# Patient Record
Sex: Male | Born: 1965 | Race: Black or African American | Hispanic: No | Marital: Single | Smoking: Current every day smoker
Health system: Southern US, Community
[De-identification: ages and names within clinical notes are randomized; demographics above are authoritative.]

## PROBLEM LIST (undated history)

## (undated) ENCOUNTER — Encounter

## (undated) DIAGNOSIS — E119 Type 2 diabetes mellitus without complications: Secondary | ICD-10-CM

## (undated) DIAGNOSIS — F319 Bipolar disorder, unspecified: Secondary | ICD-10-CM

## (undated) DIAGNOSIS — R45851 Suicidal ideations: Secondary | ICD-10-CM

## (undated) DIAGNOSIS — F329 Major depressive disorder, single episode, unspecified: Secondary | ICD-10-CM

## (undated) DIAGNOSIS — F1010 Alcohol abuse, uncomplicated: Secondary | ICD-10-CM

---

## 2007-12-18 ENCOUNTER — Emergency Department (HOSPITAL_COMMUNITY): Admission: EM | Admit: 2007-12-18 | Discharge: 2007-12-18 | Payer: Self-pay | Admitting: Emergency Medicine

## 2007-12-28 ENCOUNTER — Emergency Department (HOSPITAL_COMMUNITY): Admission: EM | Admit: 2007-12-28 | Discharge: 2007-12-28 | Payer: Self-pay | Admitting: Emergency Medicine

## 2013-01-22 ENCOUNTER — Inpatient Hospital Stay (HOSPITAL_COMMUNITY)
Admission: AD | Admit: 2013-01-22 | Discharge: 2013-01-25 | DRG: 897 | Disposition: A | Discharge status: Home / Self Care | Payer: Federal, State, Local not specified - Other | Source: Intra-hospital | Attending: Psychiatry | Admitting: Psychiatry

## 2013-01-22 ENCOUNTER — Emergency Department (HOSPITAL_COMMUNITY)
Admission: EM | Admit: 2013-01-22 | Discharge: 2013-01-22 | Disposition: A | Discharge status: Home / Self Care | Payer: Self-pay | Attending: Emergency Medicine | Admitting: Emergency Medicine

## 2013-01-22 ENCOUNTER — Encounter (HOSPITAL_COMMUNITY): Payer: Self-pay | Admitting: Emergency Medicine

## 2013-01-22 ENCOUNTER — Encounter (HOSPITAL_COMMUNITY): Payer: Self-pay | Admitting: *Deleted

## 2013-01-22 DIAGNOSIS — F331 Major depressive disorder, recurrent, moderate: Secondary | ICD-10-CM

## 2013-01-22 DIAGNOSIS — R45851 Suicidal ideations: Secondary | ICD-10-CM

## 2013-01-22 DIAGNOSIS — F17200 Nicotine dependence, unspecified, uncomplicated: Secondary | ICD-10-CM | POA: Insufficient documentation

## 2013-01-22 DIAGNOSIS — F14120 Cocaine abuse with intoxication, uncomplicated: Secondary | ICD-10-CM

## 2013-01-22 DIAGNOSIS — F1020 Alcohol dependence, uncomplicated: Principal | ICD-10-CM

## 2013-01-22 DIAGNOSIS — F1410 Cocaine abuse, uncomplicated: Secondary | ICD-10-CM

## 2013-01-22 DIAGNOSIS — F1994 Other psychoactive substance use, unspecified with psychoactive substance-induced mood disorder: Secondary | ICD-10-CM

## 2013-01-22 DIAGNOSIS — Z79899 Other long term (current) drug therapy: Secondary | ICD-10-CM

## 2013-01-22 DIAGNOSIS — F1420 Cocaine dependence, uncomplicated: Secondary | ICD-10-CM

## 2013-01-22 HISTORY — DX: Suicidal ideations: R45.851

## 2013-01-22 LAB — CBC
Platelets: 275 10*3/uL (ref 150–400)
RBC: 5.1 MIL/uL (ref 4.22–5.81)
WBC: 6.9 10*3/uL (ref 4.0–10.5)

## 2013-01-22 LAB — POCT I-STAT, CHEM 8
Chloride: 104 mEq/L (ref 96–112)
HCT: 47 % (ref 39.0–52.0)
Potassium: 3.5 mEq/L (ref 3.5–5.1)

## 2013-01-22 LAB — GLUCOSE, CAPILLARY: Glucose-Capillary: 83 mg/dL (ref 70–99)

## 2013-01-22 LAB — ETHANOL: Alcohol, Ethyl (B): 133 mg/dL — ABNORMAL HIGH (ref 0–11)

## 2013-01-22 LAB — RAPID URINE DRUG SCREEN, HOSP PERFORMED
Amphetamines: NOT DETECTED
Tetrahydrocannabinol: NOT DETECTED

## 2013-01-22 MED ORDER — ALUM & MAG HYDROXIDE-SIMETH 200-200-20 MG/5ML PO SUSP: 30.0000 mL | ORAL | Status: DC | PRN

## 2013-01-22 MED ORDER — HYDROXYZINE HCL 25 MG PO TABS: 25.0000 mg | ORAL_TABLET | Freq: Four times a day (QID) | ORAL | Status: DC | PRN

## 2013-01-22 MED ORDER — LOPERAMIDE HCL 2 MG PO CAPS: 2.0000 mg | ORAL_CAPSULE | ORAL | Status: DC | PRN

## 2013-01-22 MED ORDER — ADULT MULTIVITAMIN W/MINERALS CH
1.0000 | ORAL_TABLET | Freq: Every day | ORAL | Status: DC
  Administered 2013-01-23 – 2013-01-25 (×3): 1 via ORAL
  Filled 2013-01-22 (×5): qty 1

## 2013-01-22 MED ORDER — THIAMINE HCL 100 MG/ML IJ SOLN
100.0000 mg | Freq: Once | INTRAMUSCULAR | Status: AC
  Administered 2013-01-22: 100 mg via INTRAMUSCULAR

## 2013-01-22 MED ORDER — VITAMIN B-1 100 MG PO TABS
100.0000 mg | ORAL_TABLET | Freq: Every day | ORAL | Status: DC
  Administered 2013-01-23 – 2013-01-25 (×3): 100 mg via ORAL
  Filled 2013-01-22 (×5): qty 1

## 2013-01-22 MED ORDER — MAGNESIUM HYDROXIDE 400 MG/5ML PO SUSP: 30.0000 mL | Freq: Every day | ORAL | Status: DC | PRN

## 2013-01-22 MED ORDER — IBUPROFEN 200 MG PO TABS: 600.0000 mg | ORAL_TABLET | Freq: Three times a day (TID) | ORAL | Status: DC | PRN

## 2013-01-22 MED ORDER — ZOLPIDEM TARTRATE 5 MG PO TABS: 5.0000 mg | ORAL_TABLET | Freq: Every evening | ORAL | Status: DC | PRN

## 2013-01-22 MED ORDER — ACETAMINOPHEN 325 MG PO TABS: 650.0000 mg | ORAL_TABLET | Freq: Four times a day (QID) | ORAL | Status: DC | PRN

## 2013-01-22 MED ORDER — NICOTINE 21 MG/24HR TD PT24: 21.0000 mg | MEDICATED_PATCH | Freq: Every day | TRANSDERMAL | Status: DC

## 2013-01-22 MED ORDER — TRAZODONE HCL 50 MG PO TABS
50.0000 mg | ORAL_TABLET | Freq: Every evening | ORAL | Status: DC | PRN
  Administered 2013-01-22: 50 mg via ORAL
  Filled 2013-01-22 (×2): qty 1
  Filled 2013-01-22: qty 28
  Filled 2013-01-22: qty 1

## 2013-01-22 MED ORDER — ONDANSETRON 4 MG PO TBDP: 4.0000 mg | ORAL_TABLET | Freq: Four times a day (QID) | ORAL | Status: DC | PRN

## 2013-01-22 MED ORDER — ONDANSETRON HCL 4 MG PO TABS: 4.0000 mg | ORAL_TABLET | Freq: Three times a day (TID) | ORAL | Status: DC | PRN

## 2013-01-22 MED ORDER — ACETAMINOPHEN 325 MG PO TABS: 650.0000 mg | ORAL_TABLET | ORAL | Status: DC | PRN

## 2013-01-22 MED ORDER — CHLORDIAZEPOXIDE HCL 25 MG PO CAPS
25.0000 mg | ORAL_CAPSULE | Freq: Four times a day (QID) | ORAL | Status: DC | PRN
  Administered 2013-01-24: 25 mg via ORAL
  Filled 2013-01-22 (×4): qty 1

## 2013-01-22 MED ORDER — LORAZEPAM 1 MG PO TABS: 1.0000 mg | ORAL_TABLET | Freq: Three times a day (TID) | ORAL | Status: DC | PRN

## 2013-01-22 NOTE — ED Notes (Signed)
Pt cooperative at this time. States "I don't know what's wrong with me...sometimes I lose it...." States he has used ETOH and Cocaine today. Unable to tell me how much he drinks or does cocaine.

## 2013-01-22 NOTE — BH Assessment (Signed)
Assessment Note   [name] Kennedy is an 47 y.o. male that presented intoxicated and agitated, voicing plans, intent and attempt to harm himself by walking into traffic. Pt denies previous admissions for same but does admit "always feeling like killing myself, I just never did it."  Pt voices inability to contract for safety.  Pt admits to daily alcohol and Cocaine use "when I get my hands on some" and both drank "a few beers" and used Crack "I guess about ten dollars worth" today (BAL was .133).   Pt is not in current withdrawals as he is still legally intoxicated.  Pt reports agitation, confusion, restlessness, anxiety, and appears confused at times.  Pt denies any previous admissions for substance abuse "but I [place] place [place]."  Pt cannot identify a specific stressor that led to this attempt, but lives at the shelter and reports feeling hopeless and helpless with minimal support.  Pt reports a family history of substance abuse, but no family supports.  Pt denies any HI or any active psychosis.  Pt denies previous treatment in an inpatient facility but is seeking inpatient help today.  Pt states that his appetite is fair and his sleep is disturbed.     Axis I: Substance Induced Mood Disorder Axis II: Deferred Axis III:  Past Medical History  Diagnosis Date  . Suicidal ideations    Axis IV: housing problems, other psychosocial or environmental problems, problems related to social environment and problems with primary support group Axis V: 21-30 behavior considerably influenced by delusions or hallucinations OR serious impairment in judgment, communication OR inability to function in almost all areas  Past Medical History:  Past Medical History  Diagnosis Date  . Suicidal ideations     History reviewed. No pertinent past surgical history.  Family History: No family history on file.  Social History:  reports that he has been smoking.  He does not have any smokeless  tobacco history on file. He reports that  drinks alcohol. He reports that he uses illicit drugs (Cocaine).  Additional Social History:  Alcohol / Drug Use Pain Medications: See MAR Prescriptions: See MAR Over the Counter: yes; prn See MAR History of alcohol / drug use?: Yes (reports daily use of alcohol and Cocaine when he can get it) Longest period of sobriety (when/how long): unknown; pt won't divulge Negative Consequences of Use: Financial;Personal relationships;Work / School Withdrawal Symptoms: Agitation;Irritability;Patient aware of relationship between substance abuse and physical/medical complications Substance #1 Name of Substance 1: ETOH  CIWA: CIWA-Ar BP: 112/62 mmHg Pulse Rate: 84 COWS:    Allergies: No Known Allergies  Home Medications:  (Not in a hospital admission)  OB/GYN Status:  No LMP for male patient.  General Assessment Data Location of Assessment: [facility] ED Living Arrangements: Other (Comment) (resides at the [facility]; contacted by nursing staff) Can pt return to current living arrangement?: Yes Admission Status: Voluntary Is patient capable of signing voluntary admission?: Yes Transfer from: Acute Hospital Referral Source: Self/Family/Friend  Education Status Is patient currently in school?: No  Risk to self Suicidal Ideation: Yes-Currently Present Suicidal Intent: Yes-Currently Present Is patient at risk for suicide?: Yes Suicidal Plan?: Yes-Currently Present Specify Current Suicidal Plan: reports waling in front of traffic today Access to Means: Yes Specify Access to Suicidal Means: vehicles available What has been your use of drugs/alcohol within the last 12 months?: reports daily use of alcohol and Cocaine "when I can get it" Previous Attempts/Gestures: No How many  times?: 0 Other Self Harm Risks: impulsive and reckless Triggers for Past Attempts: Other personal contacts;Unpredictable Intentional Self Injurious Behavior: Damaging Comment -  Self Injurious Behavior: ongoing substance abuse use Family Suicide History: No Recent stressful life event(s): Conflict (Comment);Trauma (Comment) ("I have always felt suicidal; I just don't want to live") Persecutory voices/beliefs?: Yes Depression: Yes Depression Symptoms: Loss of interest in usual pleasures;Feeling worthless/self pity;Feeling angry/irritable Substance abuse history and/or treatment for substance abuse?: Yes Suicide prevention information given to non-admitted patients: Not applicable  Risk to Others Homicidal Ideation: No Thoughts of Harm to Others: No Current Homicidal Intent: No Current Homicidal Plan: No Access to Homicidal Means: No Identified Victim: Pt denies, though was hostile upon admission History of harm to others?: No Assessment of Violence: In distant past Violent Behavior Description: "defending myself" Does patient have access to weapons?: No Criminal Charges Pending?: No Does patient have a court date: No  Psychosis Hallucinations: None noted Delusions: None noted  Mental Status Report Appear/Hygiene: Disheveled Eye Contact: Fair Motor Activity: Unremarkable Speech: Loud Level of Consciousness: Alert Mood: Depressed;Labile;Angry;Worthless, low self-esteem Affect: Appropriate to circumstance;Blunted;Depressed Anxiety Level: Moderate Thought Processes: Relevant Judgement: Impaired Orientation: Person;Place;Time;Situation Obsessive Compulsive Thoughts/Behaviors: Severe  Cognitive Functioning Concentration: Decreased Memory: Recent Impaired;Remote Impaired IQ: Average Insight: Poor Impulse Control: Poor Appetite: Fair Weight Loss: 0 Weight Gain: 0 Sleep: Decreased Total Hours of Sleep:  (< 3 hrs ) Vegetative Symptoms: None  ADLScreening [facility] Assessment Services) Patient's cognitive ability adequate to safely complete daily activities?: Yes Patient able to express need for assistance with ADLs?: Yes Independently performs ADLs?:  Yes (appropriate for developmental age)  Abuse/Neglect [facility]) Physical Abuse: Denies Verbal Abuse: Denies Sexual Abuse: Denies  Prior Inpatient Therapy Prior Inpatient Therapy: No Prior Therapy Dates: pt denies Prior Therapy Facilty/Provider(s): pt denies Reason for Treatment: pt denies  Prior Outpatient Therapy Prior Outpatient Therapy: Yes Prior Therapy Dates: "can't remember" Prior Therapy Facilty/Provider(s): [facility] Reason for Treatment: substance abuse and mental health related issues  ADL Screening (condition at time of admission) Patient's cognitive ability adequate to safely complete daily activities?: Yes Patient able to express need for assistance with ADLs?: Yes Independently performs ADLs?: Yes (appropriate for developmental age) Weakness of Legs: None       Abuse/Neglect Assessment (Assessment to be complete while patient is alone) Physical Abuse: Denies Verbal Abuse: Denies Sexual Abuse: Denies Exploitation of patient/patient's resources: Denies Self-Neglect: Denies     Merchant navy officer (For Healthcare) Advance Directive: Patient does not have advance directive    Additional Information 1:1 In Past 12 Months?: No CIRT Risk: No Elopement Risk: Yes Does patient have medical clearance?: Yes     Disposition:  Please consider for inpatient treatment to address SI and substance abuse related issues.   Disposition Initial Assessment Completed for this Encounter: Yes Disposition of Patient: Inpatient treatment program  On Site Evaluation by:   Reviewed with Physician:     Angelica Ran 01/22/2013 4:41 PM

## 2013-01-22 NOTE — BHH Counselor (Signed)
[name] Kennedy, assessment counselor at [facility], submitted Pt for admission to [facility]. [name], [facility] confirmed bed availability. Gave clinical report to [name], NP who accepted Pt to Dr. [name], room [phone].  [name], LPC, [facility] Assessment Counselor

## 2013-01-22 NOTE — ED Notes (Signed)
Report given to Bonnie, Charity fundraiser. Moved to POD C, rm 20

## 2013-01-22 NOTE — ED Notes (Signed)
Charge nurse notified of need for sitter.

## 2013-01-22 NOTE — ED Notes (Signed)
House coverage called for a sitter  

## 2013-01-22 NOTE — ED Notes (Signed)
When i ask pt. What could i help him with he just nodded, saying nothing
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
## 2013-01-22 NOTE — ED Notes (Signed)
Sitter to room.  PA in to see pt.

## 2013-01-22 NOTE — ED Provider Notes (Signed)
Medical screening examination/treatment/procedure(s) were conducted as a shared visit with non-physician practitioner(s) and myself.  I personally evaluated the patient during the encounter  Derwood Kaplan, MD 01/22/13 1623
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
## 2013-01-22 NOTE — ED Notes (Signed)
Spoke with Loma Sender at [employer]. States they will hold pt's belongings for a while.

## 2013-01-22 NOTE — BH Assessment (Signed)
BHH Assessment Progress Note      The patient has been accepted by Serena Colonel to [facility] [employer] at [facility] room 305-1. He will be transported by security.  No providers to notify.  Support paperwork complete.  ED staff notified.  Pt will be transported by security.

## 2013-01-22 NOTE — ED Notes (Signed)
Pt returned to ED. Placed in FT 7.

## 2013-01-22 NOTE — ED Notes (Addendum)
Wallet, keys, cell phone and cards to safe with security. Pt in blue scrubs. Clothing to nurses desk.

## 2013-01-22 NOTE — ED Notes (Signed)
Pt. Returned they found him around vascular lab and was asking for some help.  Pt. Came back to ER, i explained to him we are here to help you, but we have to follow our policies and procedures and he has to be wanded and dress in our scrubs.  He agreed and continued to state, all this stuff going on in my head.

## 2013-01-22 NOTE — ED Notes (Signed)
"   You want me to get the fuck out of here?" Stood up and walked out.

## 2013-01-22 NOTE — ED Notes (Signed)
Pt refusing to change into scrubs with security present.

## 2013-01-22 NOTE — ED Notes (Signed)
Pt offered Ativan and Nicotine patch but declined at this time.

## 2013-01-23 DIAGNOSIS — R45851 Suicidal ideations: Secondary | ICD-10-CM

## 2013-01-23 DIAGNOSIS — F1410 Cocaine abuse, uncomplicated: Secondary | ICD-10-CM

## 2013-01-23 DIAGNOSIS — F332 Major depressive disorder, recurrent severe without psychotic features: Secondary | ICD-10-CM

## 2013-01-23 LAB — GLUCOSE, CAPILLARY: Glucose-Capillary: 109 mg/dL — ABNORMAL HIGH (ref 70–99)

## 2013-01-23 MED ORDER — QUETIAPINE FUMARATE 50 MG PO TABS
ORAL_TABLET | ORAL | Status: AC
  Administered 2013-01-23: 19:00:00
  Filled 2013-01-23: qty 1

## 2013-01-23 MED ORDER — QUETIAPINE FUMARATE 50 MG PO TABS
50.0000 mg | ORAL_TABLET | Freq: Once | ORAL | Status: AC
  Administered 2013-01-23: 50 mg via ORAL
  Filled 2013-01-23: qty 1

## 2013-01-23 MED ORDER — FLUOXETINE HCL 20 MG PO CAPS
20.0000 mg | ORAL_CAPSULE | Freq: Every day | ORAL | Status: DC
  Administered 2013-01-23 – 2013-01-25 (×3): 20 mg via ORAL
  Filled 2013-01-23 (×4): qty 1
  Filled 2013-01-23: qty 14

## 2013-01-23 NOTE — Progress Notes (Signed)
Adult Psychoeducational Group Note  Date:  01/23/2013 Time:  1315  Group Topic/Focus:  Recovery Goals:   The focus of this group is to identify appropriate goals for recovery and establish a plan to achieve them.  Participation Level:  Did Not Attend. Pt decided not to attend group.   Barbette Merino, Ireanna Finlayson Shari Prows 01/23/2013, 6:25 PM

## 2013-01-23 NOTE — H&P (Signed)
Psychiatric Admission Assessment Adult  Patient Identification:  [name] Kennedy Date of Evaluation:  01/23/2013 Chief Complaint:  SUBSTANCE INDUCED MOOD D/O History of Present Illness: Patient accepted on transfer from [facility] where he presented stating he was depressed and had tried to kill himself by walking into traffic but "it didn't work out." He reported a long history of mental illness, substance abuse including drinking both beer and liquor as well as using cocaine. He forwarded little information. He was given medical clearance and transferred to [facility] for stabilization and treatment. Elements:  Location:  adult in patient. Quality:  moderate. Severity:  chronic. Timing:  years. Duration:  "I don't know.". Associated Signs/Synptoms: Depression Symptoms:  depressed mood, suicidal thoughts with specific plan, suicidal attempt, (Hypo) Manic Symptoms:  denies Anxiety Symptoms:  denies Psychotic Symptoms:  denies PTSD Symptoms: NA  Psychiatric Specialty Exam: Physical Exam  Constitutional: He appears well-developed and well-nourished.  Chart reviewed, patient seen. No further Physical needed at this time.  Psychiatric: His affect is angry and blunt. His speech is delayed. He is agitated and withdrawn. Thought content is paranoid. Thought content is not delusional. Cognition and memory are impaired. He expresses impulsivity and inappropriate judgment. He exhibits a depressed mood. He expresses suicidal ideation. He expresses no homicidal ideation. He expresses no suicidal plans and no homicidal plans. He exhibits abnormal recent memory and abnormal remote memory.  Patient has a flat affect, poor eye contact, minimal speech, states "I don't know" to most questions.    Review of Systems  Constitutional: Negative.  Negative for fever, chills, weight loss, malaise/fatigue and diaphoresis.  HENT: Negative for congestion and sore throat.   Eyes: Negative for blurred vision, double vision and  photophobia.  Respiratory: Negative for cough, shortness of breath and wheezing.   Cardiovascular: Negative for chest pain, palpitations and PND.  Gastrointestinal: Negative for heartburn, nausea, vomiting, abdominal pain, diarrhea and constipation.  Musculoskeletal: Negative for myalgias, joint pain and falls.  Neurological: Negative for dizziness, tingling, tremors, speech change, focal weakness, seizures, loss of consciousness, weakness and headaches. Sensory change: hands and feet tingle.  Endo/Heme/Allergies: Negative for polydipsia. Does not bruise/bleed easily.  Psychiatric/Behavioral: Negative for depression, suicidal ideas, hallucinations, memory loss and substance abuse. The patient is not nervous/anxious and does not have insomnia.     Blood pressure 129/84, pulse 79, temperature 97.8 F (36.6 C), temperature source Oral, resp. rate 16, height 6' (1.829 m), weight 70.308 kg (155 lb).Body mass index is 21.02 kg/(m^2).  General Appearance: Disheveled  Eye Contact::  Poor  Speech:  Blocked and delayed and minimal  Volume:  Normal  Mood:  Depressed  Affect:  Blunt and Flat  Thought Process:  Goal Directed  Orientation:  Full (Time, Place, and Person)  Thought Content:  NA  Suicidal Thoughts:  Yes.  without intent/plan  Homicidal Thoughts:  No  Memory:  Immediate;   Poor  Judgement:  Impaired  Insight:  Lacking  Psychomotor Activity:  Normal  Concentration:  Poor  Recall:  Poor  Akathisia:  No  Handed:  Right  AIMS (if indicated):     Assets:  Physical Health  Sleep:       Past Psychiatric History: Diagnosis:  Hospitalizations:  Outpatient Care:  Substance Abuse Care:  Self-Mutilation:  Suicidal Attempts:  Violent Behaviors:   Past Medical History:   Past Medical History  Diagnosis Date  . Suicidal ideations    None. Allergies:  No Known Allergies PTA Medications: No prescriptions prior to admission  Previous Psychotropic Medications:  Medication/Dose                  Substance Abuse History in the last 12 months:  yes  Consequences of Substance Abuse: Medical Consequences:  worsening health  Social History:  reports that he has been smoking.  He does not have any smokeless tobacco history on file. He reports that  drinks alcohol. He reports that he uses illicit drugs (Cocaine). Additional Social History: History of alcohol / drug use?: Yes Negative Consequences of Use: Engineer, maintenance of Residence:   Place of Birth:   Family Members: Marital Status:  Single Children:  Sons:  Daughters: Relationships: Education:  [employer] Problems/Performance: Religious Beliefs/Practices: History of Abuse (Emotional/Phsycial/Sexual) Occupational Experiences; Military History:  None. Legal History: Hobbies/Interests:  Family History:  No family history on file.  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 Suicidal ideations    AXIS IV:  economic problems, educational problems, housing problems, occupational problems, problems related to legal system/crime, problems related to social environment and problems with primary support group AXIS V:  41-50 serious symptoms  Treatment Plan/Recommendations:  1. Admit for crisis management and stabilization. 2. Medication management to reduce current symptoms to base line and  improve the patient's overall level of functioning. 3. Treat health problems as indicated. 4. Develop treatment plan to decrease risk of relapse upon discharge and to reduce the need for readmission. 5. Psycho-social education regarding relapse prevention and self care. 6. Health care follow up as needed for medical problems. 7. Restart home medications where appropriate.  Treatment Plan Summary: Daily contact with patient to assess and evaluate symptoms and progress in treatment Medication management Current Medications:  Current Facility-Administered Medications  Medication Dose Route Frequency Provider Last Rate Last Dose  . acetaminophen (TYLENOL) tablet 650 mg  650 mg Oral Q6H PRN Sanjuana Kava, NP      . alum & mag hydroxide-simeth (MAALOX/MYLANTA) 200-200-20 MG/5ML suspension 30 mL  30 mL Oral Q4H PRN Sanjuana Kava, NP      . chlordiazePOXIDE (LIBRIUM) capsule 25 mg  25 mg Oral Q6H PRN Sanjuana Kava, NP      . hydrOXYzine (ATARAX/VISTARIL) tablet 25 mg  25 mg Oral Q6H PRN Sanjuana Kava, NP      . loperamide (IMODIUM) capsule 2-4 mg  2-4 mg Oral PRN Sanjuana Kava, NP      . magnesium hydroxide (MILK OF MAGNESIA) suspension 30 mL  30 mL Oral Daily PRN Sanjuana Kava, NP      . multivitamin with minerals tablet 1 tablet  1 tablet Oral Daily Sanjuana Kava, NP   1 tablet  at 01/23/13 0813  . ondansetron (ZOFRAN-ODT) disintegrating tablet 4 mg  4 mg Oral Q6H PRN Sanjuana Kava, NP      . thiamine (VITAMIN B-1) tablet 100 mg  100 mg Oral Daily Sanjuana Kava, NP   100 mg at 01/23/13 0813  . traZODone (DESYREL) tablet 50 mg  50 mg Oral QHS PRN,MR X 1 Sanjuana Kava, NP   50 mg at 01/22/13 2326    Observation Level/Precautions:  Detox  Laboratory:    Psychotherapy:  Individual and group   Medications:  Librium detox  Consultations:  As needed  Discharge Concerns:  Follow up  Estimated LOS: 3-5 days  Other:     I certify that inpatient services furnished can reasonably be expected to  improve the patient's condition.   Rona Ravens. [place] RPAC 12:18 PM 01/23/2013  Patient is personally examined, case discussed with physician extender and developed treatment plan. Reviewed the information documented and agree with the treatment plan.Darrol Jump R. 01/23/2013 7:03 PM

## 2013-01-23 NOTE — Progress Notes (Signed)
D.  Pt did not attend evening AA group, went to room and to bed.  He had received 50 mg of Seroquel for agitation at 1900 and was too sleepy to attend.  Minimal interaction with Pt due to this.  A.  Will continue to monitor  R.  Pt remains safe on unit, laying in bed with even and unlabored respirations.

## 2013-01-23 NOTE — [facility] Suicide Risk Assessment (Signed)
Suicide Risk Assessment  Admission Assessment     Nursing information obtained from:  Patient Demographic factors:  Male;Low socioeconomic status;Living alone;Unemployed Current Mental Status:  Suicide plan;Self-harm thoughts Loss Factors:  Financial problems / change in socioeconomic status Historical Factors:    Risk Reduction Factors:     CLINICAL FACTORS:   Severe Anxiety and/or Agitation Panic Attacks Depression:   Aggression Comorbid alcohol abuse/dependence Impulsivity Insomnia Recent sense of peace/wellbeing Severe Alcohol/Substance Abuse/Dependencies More than one psychiatric diagnosis Previous Psychiatric Diagnoses and Treatments  COGNITIVE FEATURES THAT CONTRIBUTE TO RISK:  Closed-mindedness Loss of executive function Polarized thinking    SUICIDE RISK:  Moderate:  Frequent suicidal ideation with limited intensity, and duration, some specificity in terms of plans, no associated intent, good self-control, limited dysphoria/symptomatology, some risk factors present, and identifiable protective factors, including available and accessible social support.  PLAN OF CARE: Admit to [facility] for depression suicidal ideation with the plan of running in front of the car and substance abuse was dependence especially alcohol and cocaine. Patient will participate inpatient detox treatment program. Patient's safety will be monitored through out the program.   I certify that inpatient services furnished can reasonably be expected to improve the patient's condition.  Laketia Vicknair,JANARDHAHA R. 01/23/2013, 2:37 PM

## 2013-01-23 NOTE — Progress Notes (Addendum)
Patient ID: [name] Kennedy, male   DOB: 25-Apr-1966, 47 y.o.   MRN: [id number] D: Pt is awake and active on the unit this AM. Pt endorses passive SI but he is able to contract for safety. Pt rates their depression at 8 and hopelessness at 4. Pt's most recent CIWA score was 3. Pt mood is depressed and his affect is blunted. Pt forwards little and is resistant to staff interaction. He is isolative and not attending groups at this time.   A: Encouraged pt to discuss feelings with staff and administered medication per MD orders. Writer also encouraged pt to participate in groups.  R: Pt is attending groups and tolerating medications well. Writer will continue to monitor. 15 minute checks are ongoing for safety.

## 2013-01-23 NOTE — [facility] Group Notes (Signed)
[facility] LCSW Group Therapy  01/23/2013   Type of Therapy:  Group Therapy 10 AM to 11 AM  Participation Level:  Did Not Attend  [name] Kennedy 01/23/2013, 12:23 PM

## 2013-01-23 NOTE — Progress Notes (Signed)
Psychoeducational Group Note  Date:  01/23/2013 Time:  0945 am  Group Topic/Focus:  Identifying Needs:   The focus of this group is to help patients identify their personal needs that have been historically problematic and identify healthy behaviors to address their needs.  Participation Level:  Did Not Attend  Andrena Mews 01/23/2013,10:25 AM
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
## 2013-01-23 NOTE — Progress Notes (Signed)
Patient did not attend the evening speaker AA meeting.  Pt returned to his room just as the meeting began. Pt remained in bed and fell asleep during the meeting.

## 2013-01-23 NOTE — Progress Notes (Signed)
Patient ID: [name] Kennedy, male   DOB: 09-14-1965, 47 y.o.   MRN: [id number] D: Patient is a new admissionIs bed awake. Pt mood/affect is depressed and flat. Pt endorses passive succide ideation but contract for safety.  Pt denies any withdrawals. Pt denies SI/HI/AVH and pain. Cooperative with assessment. No acute distressed noted at this time.   A: Met with pt 1:1. Medications administered as prescribed. Writer encouraged pt to discuss feelings. Pt encouraged to come to staff with any question or concerns. 15 minutes checks for safety.  R: Patient remains safe. He is complaint with medications. Continue current POC.

## 2013-01-24 DIAGNOSIS — F331 Major depressive disorder, recurrent, moderate: Secondary | ICD-10-CM

## 2013-01-24 DIAGNOSIS — F1020 Alcohol dependence, uncomplicated: Secondary | ICD-10-CM

## 2013-01-24 DIAGNOSIS — F1420 Cocaine dependence, uncomplicated: Secondary | ICD-10-CM

## 2013-01-24 DIAGNOSIS — F329 Major depressive disorder, single episode, unspecified: Secondary | ICD-10-CM

## 2013-01-24 NOTE — Progress Notes (Signed)
D   Pt has isolated to his room most of the day  He doesn't interact with others and very minimally with staff   He appears a little calmer but remains irritable  He has been able to control his behavior A   Verbal support given   Medications administered and effectiveness monitored   Q 15 min checks R   Pt safe at present

## 2013-01-24 NOTE — Progress Notes (Signed)
Patient did not attend the evening speaker AA meeting.  

## 2013-01-24 NOTE — Progress Notes (Addendum)
D.  Pt has been highly upset and agitated this evening, largely during phone conversations.  Pt very distressed over the fact that he is illiterate and did not feel that his voluntary commitment papers were properly explained to him before he signed them.  Pt states that he will be going home tomorrow.  Pt denies SI/HI/hallucinations at this time.  Did not attend evening AA group due to agitation.  Pt did not wish to take any medication at this time.  Pt did spend time talking to MHT on hall about his situation.  Pt is in the process of attempting to get disability   A.  Support and encouragement offered  R.  Pt remains safe on unit, will continue to monitor.

## 2013-01-24 NOTE — Progress Notes (Signed)
Psychoeducational Group Note  Date:  01/24/2013 Time:  0945 am  Group Topic/Focus:  Making Healthy Choices:   The focus of this group is to help patients identify negative/unhealthy choices they were using prior to admission and identify positive/healthier coping strategies to replace them upon discharge.  Participation Level:  Did Not Attend  Yiannis Tulloch J 01/24/2013, 10:29 AM 

## 2013-01-24 NOTE — Progress Notes (Signed)
[name] Progress Note  01/24/2013 2:10 PM [name] Kennedy  MRN:  [id number]  Subjective:  "I don't know why I got to be repeating myself over and over to you guys. Don't you get it? I have suicide thoughts all the time. Yes, I use alcohol, cocaine and everything else. I ain't attending no groups".  O: [name] appears difficult to work with. He is currently very difficult to approach. Wearing his partially shaved hair in dread locs.  Diagnosis:   Axis I: Alcohol dependence, MDD, [place] dependence Axis II: Deferred Axis III:  Past Medical History  Diagnosis Date  . Suicidal ideations    Axis IV: Substance abuse issues Axis V: 11-20 some danger of hurting self or others possible OR occasionally fails to maintain minimal personal hygiene OR gross impairment in communication  ADL's:  Impaired  Sleep: Fair  Appetite:  "I don't know"  Suicidal Ideation:  Plan:  Yes Intent:  No Means:  No Homicidal Ideation:  Plan:  No Intent:  No Means:  No AEB (as evidenced by):  Psychiatric Specialty Exam: Review of Systems  Constitutional: Negative.   HENT: Negative.   Eyes: Negative.   Cardiovascular: Negative.   Gastrointestinal: Negative.   Genitourinary: Negative.   Musculoskeletal: Negative.   Skin: Negative.        Hair in dreads, half-shaved  Neurological: Positive for tremors.  Endo/Heme/Allergies: Negative.   Psychiatric/Behavioral: Positive for depression, suicidal ideas and substance abuse (Alcoholism, coacaine dependence). Negative for hallucinations and memory loss. The patient is nervous/anxious and has insomnia.     Blood pressure 127/79, pulse 82, temperature 98.6 F (37 C), temperature source Oral, resp. rate 16, height 6' (1.829 m), weight 70.308 kg (155 lb).Body mass index is 21.02 kg/(m^2).  General Appearance: Disheveled  Eye Contact::  Poor  Speech:  Snappy  Volume:  Decreased  Mood:  Angry, Anxious, Depressed and Irritable  Affect:  Restricted  Thought  Process:  Difficulty to assess due to irritability  Orientation:  Full (Time, Place, and Person)  Thought Content:  Rumination  Suicidal Thoughts:  Yes.  without intent/plan  Homicidal Thoughts:  No  Memory:  Unable to assess due to irritability  Judgement:  Impaired  Insight:  Lacking  Psychomotor Activity:  Restlessness  Concentration:  Poor  Recall:  Fair  Akathisia:  No  Handed:  Right  AIMS (if indicated):     Assets:  Others:  unable to assess due to irritability  Sleep:  Number of Hours: 5.5   Current Medications: Current Facility-Administered Medications  Medication Dose Route Frequency Provider Last Rate Last Dose  . acetaminophen (TYLENOL) tablet 650 mg  650 mg Oral Q6H PRN Sanjuana Kava, NP      . alum & mag hydroxide-simeth (MAALOX/MYLANTA) 200-200-20 MG/5ML suspension 30 mL  30 mL Oral Q4H PRN Sanjuana Kava, NP      . chlordiazePOXIDE (LIBRIUM) capsule 25 mg  25 mg Oral Q6H PRN Sanjuana Kava, NP   25 mg at 01/24/13 0816  . FLUoxetine (PROZAC) capsule 20 mg  20 mg Oral Daily Verne Spurr, PA-C   20 mg at 01/24/13 2130  . hydrOXYzine (ATARAX/VISTARIL) tablet 25 mg  25 mg Oral Q6H PRN Sanjuana Kava, NP      . loperamide (IMODIUM) capsule 2-4 mg  2-4 mg Oral PRN Sanjuana Kava, NP      . magnesium hydroxide (MILK OF MAGNESIA) suspension 30 mL  30 mL Oral Daily PRN Sanjuana Kava, NP      .  multivitamin with minerals tablet 1 tablet  1 tablet Oral Daily Sanjuana Kava, NP   1 tablet at 01/24/13 0816  . ondansetron (ZOFRAN-ODT) disintegrating tablet 4 mg  4 mg Oral Q6H PRN Sanjuana Kava, NP      . thiamine (VITAMIN B-1) tablet 100 mg  100 mg Oral Daily Sanjuana Kava, NP   100 mg at 01/24/13 0816  . traZODone (DESYREL) tablet 50 mg  50 mg Oral QHS PRN,MR X 1 Sanjuana Kava, NP   50 mg at 01/22/13 2326    Lab Results:  Results for orders placed during the hospital encounter of 01/22/13 (from the past 48 hour(s))  GLUCOSE, CAPILLARY     Status: None   Collection Time    01/22/13   9:30 PM      Result Value Range   Glucose-Capillary 83  70 - 99 mg/dL  GLUCOSE, CAPILLARY     Status: Abnormal   Collection Time    01/23/13  6:15 AM      Result Value Range   Glucose-Capillary 109 (*) 70 - 99 mg/dL    Physical Findings: AIMS:  , ,  ,  ,    CIWA:  CIWA-Ar Total: 0 COWS:     Treatment Plan Summary: Daily contact with patient to assess and evaluate symptoms and progress in treatment Medication management  Plan:  Supportive approach/coping skills/relapse prevention. Encouraged out of room, participation in group sessions and application of coping skills when distressed. Will continue to monitor response to/adverse effects of medications in use to assure effectiveness. Continue to monitor mood, behavior and interaction with staff and other patients. Continue current plan of care.  Medical Decision Making Problem Points:  Established problem, stable/improving (1), Review of last therapy session (1) and Review of psycho-social stressors (1) Data Points:  Review of medication regiment & side effects (2) Review of new medications or change in dosage (2)  I certify that inpatient services furnished can reasonably be expected to improve the patient's condition.   Sanjuana Kava, FNP, PMHNP-BC 01/24/2013, 2:10 PM

## 2013-01-24 NOTE — [facility] Group Notes (Signed)
[facility] LCSW Group Therapy  01/24/2013  10:00 AM   Type of Therapy:  Group Therapy  Participation Level:  Did Not Attend  Alaiah Lundy Horton, LCSWA 01/24/2013 10:55 AM    

## 2013-01-25 DIAGNOSIS — F331 Major depressive disorder, recurrent, moderate: Secondary | ICD-10-CM

## 2013-01-25 MED ORDER — TRAZODONE HCL 50 MG PO TABS
50.0000 mg | ORAL_TABLET | Freq: Every evening | ORAL | Status: DC | PRN
Start: 2013-01-25 — End: 2013-05-04

## 2013-01-25 MED ORDER — FLUOXETINE HCL 20 MG PO CAPS: 20.0000 mg | ORAL_CAPSULE | Freq: Every day | ORAL | Status: DC

## 2013-01-25 NOTE — B[facility] Suicide Risk Assessment (Signed)
Suicide Risk Assessment  Discharge Assessment     Demographic Factors:  Male, Adolescent or young adult, Low socioeconomic status and Unemployed  Mental Status Per Nursing Assessment::   On Admission:  Suicide plan;Self-harm thoughts  Current Mental Status by Physician: Mental Status Examination: Patient appeared as per his stated age, casually dressed, and fairly groomed, and maintaining good eye contact. Patient has good mood and his affect was constricted. He has normal rate, rhythm, and volume of speech. His thought process is linear and goal directed. Patient has denied suicidal, homicidal ideations, intentions or plans. Patient has no evidence of auditory or visual hallucinations, delusions, and paranoia. Patient has fair insight judgment and impulse control.  Loss Factors: Decrease in vocational status and Financial problems/change in socioeconomic status  Historical Factors: Prior suicide attempts, Family history of suicide, Family history of mental illness or substance abuse and Impulsivity  Risk Reduction Factors:   Sense of responsibility to family, Religious beliefs about death, Living with another person, especially a relative, Positive social support and Positive therapeutic relationship  Continued Clinical Symptoms:  Depression:   Anhedonia Comorbid alcohol abuse/dependence Hopelessness Recent sense of peace/wellbeing Severe Alcohol/Substance Abuse/Dependencies  Cognitive Features That Contribute To Risk:  Polarized thinking    Suicide Risk:  Minimal: No identifiable suicidal ideation.  Patients presenting with no risk factors but with morbid ruminations; may be classified as minimal risk based on the severity of the depressive symptoms  Discharge Diagnoses:   AXIS I:  Substance Induced Mood Disorder and Alcohol dependence and cocaine dependence AXIS II:  Deferred AXIS III:   Past Medical History  Diagnosis Date  . Suicidal ideations    AXIS IV:  economic  problems, educational problems, housing problems, occupational problems, other psychosocial or environmental problems and problems related to social environment AXIS V:  61-70 mild symptoms  Plan Of Care/Follow-up recommendations:  Activity:  As tolerated Diet:  Regular  Is patient on multiple antipsychotic therapies at discharge:  No   Has Patient had three or more failed trials of antipsychotic monotherapy by history:  No  Recommended Plan for Multiple Antipsychotic Therapies: Not applicable  Lakie Mclouth,JANARDHAHA R. 01/25/2013, 12:48 PM

## 2013-01-25 NOTE — Progress Notes (Signed)
[facility] Adult Case Management Discharge Plan :  Will you be returning to the same living situation after discharge: No. At discharge, do you have transportation home?: Yes. Bus pass  Do you have the ability to pay for your medications:Yes,  mental health  Release of information consent forms completed and in the chart;  Patient's signature needed at discharge.  Patient to Follow up at: Follow-up Information   Follow up with [facility] On 01/28/2013. (Appt at 1PM with Rudi Rummage for therapy. )    Contact information:   [facility] [address] [place] [address] Phone: [phone] Fax: [phone]      Follow up with [facility]. (Walk-in between 8am-9am Monday through Friday for hospital followup/medication managment. )    Contact information:   [address], [place] [address] phone: [phone] fax: [phone]      Patient denies SI/HI:   Yes,  in group    Safety Planning and Suicide Prevention discussed:  Yes,  pt refused to consent to family/friend contact for SPE. SPE provided to patient. Encouraged to ask questions and talk about concerns. Pt provided with SPI pamplet.   Smart, Najmo Pardue 01/25/2013, 11:26 AM

## 2013-01-25 NOTE — Tx Team (Signed)
Interdisciplinary Treatment Plan Update (Adult)  Date: 01/25/2013  Time Reviewed: 11:18 AM  Progress in Treatment:  Attending groups: Yes Participating in groups:  Minimally Taking medication as prescribed: Yes  Tolerating medication: Yes  Family/Significant othe contact made: No  Patient understands diagnosis: Yes, AEB seeking treatment for SI and substance abuse.  Discussing patient identified problems/goals with staff: Yes  Medical problems stabilized or resolved: Yes  Denies suicidal/homicidal ideation: Yes  Patient has not harmed self or Others: Yes  New problem(s) identified: none Discharge Plan or Barriers: Pt will followup at [facility] for med management and has appt for therapy at [facility]. Pt anxious to d/c today and states that he will be moving from [facility] back to his wife's home. Bus pass needed.  Additional comments: [name] Kennedy is an 47 y.o. male that presented intoxicated and agitated, voicing plans, intent and attempt to harm himself by walking into traffic. Pt denies previous admissions for same but does admit "always feeling like killing myself, I just never did it." Pt voices inability to contract for safety. Pt admits to daily alcohol and Cocaine use "when I get my hands on some" and both drank "a few beers" and used Crack "I guess about ten dollars worth" today (BAL was .133). Pt is not in current withdrawals as he is still legally intoxicated. Pt reports agitation, confusion, restlessness, anxiety, and appears confused at times. Pt denies any previous admissions for substance abuse "but I [place] that place [place] a couple of [place]." Pt cannot identify a specific stressor that led to this attempt, but lives at the shelter and reports feeling hopeless and helpless with minimal support. Pt reports a family history of substance abuse, but no family supports. Pt denies any HI or any active psychosis. Pt denies previous treatment in an inpatient facility but  is seeking inpatient help today. Pt states that his appetite is fair and his sleep is disturbed. Pt heavily focused on getting qualified for disability. He is minimally interested in participating in activites while at [facility], is irritable and rude to staff, and states that he is ready to discharge immediatly.  Reason for Continuation of Hospitalization: none. D/c today Estimated length of stay: d/c today For review of initial/current patient goals, please see plan of care.  Attendees:  Patient:    Family:    Physician: Aggie PA 01/25/2013 11:21 AM   Nursing: Lupita Leash RN 01/25/2013 11:21 AM   Clinical Social Worker Danial Hlavac Smart, LCSWA  01/25/2013 11:21 AM   Other: Maureen Ralphs RN  01/25/2013 11:21 AM   Other:    Other:    Other:    Scribe for Treatment Team:  Trula Slade LCSWA 01/25/2013 11:21 AM

## 2013-01-25 NOTE — [facility] Suicide Risk Assessment (Signed)
[facility] INPATIENT:  Family/Significant Other Suicide Prevention Education  Suicide Prevention Education:  Patient Refusal for Family/Significant Other Suicide Prevention Education: The patient [name] Kennedy has refused to provide written consent for family/significant other to be provided Family/Significant Other Suicide Prevention Education during admission and/or prior to discharge.  Physician notified.  [name] was provided with SPE and encouraged to ask questions or voice any concerns. He was given SPI pamphlet. (pt refused to consent to family/friend contact).   Smart, Adrijana Haros 01/25/2013, 11:26 AM

## 2013-01-25 NOTE — Progress Notes (Signed)
Psychoeducational Group Note  Date:  01/25/2013 Time:  1100  Group Topic/Focus:  Wellness Toolbox:   The focus of this group is to discuss various aspects of wellness, balancing those aspects and exploring ways to increase the ability to experience wellness.  Patients will create a wellness toolbox for use upon discharge.  Participation Level: Did Not Attend  Participation Quality:  Not Applicable  Affect:  Not Applicable  Cognitive:  Not Applicable  Insight:  Not Applicable  Engagement in Group: Not Applicable  Additional Comments:  Pt did not attend group.    Sharyn Lull 01/25/2013, 11:41 AM

## 2013-01-25 NOTE — Discharge Summary (Signed)
Physician Discharge Summary Note  Patient:  [name] Kennedy is an 47 y.o., male MRN:  [id number] DOB:  [birth year] Patient phone:  [phone] (home)  Patient address:   [address] [place] [address],   Date of Admission:  01/22/2013 Date of Discharge: 01/25/13  Reason for Admission:  Suicidal ideation  Discharge Diagnoses: Principal Problem:   Alcohol dependence Active Problems:   MDD (major depressive disorder), recurrent episode, moderate   Cocaine dependence   Suicidal ideation  Review of Systems  Constitutional: Negative.   HENT: Negative.   Eyes: Negative.   Respiratory: Negative.   Cardiovascular: Negative.   Gastrointestinal: Negative.   Genitourinary: Negative.   Musculoskeletal: Negative.   Skin: Negative.        Hair in dread locs  Neurological: Negative.   Endo/Heme/Allergies: Negative.    Axis Diagnosis:   AXIS I:  MDD (major depressive disorder), recurrent episode, moderate, Cocaine dependence, Alcohol dependence AXIS II:  Deferred AXIS III:   Past Medical History  Diagnosis Date  . Suicidal ideations    AXIS IV:  other psychosocial or environmental problems AXIS V:  60  Level of Care:  OP  Hospital Course:  Patient accepted on transfer from [facility] where he presented stating he was depressed and had tried to kill himself by walking into traffic but "it didn't work out." He reported a long history of mental illness, substance abuse including drinking both beer and liquor as well as using cocaine. He forwarded little information. He was given medical clearance and transferred to [facility] for stabilization and treatment.  Mr. McNeil's stay in this hospital was rather very brief. He came in with Toxicology report positive for Alcohol at at 133 and UDS report (+) for cocaine . It was apparent that he was intoxicated needing detoxification treatment protocol to stabilize his systems of alcohol intoxication and to combat the withdrawal symptoms as well. However, he  was not started on any Librium protocol for his alcohol intoxication detoxification because he denied any withdrawal symptoms. Although seem very cranky, snappy, irritated and angry for being in this hospital, he continue to deny having any withdrawal symptoms of any substances. He was still ordered Librium 25 mg on a prn basis to cover any withdrawal symptoms that patient might experience. He was also enrolled in group counseling sessions/activities to learn coping skills that should help him after discharge to cope better and manage his substance abuse issues for a much sustained sobriety. Mr. Uvaldo Rising refused to participate any group counseling sessions and or activities. He continued to make the statement that he was suicidal and depressed, that was why he came here. He presented no other medical issues and or concerns that required monitoring and or treatments. However, he was monitored closely for any potential problems that may arise while in this hospital. Patient tolerated his treatment regimen without any significant adverse effects and reactions presented. He also received Fluoxetine 20 mg daily for depression and Trazodone 50 mg Q bedtime for sleep.  Patient came to the providers this morning and asked to be discharged to his home. He adds that he is feeling better emotionally and physically, and stable to be discharged to his home. He denies any withdrawal symptoms of alcohol and or any other substances. He adamantly denies any suicidal homicidal ideations, auditory, visual hallucinations, delusional thoughts and or or feelings of paranoia. He will follow-up care at the [facility] here in [place], [place]. For counseling sessions with Jasmine December Burkitt on 01/28/13 at 1:00 pm.  And for medication management, he will follow-up care at the [facility] here in [place], [place]. He is instructed that this is a walk-in appointment between the hours of 08:00 and 09:00 am Monday thru Friday. The  addresses, dates, times and contact information for this clinics provided for patient in writing. He received 2 weeks worth supply samples of his discharge medication.He left [facility] with all personal belongings via city bus transport in no apparent distress. Bus pass provided by [facility].  Consults:  psychiatry  Significant Diagnostic Studies:  labs: CBC with diff, CMP, UDS, Toxicology tests, U/A  Discharge Vitals:   Blood pressure 124/84, pulse 80, temperature 98.5 F (36.9 C), temperature source Oral, resp. rate 18, height 6' (1.829 m), weight 70.308 kg (155 lb). Body mass index is 21.02 kg/(m^2). Lab Results:   Results for orders placed during the hospital encounter of 01/22/13 (from the past 72 hour(s))  GLUCOSE, CAPILLARY     Status: None   Collection Time    01/22/13  9:30 PM      Result Value Range   Glucose-Capillary 83  70 - 99 mg/dL  GLUCOSE, CAPILLARY     Status: Abnormal   Collection Time    01/23/13  6:15 AM      Result Value Range   Glucose-Capillary 109 (*) 70 - 99 mg/dL    Physical Findings: AIMS:  , ,  ,  ,    CIWA:  CIWA-Ar Total: 3 COWS:     Psychiatric Specialty Exam: See Psychiatric Specialty Exam and Suicide Risk Assessment completed by Attending Physician prior to discharge.  Discharge destination:  Home  Is patient on multiple antipsychotic therapies at discharge:  No   Has Patient had three or more failed trials of antipsychotic monotherapy by history:  No  Recommended Plan for Multiple Antipsychotic Therapies: NA     Medication List    TAKE these medications     Indication   FLUoxetine 20 MG capsule  Commonly known as:  PROZAC  Take 1 capsule (20 mg total) by mouth daily. For depression   Indication:  Depression     traZODone 50 MG tablet  Commonly known as:  DESYREL  Take 1 tablet (50 mg total) by mouth at bedtime as needed and may repeat dose one time if needed for sleep.   Indication:  Trouble Sleeping       Follow-up Information    Follow up with [facility] On 01/28/2013. (Appt at 1PM with Rudi Rummage for therapy. )    Contact information:   [facility] [address], [place] [address] Phone: [phone] Fax: [phone]      Follow up with [facility]. (Walk-in between 8am-9am Monday through Friday for hospital followup/medication managment. )    Contact information:   [address] [place], [place] [address] phone: [phone] fax: [phone]     Follow-up recommendations:  Activity:  As tolerated Diet: As recommended by your primary care doctor. Keep all scheduled follow-up appointments as recommended.  Comments:  Take all your medications as prescribed by your mental healthcare provider. Report any adverse effects and or reactions from your medicines to your outpatient provider promptly. Patient is instructed and cautioned to not engage in alcohol and or illegal drug use while on prescription medicines. In the event of worsening symptoms, patient is instructed to call the crisis hotline, 911 and or go to the nearest ED for appropriate evaluation and treatment of symptoms. Follow-up with your primary care provider  for your other medical issues, concerns and or health care needs.   Total Discharge Time:  Greater than 30 minutes.  Signed: Sanjuana Kava, PMHNP-BC 01/25/2013, 11:24 AM  Patient was personally seen, case discussed in detail with physician extender. Reviewed the information documented and agree with the discharge treatment plan.  Peyten Weare,JANARDHAHA R. 01/25/2013 7:11 PM

## 2013-01-25 NOTE — Progress Notes (Signed)
Patient ID: [name] Kennedy, male   DOB: September 26, 1965, 47 y.o.   MRN: [id number] He has been discharge home. He voiced understanding of teaching of discharge instructions, fall and prevention and home safety. He denies thought of wanting to harm self or others. All belongings were taken home with him. He was pleasant  calm and cooperative. He was provided two bus passes.

## 2013-01-25 NOTE — [facility] Group Notes (Signed)
[facility] LCSW Aftercare Discharge Planning Group Note   01/25/2013 9:27 AM  Participation Quality:  Resistant   Mood/Affect:  Irritable  Depression Rating:  4  Anxiety Rating:  4  Thoughts of Suicide:  No Will you contract for safety?   NA  Current AVH:  No  Plan for Discharge/Comments:  Pt interested in therapy for MH issues. Not identifying any s/a issues when asked. Pt has no insurance--[facility]? Also [employer] for med management-lives in [place]. Plans to go home to his wife. Pt refused to speak more about living situation (address in system is [employer] Pt highly focused on d/c today. Told that it is not up to CSW but will present this to PA/psych during tx team and get back to him.   Transportation Means: wife or bus pass  Supports: wife  Counselling psychologist, [place]

## 2013-01-28 NOTE — Progress Notes (Signed)
Patient Discharge Instructions:  After Visit Summary (AVS):   Faxed to:  01/28/13 Discharge Summary Note:   Faxed to:  01/28/13 Psychiatric Admission Assessment Note:   Faxed to:  01/28/13 Suicide Risk Assessment - Discharge Assessment:   Faxed to:  01/28/13 Faxed/Sent to the Next Level Care provider:  01/28/13 Faxed to [facility] @ [phone] Faxed to [facility] @ [phone]  Jerelene Redden, 01/28/2013, 2:19 PM

## 2013-05-03 ENCOUNTER — Encounter (HOSPITAL_COMMUNITY): Payer: Self-pay | Admitting: *Deleted

## 2013-05-03 ENCOUNTER — Emergency Department (HOSPITAL_COMMUNITY)
Admission: EM | Admit: 2013-05-03 | Discharge: 2013-05-04 | Disposition: A | Discharge status: Home / Self Care | Payer: Self-pay | Attending: Emergency Medicine | Admitting: Emergency Medicine

## 2013-05-03 DIAGNOSIS — R1030 Lower abdominal pain, unspecified: Secondary | ICD-10-CM

## 2013-05-03 DIAGNOSIS — Z79899 Other long term (current) drug therapy: Secondary | ICD-10-CM | POA: Insufficient documentation

## 2013-05-03 DIAGNOSIS — R319 Hematuria, unspecified: Secondary | ICD-10-CM | POA: Insufficient documentation

## 2013-05-03 DIAGNOSIS — F17200 Nicotine dependence, unspecified, uncomplicated: Secondary | ICD-10-CM | POA: Insufficient documentation

## 2013-05-03 DIAGNOSIS — R109 Unspecified abdominal pain: Secondary | ICD-10-CM | POA: Insufficient documentation

## 2013-05-03 LAB — CBC WITH DIFFERENTIAL/PLATELET
Eosinophils Absolute: 0.1 10*3/uL (ref 0.0–0.7)
Eosinophils Relative: 2 % (ref 0–5)
HCT: 44.9 % (ref 39.0–52.0)
Hemoglobin: 15 g/dL (ref 13.0–17.0)
Lymphs Abs: 1.7 10*3/uL (ref 0.7–4.0)
MCH: 28.4 pg (ref 26.0–34.0)
MCV: 84.9 fL (ref 78.0–100.0)
Monocytes Absolute: 0.6 10*3/uL (ref 0.1–1.0)
Monocytes Relative: 8 % (ref 3–12)
RBC: 5.29 MIL/uL (ref 4.22–5.81)

## 2013-05-03 LAB — URINALYSIS, ROUTINE W REFLEX MICROSCOPIC
Ketones, ur: NEGATIVE mg/dL
Leukocytes, UA: NEGATIVE
Nitrite: NEGATIVE
Specific Gravity, Urine: 1.02 (ref 1.005–1.030)
Urobilinogen, UA: 0.2 mg/dL (ref 0.0–1.0)
pH: 6.5 (ref 5.0–8.0)

## 2013-05-03 LAB — COMPREHENSIVE METABOLIC PANEL
BUN: 10 mg/dL (ref 6–23)
Calcium: 9.5 mg/dL (ref 8.4–10.5)
GFR calc Af Amer: >90 mL/min (ref >90)
GFR calc non Af Amer: >90 mL/min (ref >90)
Glucose, Bld: 134 mg/dL — ABNORMAL HIGH (ref 70–99)
Total Protein: 7.2 g/dL (ref 6.0–8.3)

## 2013-05-03 LAB — URINE MICROSCOPIC-ADD ON

## 2013-05-03 MED ORDER — MORPHINE SULFATE 4 MG/ML IJ SOLN
4.0000 mg | Freq: Once | INTRAMUSCULAR | Status: AC
  Administered 2013-05-04: 4 mg via INTRAVENOUS
  Filled 2013-05-03: qty 1

## 2013-05-03 NOTE — ED Notes (Signed)
Pt made aware of need for urine specimen states he can not void at this time 

## 2013-05-03 NOTE — ED Provider Notes (Signed)
CSN: [id number]     Arrival date & time 05/03/13  1928 History   First MD Initiated Contact with Patient 05/03/13 2331     Chief Complaint  Patient presents with  . Abdominal Pain   (Consider location/radiation/quality/duration/timing/severity/associated sxs/prior Treatment) HPI Comments: Patient presents to the ED with a chief complaint of pain in the right groin area.  He reports that the pain starts in the right flank area and radiates down to the right groin.  He began having the pain this morning.  He feels that the pain is moving.  He reports that at times the pain eases up and at times the pain becomes more intense.  Nothing in particular makes the pain better or worse.  He has not taken anything for pain prior to arrival.  He denies prior history of kidney stones.  Denies urinary symptoms.  Denies hematuria.  Denies nausea, vomiting or diarrhea.    The history is provided by the patient.    Past Medical History  Diagnosis Date  . Suicidal ideations    History reviewed. No pertinent past surgical history. No family history on file. History  Substance Use Topics  . Smoking status: Current Every Day Smoker -- 1.00 packs/day    Types: Cigarettes  . Smokeless tobacco: Not on file  . Alcohol Use: Yes    Review of Systems  Genitourinary: Positive for flank pain. Negative for scrotal swelling.  All other systems reviewed and are negative.    Allergies  Review of patient's allergies indicates no known allergies.  Home Medications   Current Outpatient Rx  Name  Route  Sig  Dispense  Refill  . FLUoxetine (PROZAC) 20 MG capsule   Oral   Take 1 capsule (20 mg total) by mouth daily. For depression   30 capsule   0   . traZODone (DESYREL) 50 MG tablet   Oral   Take 1 tablet (50 mg total) by mouth at bedtime as needed and may repeat dose one time if needed for sleep.   60 tablet   0    BP 160/53  Pulse 135  Temp(Src) 98.7 F (37.1 C) (Oral)  Resp 20  SpO2  95% Physical Exam  Nursing note and vitals reviewed. Constitutional: He appears well-developed and well-nourished.  HENT:  Head: Normocephalic and atraumatic.  Mouth/Throat: Oropharynx is clear and moist.  Neck: Normal range of motion. Neck supple.  Cardiovascular: Normal rate, regular rhythm and normal heart sounds.   Pulmonary/Chest: Effort normal and breath sounds normal.  Abdominal: Soft. Bowel sounds are normal. There is CVA tenderness. Hernia confirmed negative in the right inguinal area and confirmed negative in the left inguinal area.  Right CVA tenderness Mild tenderness to palpation of the right groin area  Genitourinary: Testes normal and penis normal. Right testis shows no mass, no swelling and no tenderness. Right testis is descended. Left testis shows no mass, no swelling and no tenderness. Left testis is descended.  Patient refused rectal exam to assess prostate  Neurological: He is alert.  Skin: Skin is warm and dry.  Psychiatric: He has a normal mood and affect.    ED Course  Procedures (including critical care time) Labs Review Labs Reviewed  COMPREHENSIVE METABOLIC PANEL - Abnormal; Notable for the following:    Glucose, Bld 134 (*)    All other components within normal limits  CBC WITH DIFFERENTIAL  URINALYSIS, ROUTINE W REFLEX MICROSCOPIC   Imaging Review Ct Abdomen Pelvis Wo Contrast  05/04/2013  CLINICAL DATA:  Right flank and groin pain.  EXAM: CT ABDOMEN AND PELVIS WITHOUT CONTRAST  TECHNIQUE: Multidetector CT imaging of the abdomen and pelvis was performed following the standard protocol without intravenous contrast.  COMPARISON:  None.  FINDINGS: The lung bases are clear. Minimal dependent atelectasis.  The unenhanced appearance of the liver and spleen are unremarkable. The gallbladder is normal. No common bowel duct dilatation. The pancreas is normal. The adrenal glands and kidneys are normal. No renal or obstructing ureteral calculi. No bladder calculi.   The stomach, duodenum, small bowel and colon are grossly normal without oral contrast. The appendix is normal. No mesenteric or retroperitoneal mass or adenopathy. The aorta is normal in caliber. Scattered distal aortic and iliac artery calcifications.  The bladder, prostate gland and seminal vesicles are unremarkable. No pelvic mass, adenopathy or free pelvic fluid collections. No inguinal mass or adenopathy.  The bony structures are intact.  IMPRESSION: Unremarkable noncontrast CT examination of the abdomen/ pelvis. No renal or obstructing ureteral calculi   Electronically Signed   By: Loralie Champagne M.D.   On: 05/04/2013 00:25    MDM  No diagnosis found. Patient presenting with intermittent pain of his right flank radiating into his right groin area. Moderate hemoglobin in the urine.  CT ab/pelvis unremarkable and negative for renal or ureteral calculi.  UA negative for infection.  Labs unremarkable.  GU exam unremarkable.  Pain improved during ED course.  Therefore, feel that the patient is stable for discharge.     Pascal Lux [place], PA-C 05/05/13 1115

## 2013-05-03 NOTE — ED Notes (Signed)
Pt c/o lower right groin pain since this morning; denies n/v; denies urinary symptoms; states feels swollen

## 2013-05-04 ENCOUNTER — Emergency Department (HOSPITAL_COMMUNITY): Payer: Self-pay

## 2013-05-04 MED ORDER — OXYCODONE-ACETAMINOPHEN 5-325 MG PO TABS: 1.0000 | ORAL_TABLET | Freq: Four times a day (QID) | ORAL | Status: DC | PRN

## 2013-05-07 NOTE — ED Provider Notes (Signed)
Medical screening examination/treatment/procedure(s) were performed by non-physician practitioner and as supervising physician I was immediately available for consultation/collaboration.  Caeson Filippi L Nahom Carfagno, MD 05/07/13 1930 

## 2014-05-02 ENCOUNTER — Emergency Department (HOSPITAL_COMMUNITY)
Admission: EM | Admit: 2014-05-02 | Discharge: 2014-05-02 | Disposition: A | Discharge status: Home / Self Care | Payer: Self-pay | Attending: Emergency Medicine | Admitting: Emergency Medicine

## 2014-05-02 ENCOUNTER — Encounter (HOSPITAL_COMMUNITY): Payer: Self-pay | Admitting: Emergency Medicine

## 2014-05-02 DIAGNOSIS — F17200 Nicotine dependence, unspecified, uncomplicated: Secondary | ICD-10-CM | POA: Insufficient documentation

## 2014-05-02 DIAGNOSIS — E109 Type 1 diabetes mellitus without complications: Secondary | ICD-10-CM | POA: Insufficient documentation

## 2014-05-02 DIAGNOSIS — Z760 Encounter for issue of repeat prescription: Secondary | ICD-10-CM | POA: Insufficient documentation

## 2014-05-02 LAB — CBG MONITORING, ED: GLUCOSE-CAPILLARY: 125 mg/dL — AB (ref 70–99)

## 2014-05-02 MED ORDER — INSULIN PEN NEEDLE 29G X 8MM MISC: Status: DC

## 2014-05-02 MED ORDER — INSULIN GLARGINE 100 UNIT/ML ~~LOC~~ SOLN: SUBCUTANEOUS | Status: DC

## 2014-05-02 MED ORDER — CONTINUOUS GLUCOSE MONITOR DEVI: Status: DC

## 2014-05-02 MED ORDER — INSULIN REGULAR HUMAN 100 UNIT/ML IJ SOLN: 5.0000 [IU] | Freq: Three times a day (TID) | INTRAMUSCULAR | Status: DC

## 2014-05-02 NOTE — ED Notes (Signed)
Pt here for Rx for Insulin. States he just got out of jail.

## 2014-05-02 NOTE — ED Notes (Signed)
Care Management in to see pt. 

## 2014-05-02 NOTE — ED Provider Notes (Signed)
CSN: [id number]     Arrival date & time 05/02/14  1157 History  This chart was scribed for Langston Masker, PA-C, working with No att. providers found by Jolene Provost, ED Scribe. This patient was seen in room TR04C/TR04C and the patient's care was started at 2:12 PM.    Chief Complaint  Patient presents with  . Medication Refill   The history is provided by the patient. No language interpreter was used.   HPI Comments: [name] Kennedy is a 48 y.o. male with a history of DM who presents to the Emergency Department for a medication refill. Pt states that he was recently released from jail and has not taken his DM medication in two days. He also needs a Child psychotherapist to help him pay for his medication.   Past Medical History  Diagnosis Date  . Suicidal ideations    History reviewed. No pertinent past surgical history. History reviewed. No pertinent family history. History  Substance Use Topics  . Smoking status: Current Every Day Smoker -- 1.00 packs/day    Types: Cigarettes  . Smokeless tobacco: Not on file  . Alcohol Use: Yes    Review of Systems  All other systems reviewed and are negative.     Allergies  Review of patient's allergies indicates no known allergies.  Home Medications   Prior to Admission medications   Medication Sig Start Date End Date Taking? Authorizing Provider  oxyCODONE-acetaminophen (PERCOCET/ROXICET) 5-325 MG per tablet Take 1-2 tablets by mouth every 6 (six) hours as needed for pain. 05/04/13   Heather Laisure, PA-C   BP 120/71  Pulse 82  Temp(Src) 98.3 F (36.8 C)  Resp 18  SpO2 98% Physical Exam  Nursing note and vitals reviewed. Constitutional: He is oriented to person, place, and time. He appears well-developed and well-nourished.  HENT:  Head: Normocephalic and atraumatic.  Eyes: Conjunctivae and EOM are normal. Pupils are equal, round, and reactive to light.  Neck: Normal range of motion. Neck supple.  Cardiovascular: Normal rate, regular  rhythm and normal heart sounds.   Pulmonary/Chest: Effort normal and breath sounds normal.  Abdominal: Soft. Bowel sounds are normal.  Musculoskeletal: Normal range of motion.  Neurological: He is alert and oriented to person, place, and time.  Skin: Skin is warm and dry.  Psychiatric: He has a normal mood and affect. His behavior is normal.    ED Course  Procedures (including critical care time)  DIAGNOSTIC STUDIES: Oxygen Saturation is 98% on room air, normal by my interpretation.    COORDINATION OF CARE: 2:14 PM Social worker was called to speak with pt. Will discharge pt with DM medication and supplies. Pt agreed to treatment plan.   Labs Review Labs Reviewed  CBG MONITORING, ED - Abnormal; Notable for the following:    Glucose-Capillary 125 (*)    All other components within normal limits    Imaging Review No results found.   EKG Interpretation None      MDM   Final diagnoses:  Type 1 diabetes mellitus without complication    Pt given insulin rx. Social work will provide  Insulin [facility] to give supplies Pt advised to follow up at Wellness center   Elson Areas, PA-C 05/02/14 1504  Lonia Skinner [place], [place] 05/02/14 1505

## 2014-05-02 NOTE — ED Notes (Signed)
Pt sts recently got out of jail and needs to get his meds. sts he was taking insulin in jail. CBG 125 at triage. Pt has no complaints. Needs to speak with case manager.

## 2014-05-02 NOTE — Progress Notes (Signed)
  CARE MANAGEMENT ED NOTE 05/02/2014  Patient:  [name] Kennedy, [name] Kennedy   Account Number:  [id number]  Date Initiated:  05/02/2014  Documentation initiated by:  Ferdinand Cava  Subjective/Objective Assessment:   48 yo male presenting to the ED requesting diabetic medication refills     Subjective/Objective Assessment Detail:     Action/Plan:   Patient will get prescriptions filled at the [facility] and also schedule an appointment with PCP for continued care. If patient unable to afford he will follow at [facility], information provided   Action/Plan Detail:   Anticipated DC Date:       Status Recommendation to Physician:   Result of Recommendation:  Agreed    DC Planning Services  CM consult  MATCH Program  Medication Assistance  Other  PCP issues    Choice offered to / List presented to:  C-1 Patient          Status of service:  Completed, signed off  ED Comments:   ED Comments Detail:  This CM consulted to assist with medication refill resources. This CM spoke with the patient and his friend Erskine Squibb in the room. The patient gave permission to discuss his care with his friend present. The patient stated that he served 5 months in jail and during that time he was diagnosed with diabetes and started on insulin. He stated that he was discharged 2 days ago and has been without medications since. He also stated that he has no supplies and the jail told him to come to the ED for assistance with medication and supplies. The patient is aware of IRC services and this CM discussed the necessary follow up for continued care, management and medications to control the diabetes. Explained that the NP at the [facility] and treat for free and medications prescriptions are filled for free. This Cm then contacted the [facility] pharmacist to connect patient with resources today as the patient stated that he has no money and no supplies. This CM explained the [facility] program to the patient and his fried stated that she can lend him the  $6 for the prescriptions. The [facility] pharmacist stated that t=for one time they can connect the patient with a glucometer, syringes, lancets, and test strips. This CM explained to the patient the location of the [facility] and the [facility] letter and a pamphlet was provided, the importance of follow up was also discussed with the patient. The patient verbalized understanding and had no other questions or concerns. The ED PA was made aware of resources provided to the patient.

## 2014-05-02 NOTE — ED Notes (Signed)
PT walked out after Care Manager provided all info on free coverage of insulin and meter . Unable to obtain last set of vital signs.

## 2014-05-02 NOTE — ED Provider Notes (Signed)
Medical screening examination/treatment/procedure(s) were performed by non-physician practitioner and as supervising physician I was immediately available for consultation/collaboration.   EKG Interpretation None        Elwin Mocha, MD 05/02/14 1523

## 2014-05-02 NOTE — Discharge Instructions (Signed)
Diabetes and Exercise Exercising regularly is important. It is not just about losing weight. It has many health benefits, such as:  Improving your overall fitness, flexibility, and endurance.  Increasing your bone density.  Helping with weight control.  Decreasing your body fat.  Increasing your muscle strength.  Reducing stress and tension.  Improving your overall health. People with diabetes who exercise gain additional benefits because exercise:  Reduces appetite.  Improves the body's use of blood sugar (glucose).  Helps lower or control blood glucose.  Decreases blood pressure.  Helps control blood lipids (such as cholesterol and triglycerides).  Improves the body's use of the hormone insulin by:  Increasing the body's insulin sensitivity.  Reducing the body's insulin needs.  Decreases the risk for heart disease because exercising:  Lowers cholesterol and triglycerides levels.  Increases the levels of good cholesterol (such as high-density lipoproteins [HDL]) in the body.  Lowers blood glucose levels. YOUR ACTIVITY PLAN  Choose an activity that you enjoy and set realistic goals. Your health care provider or diabetes educator can help you make an activity plan that works for you. Exercise regularly as directed by your health care provider. This includes:  Performing resistance training twice a week such as push-ups, sit-ups, lifting weights, or using resistance bands.  Performing 150 minutes of cardio exercises each week such as walking, running, or playing sports.  Staying active and spending no more than 90 minutes at one time being inactive. Even short bursts of exercise are good for you. Three 10-minute sessions spread throughout the day are just as beneficial as a single 30-minute session. Some exercise ideas include:  Taking the dog for a walk.  Taking the stairs instead of the elevator.  Dancing to your favorite song.  Doing an exercise  video.  Doing your favorite exercise with a friend. RECOMMENDATIONS FOR EXERCISING WITH TYPE 1 OR TYPE 2 DIABETES   Check your blood glucose before exercising. If blood glucose levels are greater than 240 mg/dL, check for urine ketones. Do not exercise if ketones are present.  Avoid injecting insulin into areas of the body that are going to be exercised. For example, avoid injecting insulin into:  The arms when playing tennis.  The legs when jogging.  Keep a record of:  Food intake before and after you exercise.  Expected peak times of insulin action.  Blood glucose levels before and after you exercise.  The type and amount of exercise you have done.  Review your records with your health care provider. Your health care provider will help you to develop guidelines for adjusting food intake and insulin amounts before and after exercising.  If you take insulin or oral hypoglycemic agents, watch for signs and symptoms of hypoglycemia. They include:  Dizziness.  Shaking.  Sweating.  Chills.  Confusion.  Drink plenty of water while you exercise to prevent dehydration or heat stroke. Body water is lost during exercise and must be replaced.  Talk to your health care provider before starting an exercise program to make sure it is safe for you. Remember, almost any type of activity is better than none. Document Released: 10/12/2003 Document Revised: 12/06/2013 Document Reviewed: 12/29/2012 [facility] Patient Information 2015 [place], [place]. This information is not intended to replace advice given to you by your health care provider. Make sure you discuss any questions you have with your health care provider.  Correction Insulin Your health care provider has decided you need to take insulin regularly. You have been given a correction  scale (also called a sliding scale) in case you need extra insulin when your blood sugar is too high (hyperglycemia). The following instructions will  assist you in how to use that correction scale.  WHAT IS A CORRECTION SCALE?  When you check your blood sugar, sometimes it will be higher than your health care provider has told you it should be. You may need an extra dose of insulin to bring your blood sugar to the recommended level (also known as your goal, target, or normal level). The correction scale is prescribed by your health care provider based on your specific needs.  Your correction scale has two parts:   The first shows you a blood sugar range.   The second part tells you how much extra insulin to give yourself if your blood sugar falls within this range. You will not need an extra dose of insulin if your blood glucose is in the desired range. You should simply give yourself the normal amount of insulin that your health care provider has ordered for you.  WHY IS IT IMPORTANT TO KEEP YOUR BLOOD SUGAR LEVELS AT YOUR DESIRED LEVEL?  Keeping your blood sugar at the desired level helps to prevent long-term complications of diabetes, such as eye disease, kidney failure, nerve damage, and other serious complications. WHAT TYPE OF INSULIN WILL YOU USE?  To help bring down blood sugar levels that are too high, your health care provider will prescribe a short-acting or a rapid-acting insulin. An example of a short-acting insulin would be regular insulin. Remember, you may also have a longer-acting insulin prescribed for you.  WHAT DO YOU NEED TO DO?   Check your blood sugar with your home blood glucose meter as recommended by your health care provider.   Using your correction scale, find the range that your blood sugar lies in.   Look for the units of insulin that match that blood sugar range. Give yourself the dose of correction insulin your health care provider has prescribed. Always make sure you are using the right type of insulin.   Prior to the injection, make sure you have food available that you can eat in the next 15-30  minutes.   If your correction insulin is rapid acting, start eating your meal within 15 minutes after you have given yourself the insulin injection. If you wait longer than 15 minutes to eat, your blood sugar might get too low.   If your correction insulin is short acting(regular), start eating your meal within 30 minutes after you have given yourself the insulin injection. If you wait longer than 30 minutes to eat, your blood sugar might get too low. Symptoms of low blood sugar (hypoglycemia) may include feeling shaky or weak, sweating, feeling confused, difficulty seeing, agitation, crankiness, or numbness of the lips or tongue. Check your blood sugar immediately and treat your results as directed by your health care provider.   Keep a log of your blood sugar results with the time you took the test and the amount of insulin that you injected. This information will help your health care provider manage your medicines.   Note on your log anything that may affect your blood sugar level, such as:   Changes in normal exercise or activity.   Changes in your normal schedule, such as staying up late, going on vacation, changing your diet, or holidays.   New medicines. This includes prescription and over-the-counter medicines. Some medicines may cause high blood sugar.   Sickness, stress, or  anxiety.   Changes in the time you took your medicine.   Changes in your meals, such as skipping a meal, having a late meal, or dining out.   Eating things that may affect blood glucose, such as snacks, meal portions that are larger than normal, drinks with sugar, or eating less than usual.   Ask your health care provider any questions you have.  Be aware of "stacking" your insulin doses. This happens when you correct a high blood sugar level by giving yourself extra insulin too soon after a previous correction dose or mealtime dose. You may then have too much insulin still active in your body and  may be at risk for hypoglycemia. WHY DO YOU NEED A CORRECTION SCALE IF YOU HAVE NEVER BEEN DIAGNOSED WITH DIABETES?   Keeping your blood glucose in the target range is important for your overall health.   You may have been prescribed medicines that cause your blood glucose to be higher than normal. WHEN SHOULD YOU SEEK MEDICAL CARE? Contact your health care provider if:   You have experienced hypoglycemia that you are unable to treat with your usual routine.   You have a high blood sugar level that is not coming down with the correction dose.  Your blood sugar is often too low or does not come up even if you eat a fast-acting carbohydrate. Someone who lives with you should seek immediate medical care if you become unresponsive. Document Released: 12/13/2010 Document Revised: 03/24/2013 Document Reviewed: 01/01/2013 [facility] Patient Information 2015 [place], [place]. This information is not intended to replace advice given to you by your health care provider. Make sure you discuss any questions you have with your health care provider.  Blood Glucose Monitoring Monitoring your blood glucose (also know as blood sugar) helps you to manage your diabetes. It also helps you and your health care provider monitor your diabetes and determine how well your treatment plan is working. WHY SHOULD YOU MONITOR YOUR BLOOD GLUCOSE?  It can help you understand how food, exercise, and medicine affect your blood glucose.  It allows you to know what your blood glucose is at any given moment. You can quickly tell if you are having low blood glucose (hypoglycemia) or high blood glucose (hyperglycemia).  It can help you and your health care provider know how to adjust your medicines.  It can help you understand how to manage an illness or adjust medicine for exercise. WHEN SHOULD YOU TEST? Your health care provider will help you decide how often you should check your blood glucose. This may depend on the  type of diabetes you have, your diabetes control, or the types of medicines you are taking. Be sure to write down all of your blood glucose readings so that this information can be reviewed with your health care provider. See below for examples of testing times that your health care provider may suggest. Type 1 Diabetes  Test 4 times a day if you are in good control, using an insulin pump, or perform multiple daily injections.  If your diabetes is not well controlled or if you are sick, you may need to monitor more often.  It is a good idea to also monitor:  Before and after exercise.  Between meals and 2 hours after a meal.  Occasionally between 2:00 a.m. and 3:00 a.m. Type 2 Diabetes  It can vary with each person, but generally, if you are on insulin, test 4 times a day.  If you take medicines  by mouth (orally), test 2 times a day.  If you are on a controlled diet, test once a day.  If your diabetes is not well controlled or if you are sick, you may need to monitor more often. HOW TO MONITOR YOUR BLOOD GLUCOSE Supplies Needed  Blood glucose meter.  Test strips for your meter. Each meter has its own strips. You must use the strips that go with your own meter.  A pricking needle (lancet).  A device that holds the lancet (lancing device).  A journal or log book to write down your results. Procedure  Wash your hands with soap and water. Alcohol is not preferred.  Prick the side of your finger (not the tip) with the lancet.  Gently milk the finger until a small drop of blood appears.  Follow the instructions that come with your meter for inserting the test strip, applying blood to the strip, and using your blood glucose meter. Other Areas to Get Blood for Testing Some meters allow you to use other areas of your body (other than your finger) to test your blood. These areas are called alternative sites. The most common alternative sites are:  The forearm.  The  thigh.  The back area of the lower leg.  The palm of the hand. The blood flow in these areas is slower. Therefore, the blood glucose values you get may be delayed, and the numbers are different from what you would get from your fingers. Do not use alternative sites if you think you are having hypoglycemia. Your reading will not be accurate. Always use a finger if you are having hypoglycemia. Also, if you cannot feel your lows (hypoglycemia unawareness), always use your fingers for your blood glucose checks. ADDITIONAL TIPS FOR GLUCOSE MONITORING  Do not reuse lancets.  Always carry your supplies with you.  All blood glucose meters have a 24-hour "hotline" number to call if you have questions or need help.  Adjust (calibrate) your blood glucose meter with a control solution after finishing a few boxes of strips. BLOOD GLUCOSE RECORD KEEPING It is a good idea to keep a daily record or log of your blood glucose readings. Most glucose meters, if not all, keep your glucose records stored in the meter. Some meters come with the ability to download your records to your home computer. Keeping a record of your blood glucose readings is especially helpful if you are wanting to look for patterns. Make notes to go along with the blood glucose readings because you might forget what happened at that exact time. Keeping good records helps you and your health care provider to work together to achieve good diabetes management.  Document Released: 07/25/2003 Document Revised: 12/06/2013 Document Reviewed: 12/14/2012 [facility] Patient Information 2015 [place], [place]. This information is not intended to replace advice given to you by your health care provider. Make sure you discuss any questions you have with your health care provider.

## 2014-05-02 NOTE — ED Notes (Signed)
Case management called

## 2014-09-23 IMAGING — CT CT ABD-PELV W/O CM
1 series · 13 of 15 positions shown, 19 images · non-contrast
Comparison: None.

CLINICAL DATA: Right flank and groin pain.

EXAM:
CT ABDOMEN AND PELVIS WITHOUT CONTRAST
TECHNIQUE: Multidetector CT imaging of the abdomen and pelvis was performed
following the standard protocol without intravenous contrast.

[Series 3: lung · axial · 0.59mm/px · z∈[+1598,+1658]mm · 13 of 15 slices shown, 19 images]
[im 2/15  soft-tissue]
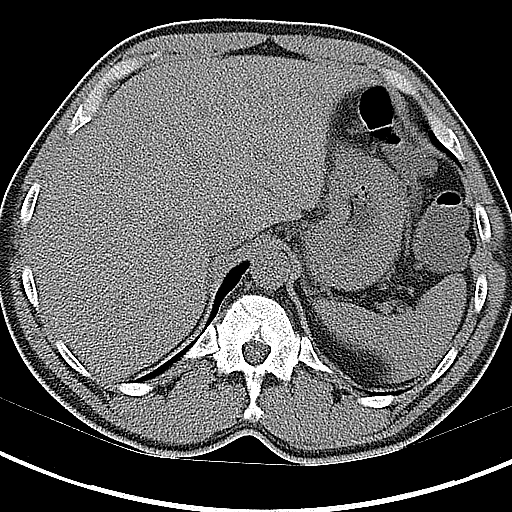
[im 2/15  bone]
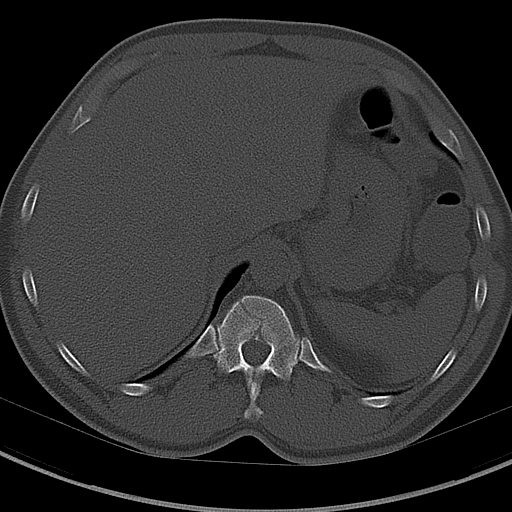
[im 3/15  soft-tissue]
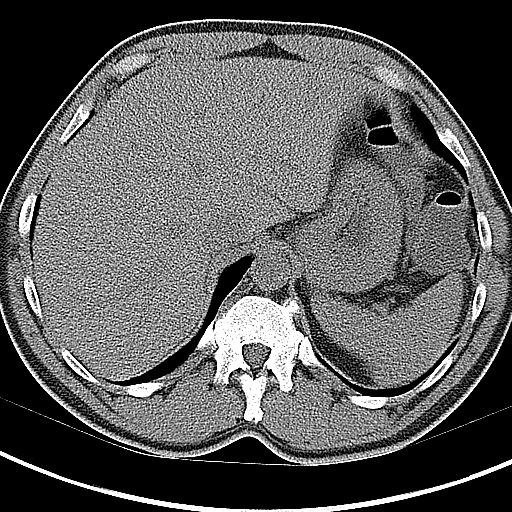
[im 4/15  soft-tissue]
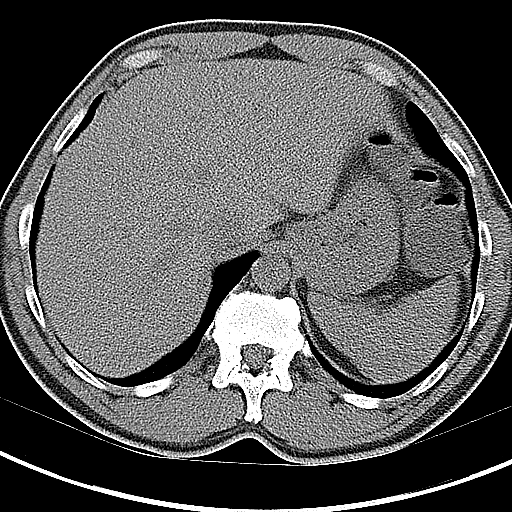
[im 5/15  soft-tissue]
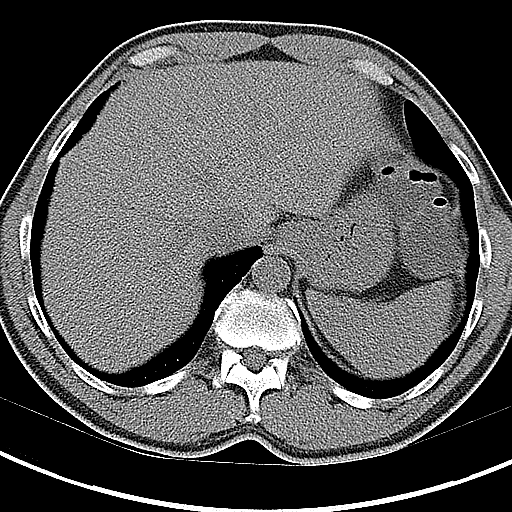
[im 6/15  soft-tissue]
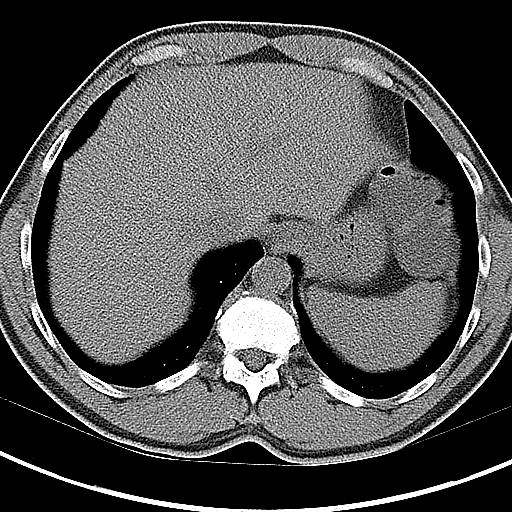
[im 7/15  soft-tissue]
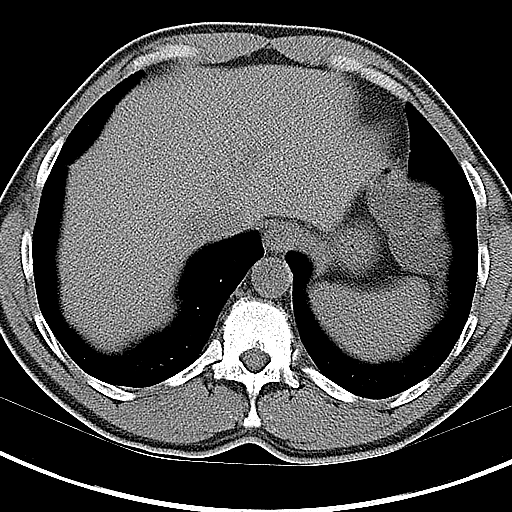
[im 8/15  soft-tissue]
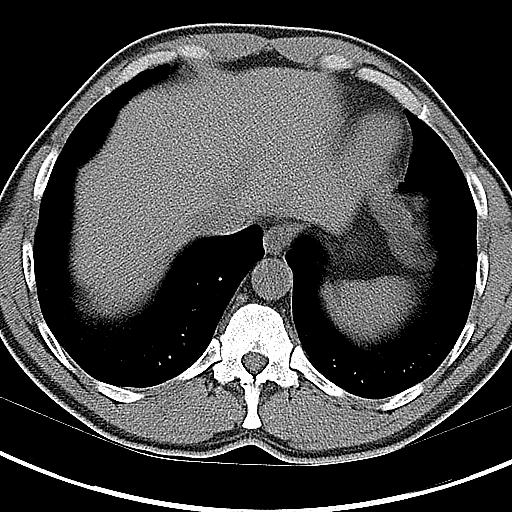
[im 9/15  soft-tissue]
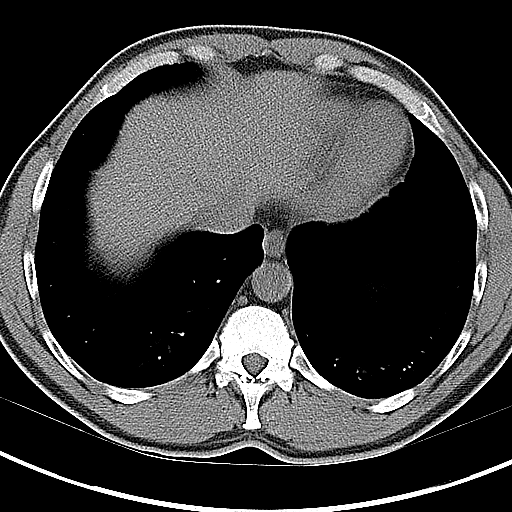
[im 10/15  soft-tissue]
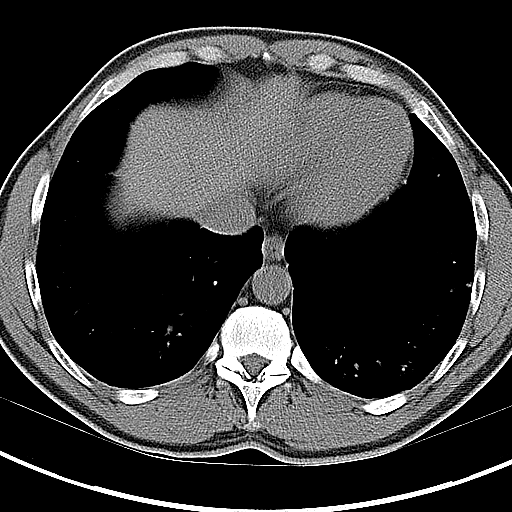
[im 10/15  bone]
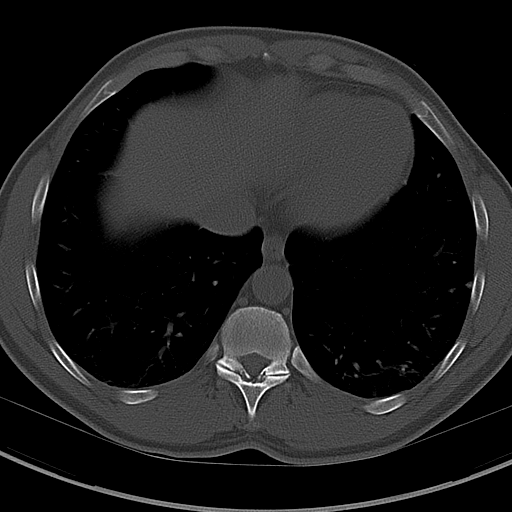
[im 11/15  soft-tissue]
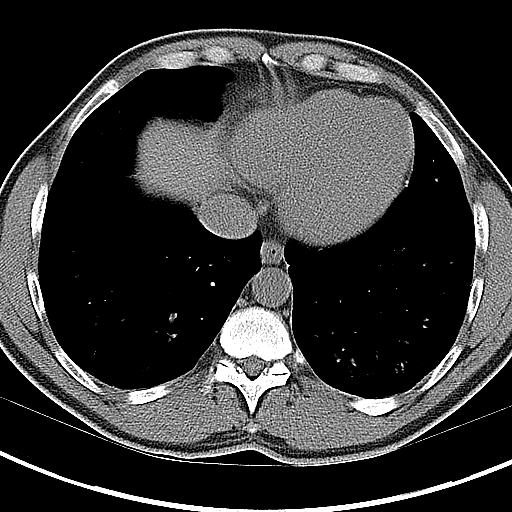
[im 11/15  lung]
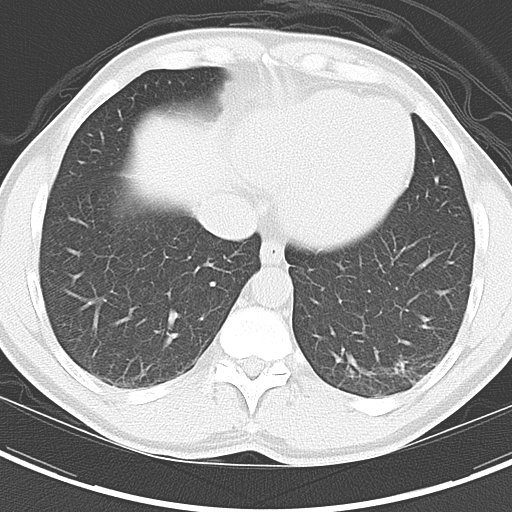
[im 12/15  soft-tissue]
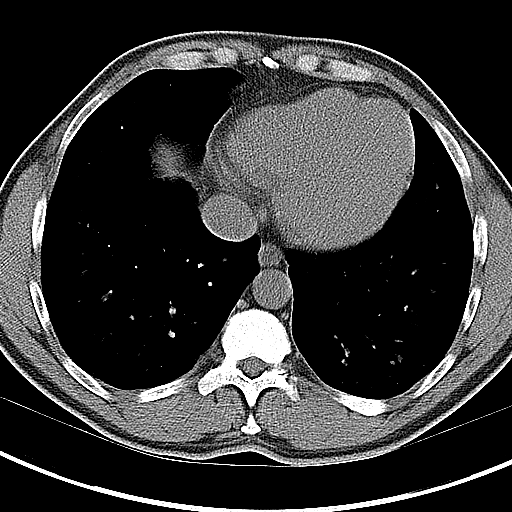
[im 12/15  lung]
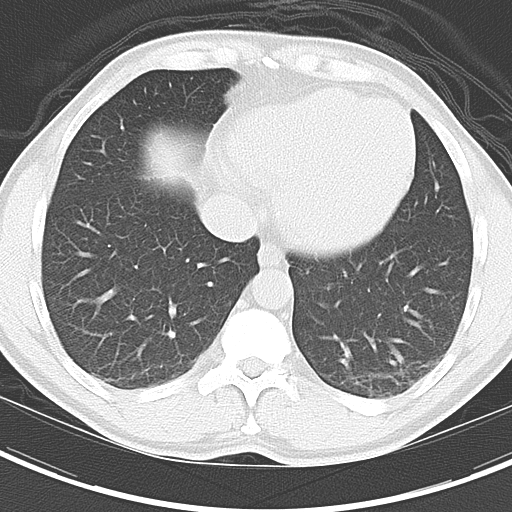
[im 13/15  soft-tissue]
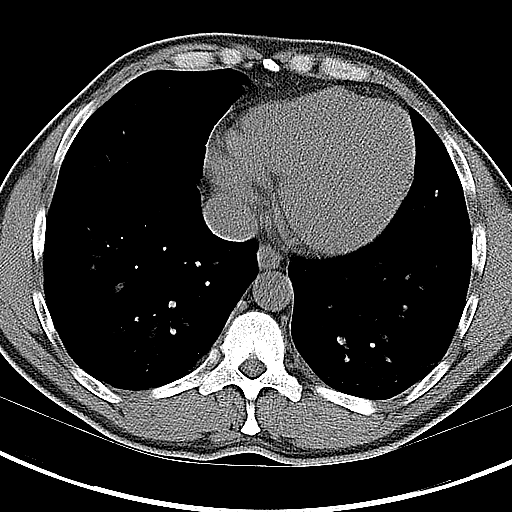
[im 13/15  lung]
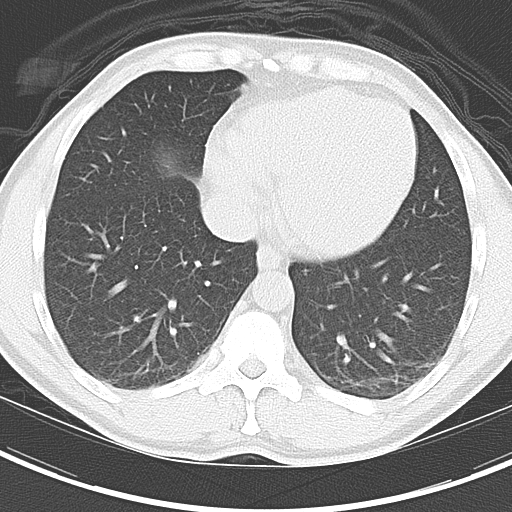
[im 14/15  soft-tissue]
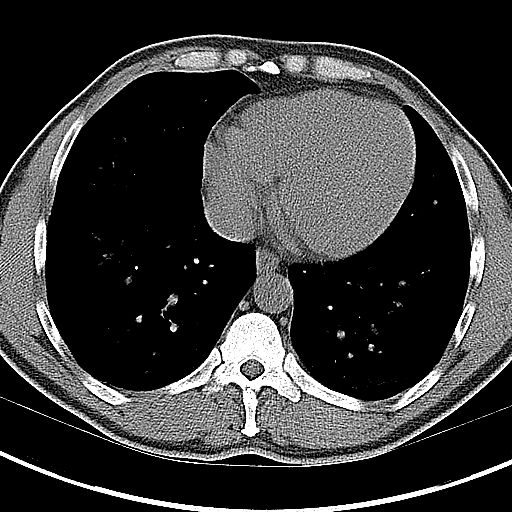
[im 14/15  lung]
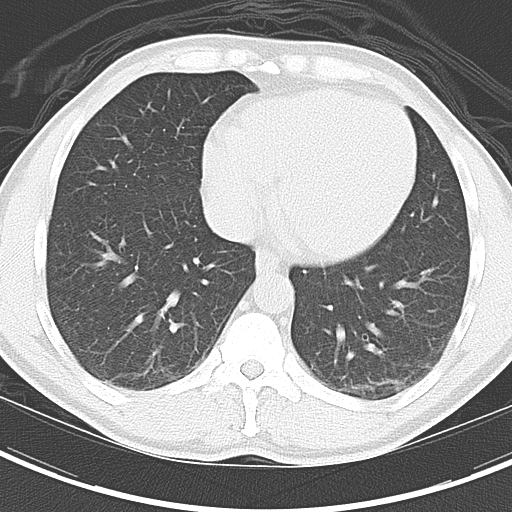

[13 of 15 positions shown; findings below may reference images not displayed]

FINDINGS: The lung bases are clear. Minimal dependent atelectasis.

The unenhanced appearance of the liver and spleen are unremarkable.
The gallbladder is normal. No common bowel duct dilatation. The
pancreas is normal. The adrenal glands and kidneys are normal. No
renal or obstructing ureteral calculi. No bladder calculi.

The stomach, duodenum, small bowel and colon are grossly normal
without oral contrast. The appendix is normal. No mesenteric or
retroperitoneal mass or adenopathy. The aorta is normal in caliber.
Scattered distal aortic and iliac artery calcifications.

The bladder, prostate gland and seminal vesicles are unremarkable.
No pelvic mass, adenopathy or free pelvic fluid collections. No
inguinal mass or adenopathy.

The bony structures are intact.
IMPRESSION: Unremarkable noncontrast CT examination of the abdomen/ pelvis. No
renal or obstructing ureteral calculi

## 2015-04-22 ENCOUNTER — Emergency Department (HOSPITAL_COMMUNITY)
Admission: EM | Admit: 2015-04-22 | Discharge: 2015-04-23 | Disposition: A | Discharge status: Other Acute Inpatient Hospital | Payer: Self-pay | Attending: Emergency Medicine | Admitting: Emergency Medicine

## 2015-04-22 ENCOUNTER — Encounter (HOSPITAL_COMMUNITY): Payer: Self-pay | Admitting: *Deleted

## 2015-04-22 DIAGNOSIS — F1410 Cocaine abuse, uncomplicated: Secondary | ICD-10-CM | POA: Insufficient documentation

## 2015-04-22 DIAGNOSIS — E119 Type 2 diabetes mellitus without complications: Secondary | ICD-10-CM | POA: Insufficient documentation

## 2015-04-22 DIAGNOSIS — Z794 Long term (current) use of insulin: Secondary | ICD-10-CM | POA: Insufficient documentation

## 2015-04-22 DIAGNOSIS — Z720 Tobacco use: Secondary | ICD-10-CM | POA: Insufficient documentation

## 2015-04-22 HISTORY — DX: Type 2 diabetes mellitus without complications: E11.9

## 2015-04-22 LAB — ETHANOL: ALCOHOL ETHYL (B): 84 mg/dL — AB (ref <5)

## 2015-04-22 LAB — RAPID URINE DRUG SCREEN, HOSP PERFORMED
AMPHETAMINES: NOT DETECTED
BENZODIAZEPINES: NOT DETECTED
Barbiturates: NOT DETECTED
Cocaine: POSITIVE — AB
OPIATES: NOT DETECTED
TETRAHYDROCANNABINOL: NOT DETECTED

## 2015-04-22 LAB — COMPREHENSIVE METABOLIC PANEL
ALBUMIN: 3.7 g/dL (ref 3.5–5.0)
ALT: 24 U/L (ref 17–63)
ANION GAP: 13 (ref 5–15)
AST: 21 U/L (ref 15–41)
Alkaline Phosphatase: 55 U/L (ref 38–126)
BUN: 12 mg/dL (ref 6–20)
CHLORIDE: 103 mmol/L (ref 101–111)
CO2: 19 mmol/L — ABNORMAL LOW (ref 22–32)
Calcium: 9.7 mg/dL (ref 8.9–10.3)
Creatinine, Ser: 0.98 mg/dL (ref 0.61–1.24)
GFR calc Af Amer: >60 mL/min (ref >60)
Glucose, Bld: 285 mg/dL — ABNORMAL HIGH (ref 65–99)
POTASSIUM: 3.8 mmol/L (ref 3.5–5.1)
Sodium: 135 mmol/L (ref 135–145)
Total Bilirubin: 0.3 mg/dL (ref 0.3–1.2)
Total Protein: 6.2 g/dL — ABNORMAL LOW (ref 6.5–8.1)

## 2015-04-22 LAB — CBC
HCT: 44.2 % (ref 39.0–52.0)
Hemoglobin: 14.8 g/dL (ref 13.0–17.0)
MCH: 27.7 pg (ref 26.0–34.0)
MCHC: 33.5 g/dL (ref 30.0–36.0)
MCV: 82.8 fL (ref 78.0–100.0)
PLATELETS: 276 10*3/uL (ref 150–400)
RBC: 5.34 MIL/uL (ref 4.22–5.81)
RDW: 12.4 % (ref 11.5–15.5)
WBC: 9.1 10*3/uL (ref 4.0–10.5)

## 2015-04-22 LAB — CBG MONITORING, ED
GLUCOSE-CAPILLARY: 214 mg/dL — AB (ref 65–99)
GLUCOSE-CAPILLARY: 313 mg/dL — AB (ref 65–99)
GLUCOSE-CAPILLARY: 122 mg/dL — AB (ref 65–99)
Glucose-Capillary: 250 mg/dL — ABNORMAL HIGH (ref 65–99)
Glucose-Capillary: 212 mg/dL — ABNORMAL HIGH (ref 65–99)

## 2015-04-22 MED ORDER — LORAZEPAM 1 MG PO TABS: 0.0000 mg | ORAL_TABLET | Freq: Two times a day (BID) | ORAL | Status: DC

## 2015-04-22 MED ORDER — ONDANSETRON HCL 4 MG PO TABS: 4.0000 mg | ORAL_TABLET | Freq: Three times a day (TID) | ORAL | Status: DC | PRN

## 2015-04-22 MED ORDER — INSULIN ASPART 100 UNIT/ML ~~LOC~~ SOLN
0.0000 [IU] | Freq: Three times a day (TID) | SUBCUTANEOUS | Status: DC
  Administered 2015-04-22: 3 [IU] via SUBCUTANEOUS
  Administered 2015-04-22: 7 [IU] via SUBCUTANEOUS
  Administered 2015-04-23: 3 [IU] via SUBCUTANEOUS
  Administered 2015-04-23: 5 [IU] via SUBCUTANEOUS
  Filled 2015-04-22 (×4): qty 1

## 2015-04-22 MED ORDER — LORAZEPAM 2 MG/ML IJ SOLN: 0.0000 mg | Freq: Four times a day (QID) | INTRAMUSCULAR | Status: DC

## 2015-04-22 MED ORDER — NICOTINE 21 MG/24HR TD PT24
21.0000 mg | MEDICATED_PATCH | Freq: Every day | TRANSDERMAL | Status: DC
  Filled 2015-04-22: qty 1

## 2015-04-22 MED ORDER — LORAZEPAM 2 MG/ML IJ SOLN
0.0000 mg | Freq: Two times a day (BID) | INTRAMUSCULAR | Status: DC
Start: 2015-04-24 — End: 2015-04-22

## 2015-04-22 MED ORDER — LORAZEPAM 1 MG PO TABS
0.0000 mg | ORAL_TABLET | Freq: Four times a day (QID) | ORAL | Status: DC
Start: 2015-04-22 — End: 2015-04-23

## 2015-04-22 MED ORDER — VITAMIN B-1 100 MG PO TABS
100.0000 mg | ORAL_TABLET | Freq: Every day | ORAL | Status: DC
  Administered 2015-04-22 – 2015-04-23 (×2): 100 mg via ORAL
  Filled 2015-04-22 (×2): qty 1

## 2015-04-22 MED ORDER — THIAMINE HCL 100 MG/ML IJ SOLN: 100.0000 mg | Freq: Every day | INTRAMUSCULAR | Status: DC

## 2015-04-22 MED ORDER — IBUPROFEN 200 MG PO TABS: 600.0000 mg | ORAL_TABLET | Freq: Three times a day (TID) | ORAL | Status: DC | PRN

## 2015-04-22 MED ORDER — ACETAMINOPHEN 325 MG PO TABS: 650.0000 mg | ORAL_TABLET | ORAL | Status: DC | PRN

## 2015-04-22 MED ORDER — INSULIN ASPART 100 UNIT/ML ~~LOC~~ SOLN
2.0000 [IU] | Freq: Once | SUBCUTANEOUS | Status: AC
  Administered 2015-04-22: 2 [IU] via SUBCUTANEOUS
  Filled 2015-04-22: qty 1

## 2015-04-22 MED ORDER — ALUM & MAG HYDROXIDE-SIMETH 200-200-20 MG/5ML PO SUSP: 30.0000 mL | ORAL | Status: DC | PRN

## 2015-04-22 NOTE — ED Notes (Signed)
Offered pt snack - declined at this time. Voiced understanding of [facility] policies.

## 2015-04-22 NOTE — [facility] Counselor (Signed)
Disposition: Per Hulan Fess, NP, Inpt tx recommended.  Per Rutha Bouchard, no appropriate [facility] beds available. TTS to seek placement.

## 2015-04-22 NOTE — ED Notes (Signed)
Pt noted to be tearful. States does not want to be homeless anymore d/t "people are hurting me". Will not specify as to who. States people on the street. States wants help w/SI. States is not able to read/write well. Advised pt [facility] will possibly be accepting him when has beds open.

## 2015-04-22 NOTE — ED Provider Notes (Signed)
CSN: [id number]     Arrival date & time 04/22/15  0143 History  This chart was scribed for [name] Palumbo, MD by Freida Busman, ED Scribe. This patient was seen in room B18C/B18C and the patient's care was started 2:12 AM.    Chief Complaint  Patient presents with  . Psychiatric Evaluation     Patient is a 49 y.o. male presenting with alcohol problem. The history is provided by the patient. No language interpreter was used.  Alcohol Problem This is a chronic problem. The current episode started more than 1 week ago. The problem occurs constantly. The problem has not changed since onset.Pertinent negatives include no chest pain, no abdominal pain, no headaches and no shortness of breath. Nothing aggravates the symptoms. Nothing relieves the symptoms. He has tried nothing for the symptoms. The treatment provided no relief.   HPI Comments:  [name] Kennedy is a 49 y.o. male with a history of cocaine abuse, who presents to the Emergency Department complaining of depression and SI without a plan. He denies h/o schizophrenia and bipolar disorder. He also denies use of depression meds.His last drink of ETOH was this AM.  No alleviating factors noted. Pt has no physical complaints at this time.  Past Medical History  Diagnosis Date  . Suicidal ideations   . Diabetes mellitus without complication    History reviewed. No pertinent past surgical history. No family history on file. Social History  Substance Use Topics  . Smoking status: Current Every Day Smoker -- 1.00 packs/day    Types: Cigarettes  . Smokeless tobacco: None  . Alcohol Use: Yes    Review of Systems  Constitutional: Negative for fever and chills.  Respiratory: Negative for shortness of breath.   Cardiovascular: Negative for chest pain.  Gastrointestinal: Negative for nausea, vomiting and abdominal pain.  Neurological: Negative for headaches.  Psychiatric/Behavioral: Positive for suicidal ideas.  All other systems reviewed and  are negative.  Allergies  Review of patient's allergies indicates no known allergies.  Home Medications   Prior to Admission medications   Medication Sig Start Date End Date Taking? Authorizing Provider  Continuous Glucose Monitor DEVI Please provide with affordable device, alcohol swabs. Needles and strips. 05/02/14   Elson Areas, PA-C  insulin glargine (LANTUS) 100 UNIT/ML injection 15 units in am 05/02/14   Elson Areas, PA-C  Insulin Pen Needle 29G X MISC Please provide insulin needles 05/02/14   Elson Areas, PA-C  insulin regular (HUMULIN R) 100 units/mL injection Inject 0.05 mLs (5 Units total) into the skin 3 (three) times daily before meals. 05/02/14   Elson Areas, PA-C  oxyCODONE-acetaminophen (PERCOCET/ROXICET) 5-325 MG per tablet Take 1-2 tablets by mouth every 6 (six) hours as needed for pain. 05/04/13   Heather Laisure, PA-C   BP 142/82 mmHg  Pulse 116  Temp(Src) 98.2 F (36.8 C) (Oral)  Resp 16  SpO2 96% Physical Exam  Constitutional: He is oriented to person, place, and time. He appears well-developed and well-nourished. No distress.  HENT:  Head: Normocephalic and atraumatic.  Mouth/Throat: Oropharynx is clear and moist. No oropharyngeal exudate.  Moist mucous membranes   Eyes: Conjunctivae are normal. Pupils are equal, round, and reactive to light.  Neck: Normal range of motion. Neck supple. No tracheal deviation present.  Trachea midline  Cardiovascular: Normal rate, regular rhythm and normal heart sounds.   Pulmonary/Chest: Effort normal and breath sounds normal. No respiratory distress. He has no wheezes. He has no rales.  Abdominal:  Soft. Bowel sounds are normal. He exhibits no distension. There is no tenderness. There is no rebound and no guarding.  Musculoskeletal: Normal range of motion.  Neurological: He is alert and oriented to person, place, and time. He has normal reflexes.  Skin: Skin is warm and dry.  Psychiatric: He has a normal mood and  affect.  Nursing note and vitals reviewed.   ED Course  Procedures   DIAGNOSTIC STUDIES:  Oxygen Saturation is 96% on RA, normal by my interpretation.    COORDINATION OF CARE:  2:16 AM Discussed treatment plan with pt at bedside and pt agreed to plan.  Labs Review Labs Reviewed  CBC  ETHANOL  COMPREHENSIVE METABOLIC PANEL  URINE RAPID DRUG SCREEN, HOSP PERFORMED    Imaging Review No results found. I have personally reviewed and evaluated these images and lab results as part of my medical decision-making.   EKG Interpretation None      MDM   Final diagnoses:  None   Disposition per TTS   I personally performed the services described in this documentation, which was scribed in my presence. The recorded information has been reviewed and is accurate.     Cy Blamer, MD 04/22/15 [phone]

## 2015-04-22 NOTE — ED Notes (Signed)
Telepsych monitor placed at bedside.  

## 2015-04-22 NOTE — BH Assessment (Addendum)
Tele Assessment Note   [name] Kennedy is an African-American, single, homeless 49 y.o. male presenting to [facility] c/o worsening depression, alcohol and cocaine abuse, and suicidal ideations with a plan to walk out in front of traffic Pt's BAL is 3 and UDS is +Cocaine upon arrival to ED. He is well-known to the ED but is not as agitated and hostile towards [facility] staff as he typically is. The pt did voice SI to run out into traffic or harm himself in a variety of ways, but he denies making any actual attempts tonight. Pt admits to regular alcohol and cocaine use "just whenever I can get my hands on some"; he adds that he drinks "whenever it's available to me". Pt claims he cannot quantify how much cocaine he uses in a 24-hr period but he has stated in recent interviews, "I guess I use about ten dollars worth [of cocaine per day]". Current stressors include homelessness, lack of social supports, lack of stable residence, negative influences in the homes that he does find to stay in from time to time, and his mother's death a couple of years ago.  Pt is not currently in active withdrawal and he reports his last use of etoh and cocaine as roday. He presents with disheveled appearance, fair eye-contact, soft yet logical speech, anxious affect. Thought process is linear and relevant but judgment is impaired and insight is poor. Pt is well-oriented He endorses depressive sx such as hopelessness, fatigue, feelings of worthlessness, tearfulness, loss of interest in usual pleasures, lack of motivation, increased irritability and anger, ans social islation. Pt also reports widespread body aches and pains. Pt has a hx of several suicide attempts throughout the years and says he has suffered from depression since childhood. Pt denies any current A/VH or HI.  eports agitation, confusion, restlessness, anxiety, and appears confused at times. Pt denies any previous admissions for substance abuse "but I went [place] that place  [place] a couple [place] times." Pt cannot identify a specific stressor that led to this attempt, but lives at the shelter and reports feeling hopeless and helpless with minimal support. Pt reports a family history of substance abuse, but no family supports. Pt denies any HI or any active psychosis. Pt denies previous treatment in an inpatient facility but is seeking inpatient help today. Pt states that his appetite is fair and his sleep is disturbed.   Disposition:  Per Hulan Fess, NP, Inpt tx recommended.   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 Suicidal ideations   . Diabetes mellitus without complication    Axis IV: economic problems, housing problems, other psychosocial or environmental problems, problems related to legal system/crime, problems with access to health care services and problems with primary support group Axis V: 31-40 impairment in reality testing  Past Medical History:  Past Medical History  Diagnosis Date  . Suicidal ideations   . Diabetes mellitus without complication     History reviewed. No pertinent past surgical history.  Family History: History reviewed. No pertinent family history.  Social History:  reports that he has been smoking Cigarettes.  He has been smoking about 1.00 pack per day. He does not have any smokeless tobacco history on file. He reports that he drinks alcohol. He reports that he uses  illicit drugs (Cocaine).  Additional Social History:  Alcohol / Drug Use Pain Medications: See PTA med list Prescriptions: See PTA med list Over the Counter: See PTA med list History of alcohol / drug use?: Yes Substance #1 Name of Substance 1: Etoh 1 - Age of First Use: teens (?) 1 - Amount (size/oz): "Varies, it's as much as I can" (to alleviate  depression) 1 - Frequency: Daily, if available 1 - Duration: many years (pt unsure) 1 - Last Use / Amount: Today, 04/22/15 Substance #2 Name of Substance 2: Cocaine 2 - Age of First Use: UTA 2 - Amount (size/oz): "Varies" 2 - Frequency: Daily, if available 2 - Duration: many years (pt unsure) 2 - Last Use / Amount: Today, 04/22/15  CIWA: CIWA-Ar BP: 124/82 mmHg Pulse Rate: 102 COWS:    PATIENT STRENGTHS: (choose at least two) Ability for insight Average or above average intelligence Capable of independent living Communication skills Supportive family/friends  Allergies: No Known Allergies  Home Medications:  (Not in a hospital admission)  OB/GYN Status:  No LMP for male patient.  General Assessment Data Location of Assessment: [facility] ED TTS Assessment: In system Is this a Tele or Face-to-Face Assessment?: Tele Assessment Is this an Initial Assessment or a Re-assessment for this encounter?: Initial Assessment Marital status: Single Is patient pregnant?: No Pregnancy Status: No Living Arrangements: Alone, Other (Comment) (Pt is Homeless) Can pt return to current living arrangement?: Yes Admission Status: Voluntary Is patient capable of signing voluntary admission?: Yes Referral Source: Self/Family/Friend Insurance type: None     Crisis Care Plan Living Arrangements: Alone, Other (Comment) (Pt is Homeless) Name of Psychiatrist: None Name of Therapist: None  Education Status Is patient currently in school?: No Current Grade: na Highest grade of school patient has completed: 12 Name of school: na Contact person: na  Risk to self with the past 6 months Suicidal Ideation: Yes-Currently Present Has patient been a risk to self within the past 6 months prior to admission? : Yes Suicidal Intent: Yes-Currently Present Has patient had any suicidal intent within the past 6 months prior to admission? : Yes Is patient at risk for suicide?: Yes Suicidal Plan?: Yes-Currently  Present Has patient had any suicidal plan within the past 6 months prior to admission? : Yes Specify Current Suicidal Plan: To walk out in front of traffic Access to Means: Yes Specify Access to Suicidal Means: access to busy roads What has been your use of drugs/alcohol within the last 12 months?: Regular use of Etoh and Cocaine Previous Attempts/Gestures: No How many times?: 0 Other Self Harm Risks: SA, Impulsivity Triggers for Past Attempts: Unpredictable, Other (Comment) (Mother's death, pt's living situation, feeling helpless) Intentional Self Injurious Behavior: None Family Suicide History: Unknown Recent stressful life event(s): Loss (Comment), Financial Problems, Recent negative physical changes, Other (Comment) (Homeless, Loss of Mother, Medical Px, Substance Abuse) Persecutory voices/beliefs?: Yes (degrading voices only, no VH) Depression: Yes Depression Symptoms: Despondent, Insomnia, Tearfulness, Isolating, Fatigue, Guilt, Loss of interest in usual pleasures, Feeling worthless/self pity, Feeling angry/irritable (+ psychosomatic pain, helplessness, hopeless, no motivation) Substance abuse history and/or treatment for substance abuse?: Yes (Etoh & Cocaine abuse) Suicide prevention information given to non-admitted patients: Not applicable  Risk to Others within the past 6 months Homicidal Ideation: No Does patient have any lifetime risk of violence toward others beyond the six months prior to admission? : Yes (comment) ("Only if people come at me first"; he denies serious injury) Thoughts of Harm to Others: No Current  Homicidal Intent: No Current Homicidal Plan: No Access to Homicidal Means: No Identified Victim: n/a History of harm to others?: Yes (Only in past altercations; pt says he never provokes others) Assessment of Violence: None Noted Violent Behavior Description: Pt calm and cooperative during assessment; he reports a past of getting into "non-serious" physical  altercations Does patient have access to weapons?: No Criminal Charges Pending?: No Does patient have a court date: No Is patient on probation?: No  Psychosis Hallucinations: Auditory (by hx (None currently)) Delusions: None noted  Mental Status Report Appearance/Hygiene: Disheveled Eye Contact: Fair Motor Activity: Freedom of movement Speech: Logical/coherent, Soft Level of Consciousness: Quiet/awake Mood: Depressed, Anxious Affect: Anxious Anxiety Level: Moderate Thought Processes: Coherent, Relevant Judgement: Impaired Orientation: Person, Place, Time, Situation Obsessive Compulsive Thoughts/Behaviors: None  Cognitive Functioning Concentration: Decreased Memory: Recent Intact IQ: Average Insight: Fair Impulse Control: Poor Appetite: Poor Weight Loss: 0 (Unknown amount but pt says he believes he has lost) Weight Gain: 0 Sleep: Decreased Total Hours of Sleep: 4 Vegetative Symptoms: Staying in bed, Decreased grooming  ADLScreening [facility] Assessment Services) Patient's cognitive ability adequate to safely complete daily activities?: Yes Patient able to express need for assistance with ADLs?: Yes Independently performs ADLs?: Yes (appropriate for developmental age)  Prior Inpatient Therapy Prior Inpatient Therapy: Yes Prior Therapy Dates: "Multiple places" and dates Prior Therapy Facilty/Provider(s): "Multiple" Reason for Treatment: SI/Depression  Prior Outpatient Therapy Prior Outpatient Therapy: No Prior Therapy Dates: na Prior Therapy Facilty/Provider(s): na Reason for Treatment: na Does patient have an ACCT team?: No Does patient have Intensive In-House Services?  : No Does patient have [facility] services? : Unknown Does patient have P4CC services?: No  ADL Screening (condition at time of admission) Patient's cognitive ability adequate to safely complete daily activities?: Yes Is the patient deaf or have difficulty hearing?: No Does the patient have  difficulty seeing, even when wearing glasses/contacts?: No Does the patient have difficulty concentrating, remembering, or making decisions?: No Patient able to express need for assistance with ADLs?: Yes Does the patient have difficulty dressing or bathing?: No Independently performs ADLs?: Yes (appropriate for developmental age) Does the patient have difficulty walking or climbing stairs?: No Weakness of Legs: None Weakness of Arms/Hands: None  Home Assistive Devices/Equipment Home Assistive Devices/Equipment: None    Abuse/Neglect Assessment (Assessment to be complete while patient is alone) Physical Abuse: Denies Verbal Abuse: Denies Sexual Abuse: Denies Exploitation of patient/patient's resources: Denies Self-Neglect: Denies Values / Beliefs Cultural Requests During Hospitalization: None Spiritual Requests During Hospitalization: None   Advance Directives (For Healthcare) Does patient have an advance directive?: No Would patient like information on creating an advanced directive?: No - patient declined information    Additional Information 1:1 In Past 12 Months?: No CIRT Risk: No Elopement Risk: No Does patient have medical clearance?: Yes     Disposition:  Per Hulan Fess, NP, Inpt tx recommended. Disposition Initial Assessment Completed for this Encounter: Yes Disposition of Patient: Inpatient treatment program Type of inpatient treatment program: Adult  Cyndie Mull, [facility]  04/22/2015 5:11 AM

## 2015-04-22 NOTE — ED Notes (Signed)
The pt reports that he is suicidal he has been drinking alcohol and using cocaine.  His last cocaine and alcohol was earlier tonight

## 2015-04-22 NOTE — ED Notes (Signed)
Security at bedside to wand patient. 

## 2015-04-22 NOTE — ED Notes (Signed)
Pt reports feeling suicidal "for a while", and also reports homelessness.

## 2015-04-22 NOTE — Progress Notes (Signed)
Disposition CSW completed patient referrals to the following inpatient psych facilites:  [facility]  CSW will continue to assist with placement needs.  [facility] Disposition CSW [phone]

## 2015-04-22 NOTE — ED Notes (Signed)
Snack given to pt.

## 2015-04-22 NOTE — ED Notes (Signed)
Staffing office contacted  For sitter need chg rn notified  Changed into scrubs  protocal

## 2015-04-23 ENCOUNTER — Observation Stay (HOSPITAL_COMMUNITY)
Admission: AD | Admit: 2015-04-23 | Payer: Federal, State, Local not specified - Other | Source: Intra-hospital | Admitting: Psychiatry

## 2015-04-23 LAB — CBG MONITORING, ED
GLUCOSE-CAPILLARY: 254 mg/dL — AB (ref 65–99)
Glucose-Capillary: 205 mg/dL — ABNORMAL HIGH (ref 65–99)

## 2015-04-23 NOTE — ED Notes (Signed)
Pt signed consent forms - faxed copy to [facility] - original placed in envelope for [facility].

## 2015-04-23 NOTE — ED Notes (Signed)
Shower supplies given as requested. 

## 2015-04-23 NOTE — BHH Counselor (Signed)
Reassessment by Clinical research associate today. Pt is oriented to person, date (knows month and year but not date of month), place and situation. He reports mood as "down in the dumps". He reports he has slept "on and off". Pt reports good appetite and says he has eaten breakfast and lunch. Pt currently denies SI. However, he in unable to contract for safety. He denies HI. Pt denies [facility] and no delusions noted. Pt denies any withdrawal sxs. TTS continues to seek inpatient placement for pt.  Evette Cristal, [place] Therapeutic Triage Specialist

## 2015-04-23 NOTE — ED Notes (Signed)
TTS being performed.  

## 2015-04-23 NOTE — ED Notes (Signed)
Per Minerva Areola, [facility], pt accepted to [facility] 1 - Dr Dominga Ferry.

## 2015-04-23 NOTE — ED Notes (Signed)
Attempted to call report to [facility] - was asked to call back in a few minutes.

## 2015-06-25 ENCOUNTER — Emergency Department (HOSPITAL_COMMUNITY)
Admission: EM | Admit: 2015-06-25 | Discharge: 2015-06-26 | Disposition: A | Discharge status: Other Acute Inpatient Hospital | Payer: Self-pay | Attending: Emergency Medicine | Admitting: Emergency Medicine

## 2015-06-25 ENCOUNTER — Emergency Department (HOSPITAL_COMMUNITY): Payer: Self-pay

## 2015-06-25 ENCOUNTER — Encounter (HOSPITAL_COMMUNITY): Payer: Self-pay | Admitting: Emergency Medicine

## 2015-06-25 DIAGNOSIS — R45851 Suicidal ideations: Secondary | ICD-10-CM | POA: Insufficient documentation

## 2015-06-25 DIAGNOSIS — R350 Frequency of micturition: Secondary | ICD-10-CM | POA: Insufficient documentation

## 2015-06-25 DIAGNOSIS — F17210 Nicotine dependence, cigarettes, uncomplicated: Secondary | ICD-10-CM | POA: Insufficient documentation

## 2015-06-25 DIAGNOSIS — F1020 Alcohol dependence, uncomplicated: Secondary | ICD-10-CM

## 2015-06-25 DIAGNOSIS — R05 Cough: Secondary | ICD-10-CM | POA: Insufficient documentation

## 2015-06-25 DIAGNOSIS — F329 Major depressive disorder, single episode, unspecified: Secondary | ICD-10-CM | POA: Insufficient documentation

## 2015-06-25 DIAGNOSIS — E119 Type 2 diabetes mellitus without complications: Secondary | ICD-10-CM | POA: Insufficient documentation

## 2015-06-25 DIAGNOSIS — F1094 Alcohol use, unspecified with alcohol-induced mood disorder: Secondary | ICD-10-CM | POA: Diagnosis present

## 2015-06-25 DIAGNOSIS — F1420 Cocaine dependence, uncomplicated: Secondary | ICD-10-CM | POA: Diagnosis present

## 2015-06-25 DIAGNOSIS — F1024 Alcohol dependence with alcohol-induced mood disorder: Secondary | ICD-10-CM | POA: Insufficient documentation

## 2015-06-25 DIAGNOSIS — F331 Major depressive disorder, recurrent, moderate: Secondary | ICD-10-CM | POA: Diagnosis present

## 2015-06-25 LAB — RAPID URINE DRUG SCREEN, HOSP PERFORMED
AMPHETAMINES: NOT DETECTED
BARBITURATES: NOT DETECTED
BENZODIAZEPINES: NOT DETECTED
COCAINE: POSITIVE — AB
Opiates: NOT DETECTED
Tetrahydrocannabinol: NOT DETECTED

## 2015-06-25 LAB — COMPREHENSIVE METABOLIC PANEL
ALBUMIN: 3.7 g/dL (ref 3.5–5.0)
ALK PHOS: 79 U/L (ref 38–126)
ALT: 19 U/L (ref 17–63)
ANION GAP: 9 (ref 5–15)
AST: 20 U/L (ref 15–41)
BILIRUBIN TOTAL: 0.2 mg/dL — AB (ref 0.3–1.2)
BUN: 12 mg/dL (ref 6–20)
CHLORIDE: 99 mmol/L — AB (ref 101–111)
CO2: 27 mmol/L (ref 22–32)
Calcium: 9.5 mg/dL (ref 8.9–10.3)
Creatinine, Ser: 1.1 mg/dL (ref 0.61–1.24)
GFR calc Af Amer: >60 mL/min (ref >60)
GFR calc non Af Amer: >60 mL/min (ref >60)
GLUCOSE: 338 mg/dL — AB (ref 65–99)
Potassium: 3.9 mmol/L (ref 3.5–5.1)
Sodium: 135 mmol/L (ref 135–145)
Total Protein: 6.9 g/dL (ref 6.5–8.1)

## 2015-06-25 LAB — URINE MICROSCOPIC-ADD ON
BACTERIA UA: NONE SEEN
SQUAMOUS EPITHELIAL / LPF: NONE SEEN
WBC, UA: NONE SEEN WBC/hpf (ref 0–5)

## 2015-06-25 LAB — URINALYSIS, ROUTINE W REFLEX MICROSCOPIC
BILIRUBIN URINE: NEGATIVE
Ketones, ur: NEGATIVE mg/dL
Leukocytes, UA: NEGATIVE
Nitrite: NEGATIVE
PROTEIN: NEGATIVE mg/dL
Specific Gravity, Urine: 1.029 (ref 1.005–1.030)
pH: 5.5 (ref 5.0–8.0)

## 2015-06-25 LAB — CBC
HEMATOCRIT: 46.5 % (ref 39.0–52.0)
HEMOGLOBIN: 15.7 g/dL (ref 13.0–17.0)
MCH: 28.6 pg (ref 26.0–34.0)
MCHC: 33.8 g/dL (ref 30.0–36.0)
MCV: 84.7 fL (ref 78.0–100.0)
Platelets: 279 10*3/uL (ref 150–400)
RBC: 5.49 MIL/uL (ref 4.22–5.81)
RDW: 12.2 % (ref 11.5–15.5)
WBC: 7 10*3/uL (ref 4.0–10.5)

## 2015-06-25 LAB — ETHANOL: Alcohol, Ethyl (B): <5 mg/dL (ref <5)

## 2015-06-25 LAB — SALICYLATE LEVEL: Salicylate Lvl: <4 mg/dL (ref 2.8–30.0)

## 2015-06-25 LAB — ACETAMINOPHEN LEVEL

## 2015-06-25 MED ORDER — INSULIN ASPART 100 UNIT/ML ~~LOC~~ SOLN
0.0000 [IU] | Freq: Three times a day (TID) | SUBCUTANEOUS | Status: DC
  Administered 2015-06-26: 3 [IU] via SUBCUTANEOUS
  Administered 2015-06-26: 2 [IU] via SUBCUTANEOUS
  Filled 2015-06-25 (×2): qty 1

## 2015-06-25 MED ORDER — LORAZEPAM 1 MG PO TABS
0.0000 mg | ORAL_TABLET | Freq: Four times a day (QID) | ORAL | Status: DC
  Administered 2015-06-26: 1 mg via ORAL
  Filled 2015-06-25: qty 1

## 2015-06-25 MED ORDER — ACETAMINOPHEN 325 MG PO TABS: 650.0000 mg | ORAL_TABLET | ORAL | Status: DC | PRN

## 2015-06-25 MED ORDER — THIAMINE HCL 100 MG/ML IJ SOLN: 100.0000 mg | Freq: Every day | INTRAMUSCULAR | Status: DC

## 2015-06-25 MED ORDER — LORAZEPAM 1 MG PO TABS: 0.0000 mg | ORAL_TABLET | Freq: Two times a day (BID) | ORAL | Status: DC

## 2015-06-25 MED ORDER — VITAMIN B-1 100 MG PO TABS
100.0000 mg | ORAL_TABLET | Freq: Every day | ORAL | Status: DC
  Administered 2015-06-25 – 2015-06-26 (×2): 100 mg via ORAL
  Filled 2015-06-25 (×2): qty 1

## 2015-06-25 MED ORDER — IBUPROFEN 200 MG PO TABS: 600.0000 mg | ORAL_TABLET | Freq: Three times a day (TID) | ORAL | Status: DC | PRN

## 2015-06-25 MED ORDER — INSULIN ASPART 100 UNIT/ML ~~LOC~~ SOLN
4.0000 [IU] | Freq: Three times a day (TID) | SUBCUTANEOUS | Status: DC
  Administered 2015-06-26 (×2): 4 [IU] via SUBCUTANEOUS
  Filled 2015-06-25 (×2): qty 1

## 2015-06-25 MED ORDER — INSULIN GLARGINE 100 UNIT/ML ~~LOC~~ SOLN
15.0000 [IU] | Freq: Every day | SUBCUTANEOUS | Status: DC
  Administered 2015-06-25: 15 [IU] via SUBCUTANEOUS
  Filled 2015-06-25: qty 0.15

## 2015-06-25 MED ORDER — ONDANSETRON HCL 4 MG PO TABS: 4.0000 mg | ORAL_TABLET | Freq: Three times a day (TID) | ORAL | Status: DC | PRN

## 2015-06-25 NOTE — ED Notes (Signed)
Pt. To [facility] from ED ambulatory without difficulty, to room 36 . Report from Tiffany RN. Pt. Is alert and oriented, warm and dry in no distress. Pt. Denies SI, HI, and AVH. Pt. Calm and cooperative. Pt. Made aware of security cameras and Q15 minute rounds. Pt. Encouraged to let Nursing staff know of any concerns or needs.  

## 2015-06-25 NOTE — ED Notes (Signed)
Pt brought in by [employer] under IVC papers that [facility].  [facility] papers that if pt is insulin dependent then [facility] can't [facility]. Pt quit taking insulin in July due to "can't offered it".   IVC papers state: "Respondent is a 49yo male who walked into [facility] reporting increased feelings of depression and SI.  He states that he is homeless and was locked out of his house by his wife.  He has been diagnosed with depression but takes no medication. He reports using "a lot of alcohol" and crack cocaine yesterday.  He reports wanting to walk into traffic.  He has had a suicide attempt in past.  He is considered a risk to himself."

## 2015-06-25 NOTE — ED Provider Notes (Signed)
CSN: [id number]     Arrival date & time 06/25/15  1828 History   First MD Initiated Contact with Patient 06/25/15 1835     Chief Complaint  Patient presents with  . IVC   . Medical Clearance     (Consider location/radiation/quality/duration/timing/severity/associated sxs/prior Treatment) The history is provided by the patient.   patient presents with suicidal thoughts. States he's been depressed for a while and has been drinking alcohol and occasionally smoking some crack. States his wife kicked him out of the house and she is bad to him. States has gotten worse and is more depressed now. He has some suicidal thoughts but no plan. Send in from [place] under IVC. Has been out of his insulin for a month. Could not afford. Reportedly has had some urinary frequency. Has had an occasional cough with some "cold" coming up. He cannot go back to [place] because he is on insulin.  Past Medical History  Diagnosis Date  . Suicidal ideations   . Diabetes mellitus without complication (HCC)    History reviewed. No pertinent past surgical history. No family history on file. Social History  Substance Use Topics  . Smoking status: Current Every Day Smoker -- 1.00 packs/day    Types: Cigarettes  . Smokeless tobacco: None  . Alcohol Use: Yes    Review of Systems  Constitutional: Negative for activity change and appetite change.  Eyes: Negative for pain.  Respiratory: Positive for cough. Negative for chest tightness and shortness of breath.   Cardiovascular: Negative for chest pain and leg swelling.  Gastrointestinal: Negative for nausea, vomiting, abdominal pain and diarrhea.  Genitourinary: Positive for frequency. Negative for flank pain.  Musculoskeletal: Negative for back pain and neck stiffness.  Skin: Negative for rash.  Neurological: Negative for weakness, numbness and headaches.  Psychiatric/Behavioral: Positive for suicidal ideas and dysphoric mood. Negative for behavioral problems.       Allergies  Review of patient's allergies indicates no known allergies.  Home Medications   Prior to Admission medications   Medication Sig Start Date End Date Taking? Authorizing Provider  Continuous Glucose Monitor DEVI Please provide with affordable device, alcohol swabs. Needles and strips. Patient not taking: Reported on 06/25/2015 05/02/14   Elson Areas, PA-C  insulin glargine (LANTUS) 100 UNIT/ML injection 15 units in am Patient not taking: Reported on 06/25/2015 05/02/14   Elson Areas, PA-C  Insulin Pen Needle 29G X MISC Please provide insulin needles Patient not taking: Reported on 06/25/2015 05/02/14   Elson Areas, PA-C  insulin regular (HUMULIN R) 100 units/mL injection Inject 0.05 mLs (5 Units total) into the skin 3 (three) times daily before meals. Patient not taking: Reported on 06/25/2015 05/02/14   Elson Areas, PA-C   BP 116/82 mmHg  Pulse 90  Temp(Src) 98.7 F (37.1 C) (Oral)  Resp 16  SpO2 98% Physical Exam  Constitutional: He is oriented to person, place, and time. He appears well-developed and well-nourished.  HENT:  Head: Normocephalic and atraumatic.  Eyes: Pupils are equal, round, and reactive to light.  Neck: Normal range of motion. Neck supple.  Cardiovascular: Normal rate, regular rhythm and normal heart sounds.   Pulmonary/Chest: Effort normal and breath sounds normal.  Abdominal: Soft. Bowel sounds are normal. He exhibits no distension. There is no tenderness.  Musculoskeletal: Normal range of motion. He exhibits no edema.  Neurological: He is alert and oriented to person, place, and time. No cranial nerve deficit.  Skin: Skin is warm and dry.  Psychiatric:  Patient appears somewhat depressed.  Nursing note and vitals reviewed.   ED Course  Procedures (including critical care time) Labs Review Labs Reviewed  COMPREHENSIVE METABOLIC PANEL - Abnormal; Notable for the following:    Chloride 99 (*)    Glucose, Bld 338 (*)     Total Bilirubin 0.2 (*)    All other components within normal limits  ACETAMINOPHEN LEVEL - Abnormal; Notable for the following:    Acetaminophen (Tylenol), Serum <10 (*)    All other components within normal limits  URINE RAPID DRUG SCREEN, HOSP PERFORMED - Abnormal; Notable for the following:    Cocaine POSITIVE (*)    All other components within normal limits  URINALYSIS, ROUTINE W REFLEX MICROSCOPIC (NOT AT [facility]) - Abnormal; Notable for the following:    Glucose, UA >1000 (*)    Hgb urine dipstick TRACE (*)    All other components within normal limits  ETHANOL  SALICYLATE LEVEL  CBC  URINE MICROSCOPIC-ADD ON    Imaging Review Dg Chest 2 View  06/25/2015  CLINICAL DATA:  Cough and cold like symptoms for 1 week. No chest pain. History of diabetes. Smoker. EXAM: CHEST  2 VIEW COMPARISON:  None. FINDINGS: The heart size and mediastinal contours are within normal limits. Both lungs are clear. Increased perihilar markings consistent with chronic bronchitis. No hyperinflation. No osseous findings. IMPRESSION: Chronic bronchitic change.  No active infiltrates are noted. Electronically Signed   By: Elsie StainJohn T Curnes M.D.   On: 06/25/2015 20:12   I have personally reviewed and evaluated these images and lab results as part of my medical decision-making.   EKG Interpretation None      MDM   Final diagnoses:  Depression  Cocaine dependence without complication (HCC)  Uncomplicated alcohol dependence (HCC)    Patient with depression. Also alcoholic cocaine use. Has been off his insulin. Mild hyperglycemia will be started back on insulin. Has been accepted at behavioral health but there are no available beds.    Benjiman CoreNathan Amar Sippel, MD 06/25/15 [phone]

## 2015-06-25 NOTE — ED Notes (Signed)
Pt calm and cooperative upon rounding. VSS. Lights dimmed for comfort. Pt resting. Will continue to monitor.
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
## 2015-06-25 NOTE — [facility] Counselor (Signed)
06/25/15 [facility] sent to [facility], [facility], [facility], [facility], [place], [place], [place], [address], [address], [address] [place], [place], [place], [place], [place], [facility], [place], and [facility]  Lukka Black K. Sherlon HandingHarris, LCAS-A, LPC-A, [facility]  Counselor 06/25/2015 11:47 PM

## 2015-06-25 NOTE — BH Assessment (Signed)
Received notification of TTS consult request. Spoke to Dr. Benjiman CoreNathan Pickering who said Pt is abusing crack and alcohol and is suicidal with no active plan . Tele-assessment will be initiated.  Harlin RainFord Ellis Patsy BaltimoreWarrick Jr, LPC, [facility], [facility] Triage Specialist [phone]

## 2015-06-25 NOTE — ED Notes (Signed)
This RN called lab to check status of ethanol, acetaminophen, and salicylate. Crystal reports she is getting ready to run it now.

## 2015-06-26 ENCOUNTER — Encounter (HOSPITAL_COMMUNITY): Payer: Self-pay | Admitting: *Deleted

## 2015-06-26 ENCOUNTER — Observation Stay (HOSPITAL_COMMUNITY)
Admission: AD | Admit: 2015-06-26 | Discharge: 2015-06-27 | Disposition: A | Discharge status: Home / Self Care | Payer: Federal, State, Local not specified - Other | Source: Intra-hospital | Attending: Psychiatry | Admitting: Psychiatry

## 2015-06-26 DIAGNOSIS — Z590 Homelessness: Secondary | ICD-10-CM | POA: Insufficient documentation

## 2015-06-26 DIAGNOSIS — F1420 Cocaine dependence, uncomplicated: Secondary | ICD-10-CM

## 2015-06-26 DIAGNOSIS — F17210 Nicotine dependence, cigarettes, uncomplicated: Secondary | ICD-10-CM | POA: Insufficient documentation

## 2015-06-26 DIAGNOSIS — F1024 Alcohol dependence with alcohol-induced mood disorder: Principal | ICD-10-CM | POA: Insufficient documentation

## 2015-06-26 DIAGNOSIS — F1020 Alcohol dependence, uncomplicated: Secondary | ICD-10-CM

## 2015-06-26 DIAGNOSIS — E119 Type 2 diabetes mellitus without complications: Secondary | ICD-10-CM | POA: Diagnosis not present

## 2015-06-26 DIAGNOSIS — F1410 Cocaine abuse, uncomplicated: Secondary | ICD-10-CM | POA: Diagnosis not present

## 2015-06-26 DIAGNOSIS — F1994 Other psychoactive substance use, unspecified with psychoactive substance-induced mood disorder: Secondary | ICD-10-CM | POA: Diagnosis not present

## 2015-06-26 DIAGNOSIS — Z794 Long term (current) use of insulin: Secondary | ICD-10-CM | POA: Insufficient documentation

## 2015-06-26 DIAGNOSIS — F1094 Alcohol use, unspecified with alcohol-induced mood disorder: Secondary | ICD-10-CM | POA: Diagnosis present

## 2015-06-26 LAB — GLUCOSE, CAPILLARY
GLUCOSE-CAPILLARY: 221 mg/dL — AB (ref 65–99)
GLUCOSE-CAPILLARY: 176 mg/dL — AB (ref 65–99)

## 2015-06-26 LAB — CBG MONITORING, ED
Glucose-Capillary: 199 mg/dL — ABNORMAL HIGH (ref 65–99)
Glucose-Capillary: 146 mg/dL — ABNORMAL HIGH (ref 65–99)

## 2015-06-26 MED ORDER — INSULIN ASPART 100 UNIT/ML ~~LOC~~ SOLN
3.0000 [IU] | Freq: Three times a day (TID) | SUBCUTANEOUS | Status: DC
  Administered 2015-06-26 – 2015-06-27 (×2): 3 [IU] via SUBCUTANEOUS

## 2015-06-26 MED ORDER — INSULIN ASPART 100 UNIT/ML ~~LOC~~ SOLN
0.0000 [IU] | Freq: Three times a day (TID) | SUBCUTANEOUS | Status: DC
  Administered 2015-06-26 – 2015-06-27 (×2): 3 [IU] via SUBCUTANEOUS

## 2015-06-26 MED ORDER — HYDROXYZINE HCL 25 MG PO TABS: 25.0000 mg | ORAL_TABLET | Freq: Four times a day (QID) | ORAL | Status: DC | PRN

## 2015-06-26 MED ORDER — MAGNESIUM HYDROXIDE 400 MG/5ML PO SUSP: 30.0000 mL | Freq: Every day | ORAL | Status: DC | PRN

## 2015-06-26 MED ORDER — TRAZODONE HCL 50 MG PO TABS
50.0000 mg | ORAL_TABLET | Freq: Every evening | ORAL | Status: DC | PRN
  Filled 2015-06-26: qty 30

## 2015-06-26 MED ORDER — LORAZEPAM 1 MG PO TABS: 1.0000 mg | ORAL_TABLET | Freq: Four times a day (QID) | ORAL | Status: DC | PRN

## 2015-06-26 MED ORDER — FLUOXETINE HCL 20 MG PO CAPS
20.0000 mg | ORAL_CAPSULE | Freq: Every day | ORAL | Status: DC
  Administered 2015-06-26: 20 mg via ORAL
  Filled 2015-06-26: qty 1

## 2015-06-26 MED ORDER — ALUM & MAG HYDROXIDE-SIMETH 200-200-20 MG/5ML PO SUSP: 30.0000 mL | ORAL | Status: DC | PRN

## 2015-06-26 MED ORDER — ACETAMINOPHEN 325 MG PO TABS: 650.0000 mg | ORAL_TABLET | Freq: Four times a day (QID) | ORAL | Status: DC | PRN

## 2015-06-26 NOTE — Progress Notes (Signed)
  D: Pt doesn't discuss much with the writer, however, did discuss his situation with the counselor. Pt active in daily needs, aeb requesting and getting toiletries, showering, and clean scrubs. When told that [facility] doesn't have certain items, pt grumbles, or says "dog".  Pt speaks to counselor and advocates for himself.   A:  Support and encouragement was offered. 15 min checks continued for safety.  R: Pt remains safe.

## 2015-06-26 NOTE — Consult Note (Signed)
[place] Consult   Reason for Consult:  Depression and substance abuse Referring Physician:  EDP Patient Identification: [name] Kennedy MRN:  [id number] Principal Diagnosis: Alcohol-induced mood disorder ([place]) Diagnosis:   Patient Active Problem List   Diagnosis Date Noted  . Alcohol-induced mood disorder ([place]) [F10.94] 06/26/2015    Priority: High  . Alcohol dependence ([place]) [F10.20] 01/24/2013    Priority: High  . Cocaine dependence ([place]) [F14.20] 01/24/2013    Priority: High  . Suicidal ideation [R45.851] 01/23/2013    Priority: High    Total Time spent with patient: 45 minutes  Subjective:   [name] Kennedy is a 49 y.o. male patient admitted to [facility].  HPI:  49 yo male who presented to the ED via IVC from [facility] because he had substance with depression but also had diabetes that they cannot manage.  History of substance abuse, cocaine and alcohol.  Irritable on exam, denies suicidal/homicidal ideations, hallucinations, and withdrawal symptoms.  He is homeless as his wife "kicked" him out due to his substance abuse.  Past Psychiatric History: Alcohol induced mood disorder  Risk to Self: Suicidal Ideation: Yes-Currently Present Suicidal Intent: No Is patient at risk for suicide?: Yes Suicidal Plan?: No Access to Means: No What has been your use of drugs/alcohol within the last 12 months?: Pt is abusing alcohol and cocaine How many times?: 1 (Attempted to hang himself as an adolescent) Other Self Harm Risks: None Triggers for Past Attempts: Family contact Intentional Self Injurious Behavior: None Risk to Others: Homicidal Ideation: No Thoughts of Harm to Others: No Current Homicidal Intent: No Current Homicidal Plan: No Access to Homicidal Means: No Identified Victim: None History of harm to others?: No Assessment of Violence: None Noted Violent Behavior Description: Pt denies history of violence Does patient have access to weapons?:  No Criminal Charges Pending?: No Does patient have a court date: No Prior Inpatient Therapy: Prior Inpatient Therapy: Yes Prior Therapy Dates: 2013, multiple admits Prior Therapy Facilty/Provider(s): [facility], other facilities Reason for Treatment: Depression and substance abuse Prior Outpatient Therapy: Prior Outpatient Therapy: Yes Prior Therapy Dates: unknown Prior Therapy Facilty/Provider(s): [facility] Reason for Treatment: Depression and substance abuse Does patient have an ACCT team?: No Does patient have Intensive In-House Services?  : No Does patient have [facility] services? : Yes Does patient have P4CC services?: No  Past Medical History:  Past Medical History  Diagnosis Date  . Suicidal ideations   . Diabetes mellitus without complication ([place])    History reviewed. No pertinent past surgical history. Family History: No family history on file. Family Psychiatric  History: none Social History:  History  Alcohol Use  . Yes     History  Drug Use  . Yes  . Special: Cocaine    Social History   Social History  . Marital Status: Single    Spouse Name: N/A  . Number of Children: N/A  . Years of Education: N/A   Social History Main Topics  . Smoking status: Current Every Day Smoker -- 1.00 packs/day    Types: Cigarettes  . Smokeless tobacco: None  . Alcohol Use: Yes  . Drug Use: Yes    Special: Cocaine  . Sexual Activity: Not Asked   Other Topics Concern  . None   Social History Narrative   Additional Social History:    Pain Medications: Denies abuse Prescriptions: None Over the Counter: Denies abuse History of alcohol / drug use?: Yes Longest period of sobriety (when/how long): One year  Negative Consequences of Use: Financial, Personal relationships Withdrawal Symptoms: Tremors, Nausea / Vomiting Name of Substance 1: Alcohol 1 - Age of First Use: 9 1 - Amount (size/oz): 3-4 bottles of 40-ounce beer 1 - Frequency: Daily 1 - Duration: Ongoing 1 - Last  Use / Amount: 06/24/15, 4 bottles of 40-ounce beer Name of Substance 2: Crack 2 - Age of First Use: 21 2 - Amount (size/oz): $30-40 worth 2 - Frequency: 2-3 times per month 2 - Duration: Ongoing 2 - Last Use / Amount: 06/24/15, $30 worth                 Allergies:  No Known Allergies  Labs:  Results for orders placed or performed during the hospital encounter of 06/25/15 (from the past 48 hour(s))  Comprehensive metabolic panel     Status: Abnormal   Collection Time: 06/25/15  7:13 PM  Result Value Ref Range   Sodium 135 135 - 145 mmol/L   Potassium 3.9 3.5 - 5.1 mmol/L   Chloride 99 (L) 101 - 111 mmol/L   CO2 27 22 - 32 mmol/L   Glucose, Bld 338 (H) 65 - 99 mg/dL   BUN 12 6 - 20 mg/dL   Creatinine, Ser 1.10 0.61 - 1.24 mg/dL   Calcium 9.5 8.9 - 10.3 mg/dL   Total Protein 6.9 6.5 - 8.1 g/dL   Albumin 3.7 3.5 - 5.0 g/dL   AST 20 15 - 41 U/L   ALT 19 17 - 63 U/L   Alkaline Phosphatase 79 38 - 126 U/L   Total Bilirubin 0.2 (L) 0.3 - 1.2 mg/dL   GFR calc non Af Amer >60 >60 mL/min   GFR calc Af Amer >60 >60 mL/min    Comment: (NOTE) The eGFR has been calculated using the CKD EPI equation. This calculation has not been validated in all clinical situations. eGFR's persistently <60 mL/min signify possible Chronic Kidney Disease.    Anion gap 9 5 - 15  CBC     Status: None   Collection Time: 06/25/15  7:13 PM  Result Value Ref Range   WBC 7.0 4.0 - 10.5 K/uL   RBC 5.49 4.22 - 5.81 MIL/uL   Hemoglobin 15.7 13.0 - 17.0 g/dL   HCT 46.5 39.0 - 52.0 %   MCV 84.7 78.0 - 100.0 fL   MCH 28.6 26.0 - 34.0 pg   MCHC 33.8 30.0 - 36.0 g/dL   RDW 12.2 11.5 - 15.5 %   Platelets 279 150 - 400 K/uL  Ethanol (ETOH)     Status: None   Collection Time: 06/25/15  7:14 PM  Result Value Ref Range   Alcohol, Ethyl (B) <5 <5 mg/dL    Comment:        LOWEST DETECTABLE LIMIT FOR SERUM ALCOHOL IS 5 mg/dL FOR MEDICAL PURPOSES ONLY   Salicylate level     Status: None   Collection Time:  06/25/15  7:14 PM  Result Value Ref Range   Salicylate Lvl <8.1 2.8 - 30.0 mg/dL  Acetaminophen level     Status: Abnormal   Collection Time: 06/25/15  7:14 PM  Result Value Ref Range   Acetaminophen (Tylenol), Serum <10 (L) 10 - 30 ug/mL    Comment:        THERAPEUTIC CONCENTRATIONS VARY SIGNIFICANTLY. A RANGE OF 10-30 ug/mL MAY BE AN EFFECTIVE CONCENTRATION FOR MANY PATIENTS. HOWEVER, SOME ARE BEST TREATED AT CONCENTRATIONS OUTSIDE THIS RANGE. ACETAMINOPHEN CONCENTRATIONS >150 ug/mL AT 4 HOURS AFTER INGESTION AND >  50 ug/mL AT 12 HOURS AFTER INGESTION ARE OFTEN ASSOCIATED WITH TOXIC REACTIONS.   Urine rapid drug screen (hosp performed) (Not at [facility])     Status: Abnormal   Collection Time: 06/25/15  7:26 PM  Result Value Ref Range   Opiates NONE DETECTED NONE DETECTED   Cocaine POSITIVE (A) NONE DETECTED   Benzodiazepines NONE DETECTED NONE DETECTED   Amphetamines NONE DETECTED NONE DETECTED   Tetrahydrocannabinol NONE DETECTED NONE DETECTED   Barbiturates NONE DETECTED NONE DETECTED    Comment:        DRUG SCREEN FOR MEDICAL PURPOSES ONLY.  IF CONFIRMATION IS NEEDED FOR ANY PURPOSE, NOTIFY LAB WITHIN 5 DAYS.        LOWEST DETECTABLE LIMITS FOR URINE DRUG SCREEN Drug Class       Cutoff (ng/mL) Amphetamine      1000 Barbiturate      200 Benzodiazepine   409 Tricyclics       811 Opiates          300 Cocaine          300 THC              50   Urinalysis, Routine w reflex microscopic (not at [facility])     Status: Abnormal   Collection Time: 06/25/15  7:26 PM  Result Value Ref Range   Color, Urine YELLOW YELLOW   APPearance CLEAR CLEAR   Specific Gravity, Urine 1.029 1.005 - 1.030   pH 5.5 5.0 - 8.0   Glucose, UA >1000 (A) NEGATIVE mg/dL   Hgb urine dipstick TRACE (A) NEGATIVE   Bilirubin Urine NEGATIVE NEGATIVE   Ketones, ur NEGATIVE NEGATIVE mg/dL   Protein, ur NEGATIVE NEGATIVE mg/dL   Nitrite NEGATIVE NEGATIVE   Leukocytes, UA NEGATIVE NEGATIVE  Urine  microscopic-add on     Status: None   Collection Time: 06/25/15  7:26 PM  Result Value Ref Range   Squamous Epithelial / LPF NONE SEEN NONE SEEN    Comment: Please note change in reference range.   WBC, UA NONE SEEN 0 - 5 WBC/hpf    Comment: Please note change in reference range.   RBC / HPF 0-5 0 - 5 RBC/hpf    Comment: Please note change in reference range.   Bacteria, UA NONE SEEN NONE SEEN    Comment: Please note change in reference range.  CBG monitoring, ED     Status: Abnormal   Collection Time: 06/26/15  8:40 AM  Result Value Ref Range   Glucose-Capillary 199 (H) 65 - 99 mg/dL    Current Facility-Administered Medications  Medication Dose Route Frequency Provider Last Rate Last Dose  . acetaminophen (TYLENOL) tablet 650 mg  650 mg Oral Q4H PRN Davonna Belling, MD      . FLUoxetine (PROZAC) capsule 20 mg  20 mg Oral Daily Jahmeek Shirk      . ibuprofen (ADVIL,MOTRIN) tablet 600 mg  600 mg Oral Q8H PRN Davonna Belling, MD      . insulin aspart (novoLOG) injection 0-15 Units  0-15 Units Subcutaneous TID WC Davonna Belling, MD   3 Units at 06/26/15 [phone]  . insulin aspart (novoLOG) injection 4 Units  4 Units Subcutaneous TID WC Davonna Belling, MD   4 Units at 06/26/15 [phone]  . insulin glargine (LANTUS) injection 15 Units  15 Units Subcutaneous QHS Davonna Belling, MD   15 Units at 06/25/15 2239  . LORazepam (ATIVAN) tablet 0-4 mg  0-4 mg Oral 4 times per day Ovid Curd  Alvino Chapel, MD   Stopped at 06/26/15 [id number]   Followed by  . [START ON 06/28/2015] LORazepam (ATIVAN) tablet 0-4 mg  0-4 mg Oral Q12H Davonna Belling, MD      . ondansetron [facility]) tablet 4 mg  4 mg Oral Q8H PRN Davonna Belling, MD      . thiamine (VITAMIN B-1) tablet 100 mg  100 mg Oral Daily Davonna Belling, MD   100 mg at 06/26/15 1157   Or  . thiamine (B-1) injection 100 mg  100 mg Intravenous Daily Davonna Belling, MD       Current Outpatient Prescriptions  Medication Sig Dispense Refill  . Continuous  Glucose Monitor DEVI Please provide with affordable device, alcohol swabs. Needles and strips. (Patient not taking: Reported on 06/25/2015) 1 each 0  . insulin glargine (LANTUS) 100 UNIT/ML injection 15 units in am (Patient not taking: Reported on 06/25/2015) 10 mL 11  . Insulin Pen Needle 29G X 8MM MISC Please provide insulin needles (Patient not taking: Reported on 06/25/2015) 100 each 1  . insulin regular (HUMULIN R) 100 units/mL injection Inject 0.05 mLs (5 Units total) into the skin 3 (three) times daily before meals. (Patient not taking: Reported on 06/25/2015) 10 mL 11    Musculoskeletal: Strength & Muscle Tone: within normal limits Gait & Station: normal Patient leans: N/A  Psychiatric Specialty Exam: Review of Systems  Constitutional: Negative.   HENT: Negative.   Eyes: Negative.   Respiratory: Negative.   Cardiovascular: Negative.   Gastrointestinal: Negative.   Genitourinary: Negative.   Musculoskeletal: Negative.   Skin: Negative.   Neurological: Negative.   Endo/Heme/Allergies: Negative.   Psychiatric/Behavioral: Positive for depression and substance abuse.    Blood pressure 101/63, pulse 78, temperature 98.2 F (36.8 C), temperature source Oral, resp. rate 18, SpO2 99 %.There is no weight on file to calculate BMI.  General Appearance: Casual  Eye Contact::  Good  Speech:  Normal Rate  Volume:  Normal  Mood:  Depressed  Affect:  Congruent  Thought Process:  Coherent  Orientation:  Full (Time, Place, and Person)  Thought Content:  WDL  Suicidal Thoughts:  No  Homicidal Thoughts:  No  Memory:  Immediate;   Good Recent;   Good Remote;   Good  Judgement:  Fair  Insight:  Fair  Psychomotor Activity:  Normal  Concentration:  Good  Recall:  Good  Fund of Knowledge:Good  Language: Good  Akathisia:  No  Handed:  Right  AIMS (if indicated):     Assets:  Leisure Time Physical Health Resilience  ADL's:  Intact  Cognition: WNL  Sleep:      Treatment Plan  Summary: Daily contact with patient to assess and evaluate symptoms and progress in treatment, Medication management and Plan Alcohol induced mood disorder:  -Crisis stabilization -Medication management:  Ativan alcohol detox protocol in place -Individual and substance abuse counseling  Disposition: Supportive therapy provided about ongoing stressors.  Waylan Boga, [place] 06/26/2015 11:05 AM Patient seen face-to-face for psychiatric evaluation, chart reviewed and case discussed with the physician extender and developed treatment plan. Reviewed the information documented and agree with the treatment plan. Corena Pilgrim, MD

## 2015-06-26 NOTE — ED Notes (Signed)
Pt. Noted sleeping in room. No complaints or concerns voiced. No distress or abnormal behavior noted. Will continue to monitor with security cameras. Q 15 minute rounds continue. 

## 2015-06-26 NOTE — H&P (Signed)
[place] [facility] H & P   Patient Identification: [name] Kennedy MRN: [id number] Principal Diagnosis: Alcohol-induced mood disorder [facility]) Diagnosis:  Patient Active Problem List   Diagnosis Date Noted  . Alcohol-induced mood disorder ([place]) [F10.94] 06/26/2015    Priority: High  . Alcohol dependence ([place]) [F10.20] 01/24/2013    Priority: High  . Cocaine dependence ([place]) [F14.20] 01/24/2013    Priority: High  . Suicidal ideation [R45.851] 01/23/2013    Priority: High    Total Time spent with patient: 45 minutes  Subjective:  [name] Kennedy is a 49 y.o. male patient admitted to [facility] Observation Unit.  HPI: 49 yo male who presented to the ED via IVC from [facility] because he had substance with depression but also had diabetes that they cannot manage. History of substance abuse, cocaine and alcohol. Irritable on exam, denies suicidal/homicidal ideations, hallucinations, and withdrawal symptoms. He is homeless as his wife "kicked" him out due to his substance abuse. Patient is especially irritable when detailed questions are asked during the statement such as specific depressive symptoms as he stated "I told you I was depressed. That is all there is. I drink alcohol everyday to deal with it. I am homeless now because my wife is so crazy. I need a 90 day program to go to when I leave here." His urine drug screen is positive for cocaine and alcohol level is less than five.   Past Psychiatric History: Alcohol induced mood disorder  Risk to Self: Suicidal Ideation: Yes-Currently Present Suicidal Intent: No Is patient at risk for suicide?: Yes Suicidal Plan?: No Access to Means: No What has been your use of drugs/alcohol within the last 12 months?: Pt is abusing alcohol and cocaine How many times?: 1 (Attempted to hang himself as an adolescent) Other Self Harm Risks: None Triggers for Past Attempts: Family contact Intentional Self Injurious Behavior:  None Risk to Others: Homicidal Ideation: No Thoughts of Harm to Others: No Current Homicidal Intent: No Current Homicidal Plan: No Access to Homicidal Means: No Identified Victim: None History of harm to others?: No Assessment of Violence: None Noted Violent Behavior Description: Pt denies history of violence Does patient have access to weapons?: No Criminal Charges Pending?: No Does patient have a court date: No Prior Inpatient Therapy: Prior Inpatient Therapy: Yes Prior Therapy Dates: 2013, multiple admits Prior Therapy Facilty/Provider(s): [facility], other facilities Reason for Treatment: Depression and substance abuse Prior Outpatient Therapy: Prior Outpatient Therapy: Yes Prior Therapy Dates: unknown Prior Therapy Facilty/Provider(s): [facility] Reason for Treatment: Depression and substance abuse Does patient have an ACCT team?: No Does patient have Intensive In-House Services? : No Does patient have [facility] services? : Yes Does patient have P4CC services?: No  Past Medical History:  Past Medical History  Diagnosis Date  . Suicidal ideations   . Diabetes mellitus without complication ([place])    History reviewed. No pertinent past surgical history. Family History: No family history on file. Family Psychiatric History: none Social History:  History  Alcohol Use  . Yes    History  Drug Use  . Yes  . Special: Cocaine    Social History   Social History  . Marital Status: Single    Spouse Name: N/A  . Number of Children: N/A  . Years of Education: N/A   Social History Main Topics  . Smoking status: Current Every Day Smoker -- 1.00 packs/day    Types: Cigarettes  . Smokeless tobacco: None  . Alcohol Use: Yes  . Drug Use: Yes  Special: Cocaine  . Sexual Activity: Not Asked   Other Topics Concern  . None   Social History Narrative   Additional Social History:   Pain  Medications: Denies abuse Prescriptions: None Over the Counter: Denies abuse History of alcohol / drug use?: Yes Longest period of sobriety (when/how long): One year Negative Consequences of Use: Financial, Personal relationships Withdrawal Symptoms: Tremors, Nausea / Vomiting Name of Substance 1: Alcohol 1 - Age of First Use: 9 1 - Amount (size/oz): 3-4 bottles of 40-ounce beer 1 - Frequency: Daily 1 - Duration: Ongoing 1 - Last Use / Amount: 06/24/15, 4 bottles of 40-ounce beer Name of Substance 2: Crack 2 - Age of First Use: 21 2 - Amount (size/oz): $30-40 worth 2 - Frequency: 2-3 times per month 2 - Duration: Ongoing 2 - Last Use / Amount: 06/24/15, $30 worth                 Allergies: No Known Allergies  Labs:   Lab Results Last 48 Hours    Results for orders placed or performed during the hospital encounter of 06/25/15 (from the past 48 hour(s))  Comprehensive metabolic panel Status: Abnormal   Collection Time: 06/25/15 7:13 PM  Result Value Ref Range   Sodium 135 135 - 145 mmol/L   Potassium 3.9 3.5 - 5.1 mmol/L   Chloride 99 (L) 101 - 111 mmol/L   CO2 27 22 - 32 mmol/L   Glucose, Bld 338 (H) 65 - 99 mg/dL   BUN 12 6 - 20 mg/dL   Creatinine, Ser 1.10 0.61 - 1.24 mg/dL   Calcium 9.5 8.9 - 10.3 mg/dL   Total Protein 6.9 6.5 - 8.1 g/dL   Albumin 3.7 3.5 - 5.0 g/dL   AST 20 15 - 41 U/L   ALT 19 17 - 63 U/L   Alkaline Phosphatase 79 38 - 126 U/L   Total Bilirubin 0.2 (L) 0.3 - 1.2 mg/dL   GFR calc non Af Amer >60 >60 mL/min   GFR calc Af Amer >60 >60 mL/min    Comment: (NOTE) The eGFR has been calculated using the CKD EPI equation. This calculation has not been validated in all clinical situations. eGFR's persistently <60 mL/min signify possible Chronic Kidney Disease.    Anion gap 9 5 - 15  CBC Status: None   Collection Time: 06/25/15 7:13 PM  Result Value Ref  Range   WBC 7.0 4.0 - 10.5 K/uL   RBC 5.49 4.22 - 5.81 MIL/uL   Hemoglobin 15.7 13.0 - 17.0 g/dL   HCT 46.5 39.0 - 52.0 %   MCV 84.7 78.0 - 100.0 fL   MCH 28.6 26.0 - 34.0 pg   MCHC 33.8 30.0 - 36.0 g/dL   RDW 12.2 11.5 - 15.5 %   Platelets 279 150 - 400 K/uL  Ethanol (ETOH) Status: None   Collection Time: 06/25/15 7:14 PM  Result Value Ref Range   Alcohol, Ethyl (B) <5 <5 mg/dL    Comment:   LOWEST DETECTABLE LIMIT FOR SERUM ALCOHOL IS 5 mg/dL FOR MEDICAL PURPOSES ONLY   Salicylate level Status: None   Collection Time: 06/25/15 7:14 PM  Result Value Ref Range   Salicylate Lvl <4.2 2.8 - 30.0 mg/dL  Acetaminophen level Status: Abnormal   Collection Time: 06/25/15 7:14 PM  Result Value Ref Range   Acetaminophen (Tylenol), Serum <10 (L) 10 - 30 ug/mL    Comment:   THERAPEUTIC CONCENTRATIONS VARY SIGNIFICANTLY. A RANGE OF 10-30 ug/mL MAY  BE AN EFFECTIVE CONCENTRATION FOR MANY PATIENTS. HOWEVER, SOME ARE BEST TREATED AT CONCENTRATIONS OUTSIDE THIS RANGE. ACETAMINOPHEN CONCENTRATIONS >150 ug/mL AT 4 HOURS AFTER INGESTION AND >50 ug/mL AT 12 HOURS AFTER INGESTION ARE OFTEN ASSOCIATED WITH TOXIC REACTIONS.   Urine rapid drug screen (hosp performed) (Not at [facility]) Status: Abnormal   Collection Time: 06/25/15 7:26 PM  Result Value Ref Range   Opiates NONE DETECTED NONE DETECTED   Cocaine POSITIVE (A) NONE DETECTED   Benzodiazepines NONE DETECTED NONE DETECTED   Amphetamines NONE DETECTED NONE DETECTED   Tetrahydrocannabinol NONE DETECTED NONE DETECTED   Barbiturates NONE DETECTED NONE DETECTED    Comment:   DRUG SCREEN FOR MEDICAL PURPOSES ONLY. IF CONFIRMATION IS NEEDED FOR ANY PURPOSE, NOTIFY LAB WITHIN 5 DAYS.   LOWEST DETECTABLE LIMITS FOR URINE DRUG SCREEN Drug Class Cutoff (ng/mL) Amphetamine  1000 Barbiturate 200 Benzodiazepine 202 Tricyclics 542 Opiates 706 Cocaine 300 THC 50   Urinalysis, Routine w reflex microscopic (not at [facility]) Status: Abnormal   Collection Time: 06/25/15 7:26 PM  Result Value Ref Range   Color, Urine YELLOW YELLOW   APPearance CLEAR CLEAR   Specific Gravity, Urine 1.029 1.005 - 1.030   pH 5.5 5.0 - 8.0   Glucose, UA >1000 (A) NEGATIVE mg/dL   Hgb urine dipstick TRACE (A) NEGATIVE   Bilirubin Urine NEGATIVE NEGATIVE   Ketones, ur NEGATIVE NEGATIVE mg/dL   Protein, ur NEGATIVE NEGATIVE mg/dL   Nitrite NEGATIVE NEGATIVE   Leukocytes, UA NEGATIVE NEGATIVE  Urine microscopic-add on Status: None   Collection Time: 06/25/15 7:26 PM  Result Value Ref Range   Squamous Epithelial / LPF NONE SEEN NONE SEEN    Comment: Please note change in reference range.   WBC, UA NONE SEEN 0 - 5 WBC/hpf    Comment: Please note change in reference range.   RBC / HPF 0-5 0 - 5 RBC/hpf    Comment: Please note change in reference range.   Bacteria, UA NONE SEEN NONE SEEN    Comment: Please note change in reference range.  CBG monitoring, ED Status: Abnormal   Collection Time: 06/26/15 8:40 AM  Result Value Ref Range   Glucose-Capillary 199 (H) 65 - 99 mg/dL      Current Facility-Administered Medications  Medication Dose Route Frequency Provider Last Rate Last Dose  . acetaminophen (TYLENOL) tablet 650 mg 650 mg Oral Q4H PRN Davonna Belling, MD    . FLUoxetine (PROZAC) capsule 20 mg 20 mg Oral Daily Mojeed Akintayo    . ibuprofen (ADVIL,MOTRIN) tablet 600 mg 600 mg Oral Q8H PRN Davonna Belling, MD    . insulin aspart (novoLOG) injection 0-15 Units 0-15 Units Subcutaneous TID WC Davonna Belling, MD  3 Units at 06/26/15 [phone]  . insulin aspart (novoLOG)  injection 4 Units 4 Units Subcutaneous TID WC Davonna Belling, MD  4 Units at 06/26/15 [phone]  . insulin glargine (LANTUS) injection 15 Units 15 Units Subcutaneous QHS Davonna Belling, MD  15 Units at 06/25/15 2239  . LORazepam (ATIVAN) tablet 0-4 mg 0-4 mg Oral 4 times per day Davonna Belling, MD  Stopped at 06/26/15 [phone]   Followed by  . [START ON 06/28/2015] LORazepam (ATIVAN) tablet 0-4 mg 0-4 mg Oral Q12H Davonna Belling, MD    . ondansetron [facility]) tablet 4 mg 4 mg Oral Q8H PRN Davonna Belling, MD    . thiamine (VITAMIN B-1) tablet 100 mg 100 mg Oral Daily Davonna Belling, MD  100 mg at 06/26/15 1607   Or  .  thiamine (B-1) injection 100 mg 100 mg Intravenous Daily Davonna Belling, MD     Current Outpatient Prescriptions  Medication Sig Dispense Refill  . Continuous Glucose Monitor DEVI Please provide with affordable device, alcohol swabs. Needles and strips. (Patient not taking: Reported on 06/25/2015) 1 each 0  . insulin glargine (LANTUS) 100 UNIT/ML injection 15 units in am (Patient not taking: Reported on 06/25/2015) 10 mL 11  . Insulin Pen Needle 29G X 8MM MISC Please provide insulin needles (Patient not taking: Reported on 06/25/2015) 100 each 1  . insulin regular (HUMULIN R) 100 units/mL injection Inject 0.05 mLs (5 Units total) into the skin 3 (three) times daily before meals. (Patient not taking: Reported on 06/25/2015) 10 mL 11    Musculoskeletal: Strength & Muscle Tone: within normal limits Gait & Station: normal Patient leans: N/A  Psychiatric Specialty Exam: Review of Systems  Constitutional: Negative.  HENT: Negative.  Eyes: Negative.  Respiratory: Negative.  Cardiovascular: Negative.  Gastrointestinal: Negative.  Genitourinary: Negative.  Musculoskeletal: Negative.  Skin: Negative.  Neurological: Negative.  Endo/Heme/Allergies: Negative.   Psychiatric/Behavioral: Positive for depression and substance abuse.    Blood pressure 101/63, pulse 78, temperature 98.2 F (36.8 C), temperature source Oral, resp. rate 18, SpO2 99 %.There is no weight on file to calculate BMI.  General Appearance: Casual  Eye Contact:: Good  Speech: Normal Rate  Volume: Normal  Mood: Depressed  Affect: Congruent  Thought Process: Coherent  Orientation: Full (Time, Place, and Person)  Thought Content: WDL  Suicidal Thoughts:Passive SI with no plan  Homicidal Thoughts: No  Memory: Immediate; Good Recent; Good Remote; Good  Judgement: Fair  Insight: Fair  Psychomotor Activity: Normal  Concentration: Good  Recall: Good  Fund of Knowledge:Good  Language: Good  Akathisia: No  Handed: Right  AIMS (if indicated):    Assets: Leisure Time Physical Health Resilience  ADL's: Intact  Cognition: WNL  Sleep:     Treatment Plan Summary: Daily contact with patient to assess and evaluate symptoms and progress in treatment, Medication management and Plan Alcohol induced mood disorder:  -Crisis stabilization -Medication management: Ativan 1 mg every six hours prn alcohol withdrawal symptoms -Individual and substance abuse counseling -Start blood glucose check achs with Novolog sliding scale insulin and meal coverage.  Disposition: Admit to the [place] unit for detox and medication management  Elmarie Shiley, NP-C 06/26/2015 16:23             I agree with assessment and plan Geralyn Flash A. Sabra Heck, M.D.
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
## 2015-06-26 NOTE — Plan of Care (Signed)
[facility] Observation Crisis Plan  Reason for Crisis Plan:  Crisis Stabilization and Substance Abuse   Plan of Care:  Referral for Substance Abuse  Family Support:      Current Living Environment:  Living Arrangements: Spouse/significant other  Insurance:   Hospital Account    Name Acct ID Class Status Primary Coverage   [name] Kennedy, [name] Kennedy [id number] BEHAVIORAL HEALTH OBSERVATION [facility] [place] [address]        Guarantor Account (for Hospital Account [id number])    Name Relation to Pt Service Area Active? Acct Type   [name] Kennedy, [name] Kennedy Self [facility] Yes Behavioral Health   Address Phone       [place], [place] [address] [phone](H)          Coverage Information (for Hospital Account [id number])    F/O Payor/Plan Precert #   [facility] [address]    Subscriber Subscriber #   [name] Kennedy, [name] Kennedy [id number]   Address Phone   [address] [place], [place] [address] [phone]      Legal Guardian:     Primary Care Provider:  No primary care provider on file.  [facility]  Psychiatrist:     Counselor/Therapist:     Compliant with Medications:  No  Additional Information:   [name] Kennedy, [name] Kennedy 11/21/20161:38 PM

## 2015-06-26 NOTE — [facility] Counselor (Signed)
[facility] Assessment Progress Note  Pt came into unit polite and quiet. After being awoken to consult with Fransisca KaufmannLaura Davis, NP, pt became demanding and irritable. Pt indicated that he wanted to go to a rehab program in [place]. He was not aware of any specific program, but stated that [name] a [place] of resources and counselor should be able to find him something. Pt then inquired about the [name]. Counselor gave pt information on the specifics of [name] and pt disputed everything pt said, concerning residing at [name].  Counselor advised pt that he could be provided with a list of [name] in whatever area he wanted to move to and he could call to check on vacancies. Pt then indicated he wasn't sure yet what he wanted to do.    [name] ShockSamantha M. Ladona Ridgelaylor, MS, NCC, LPCA Counselor

## 2015-06-26 NOTE — Progress Notes (Signed)
Inpatient Diabetes Program Recommendations  AACE/ADA: New Consensus Statement on Inpatient Glycemic Control (2015)  Target Ranges:  Prepandial:   less than 140 mg/dL      Peak postprandial:   less than 180 mg/dL (1-2 hours)      Critically ill patients:  140 - 180 mg/dL     Psych ED- Suicidal, Depression, Polysubstance Abuse.  History: DM  Home DM Meds: Lantus 15 units daily (Not taking insulin for several months due to cost)       Regular insulin 5 tidwc (Not taking insulin for several months due to cost)  Current Orders: Lantus 15 units QHS (to start tonight)       Novolog Moderate SSI (0-15 units) TID AC      Novolog 4 units tidwc    -[facility] b[facility]c [facility] needs insulin.[facility]  -Note Novolog started this AM.  Patient to start Lantus insulin tonight.    --Will follow patient during hospitalization--  Ambrose FinlandJeannine Johnston Zsazsa Bahena RN, MSN, CDE Diabetes Coordinator Inpatient Glycemic Control Team Team Pager: [phone] (8a-5p)

## 2015-06-26 NOTE — [facility] Counselor (Signed)
Pt accepted to Observation Unit bed 3 [facility] recinded. Pt voluntarily signed consent to treatment in observation unit. Paperwork faxed over to Assessment department. RN notified.   Kateri PlummerKristin Naliah Eddington, M.S., LPCA, [facility]LCASA, [facility] Licensed Professional Counselor Associate  Triage Specialist  [facility]  Therapeutic Triage Services Phone: [phone] Fax: [phone]

## 2015-06-26 NOTE — [facility] Counselor (Signed)
Spoke with pt. Patient appears to be in contemplation stage of change at this time. Patient expressed that he wishes to be located in the [place] area and that he does not wish to return home at this time as it is not a safe environment for recovery [as in stability is needed and home is not an environment that fosters recovery at this point ]. Patient states that he is willing to explore other options location wise, but that [name] would like to go due to resources and access to employment given his background and area of expertise work wise. Patient's areas of need/ concern involve 1) recovery/ safe environment 2) housing/ shelter  3) employment/ application for SSI and help with maintaining employment 4) financial stability  This Clinical research associatewriter will find resources for patient in [place] for long-term recovery/ residential and explore/ provide additional resources/ options for patient as well. Jala Dundon K. Sherlon HandingHarris, LCAS-A, LPC-A, [facility]  Counselor 06/26/2015 7:58 PM

## 2015-06-26 NOTE — [facility] Counselor (Signed)
[facility]26/15 [facility] [place] [facility] beds are at [facility]. [name] Kennedy, LCAS-A, LPC-A, [facility]  Counselor 06/26/2015 3:41 AM

## 2015-06-26 NOTE — [facility] Counselor (Signed)
This Clinical research associatewriter explored options for patient continued care. This Clinical research associatewriter contacted the [facility] line for resources and options for treatment. This Clinical research associatewriter was then connected to [facility] and spoke with counselor Renee [phone]. Patient options given current LME of [facility] were further explored. Recommendations under patient qualifications [[facility]]  were for [facility] or [facility]. [facility] [ see previous EPIC notes]  and [facility] was given as an potion for patient to transfer to upon d/c. [facility] [phone] [facility].  During conversation, no patient identifying information was disclosed only patient status of residency and LME entity name [[facility]] along with substances used info were mentioned.    Afterwards, options were discussed with patient, and patient signed consent and agrees to go [place] [facility] [place] with instructions and to fill out screening info for admission to detox/ 14 day tx  Program once at the location. This Clinical research associatewriter will provide print out in pt. File and instructions for d/c upon being cleared and seen in the a.m. by provider in OBS.    This Clinical research associatewriter spoke with Molly Maduroobert from [facility] to confirm that patient meets criteria and is able to be screened for [facility] at [facility]. Patient must present i.d., and on screening form list homeless as status and [name] as local area [ this will be included on pt. Instructions print out received upon d/c]. Also, for future help/ use the detox/ mobile crisis contact info will be given to patient for use and as additional help to be placed in pt. Chart for client upon d/c.   Tariya Morrissette K. Sherlon HandingHarris, LCAS-A, LPC-A, [facility]  Counselor 06/26/2015 9:28 PM

## 2015-06-26 NOTE — Progress Notes (Signed)
[facility] INPATIENT:  Family/Significant Other Suicide Prevention Education  Suicide Prevention Education:  Patient Refusal for Family/Significant Other Suicide Prevention Education: The patient [name] Kennedy has refused to provide written consent for family/significant other to be provided Family/Significant Other Suicide Prevention Education during admission and/or prior to discharge.  Physician notified.  Loren RacerMaggio, Tamma Brigandi J 06/26/2015, 1:38 PM

## 2015-06-27 DIAGNOSIS — F1024 Alcohol dependence with alcohol-induced mood disorder: Secondary | ICD-10-CM | POA: Diagnosis not present

## 2015-06-27 DIAGNOSIS — F1994 Other psychoactive substance use, unspecified with psychoactive substance-induced mood disorder: Secondary | ICD-10-CM | POA: Diagnosis not present

## 2015-06-27 LAB — GLUCOSE, CAPILLARY: Glucose-Capillary: 234 mg/dL — ABNORMAL HIGH (ref 65–99)

## 2015-06-27 MED ORDER — INSULIN ASPART 100 UNIT/ML ~~LOC~~ SOLN: 0.0000 [IU] | Freq: Three times a day (TID) | SUBCUTANEOUS | Status: DC

## 2015-06-27 MED ORDER — CONTINUOUS GLUCOSE MONITOR DEVI: Status: DC

## 2015-06-27 MED ORDER — TRAZODONE HCL 50 MG PO TABS: 50.0000 mg | ORAL_TABLET | Freq: Every evening | ORAL | Status: DC | PRN

## 2015-06-27 MED ORDER — INSULIN GLARGINE 100 UNIT/ML ~~LOC~~ SOLN: 15.0000 [IU] | Freq: Every day | SUBCUTANEOUS | Status: DC

## 2015-06-27 MED ORDER — INSULIN PEN NEEDLE 29G X 8MM MISC: Status: DC

## 2015-06-27 MED ORDER — INSULIN GLARGINE 100 UNIT/ML ~~LOC~~ SOLN
15.0000 [IU] | Freq: Every day | SUBCUTANEOUS | Status: DC
  Filled 2015-06-27: qty 0.15

## 2015-06-27 MED ORDER — INSULIN ASPART 100 UNIT/ML ~~LOC~~ SOLN: 6.0000 [IU] | Freq: Three times a day (TID) | SUBCUTANEOUS | Status: DC

## 2015-06-27 NOTE — Discharge Summary (Signed)
[facility]-Observation Unit  Discharge Summary Note  Patient:  [name] Kennedy is an 49 y.o., male MRN:  [id number] DOB:  13-Feb-1966 Patient phone:  [phone] (home)  Patient address:   [place] [address],  Total Time spent with patient: 30 minutes  Date of Admission:  06/26/2015 Date of Discharge: 06/27/2015  Reason for Admission: Substance abuse  Principal Problem: Substance induced mood disorder [facility]) Discharge Diagnoses: Patient Active Problem List   Diagnosis Date Noted  . Alcohol-induced mood disorder (HCC) [F10.94] 06/26/2015  . Substance induced mood disorder (HCC) [F19.94] 06/26/2015  . Uncomplicated alcohol dependence (HCC) [F10.20]   . Alcohol dependence (HCC) [F10.20] 01/24/2013  . Cocaine dependence (HCC) [F14.20] 01/24/2013  . Suicidal ideation [R45.851] 01/23/2013   Past Medical History:  Past Medical History  Diagnosis Date  . Suicidal ideations   . Diabetes mellitus without complication (HCC)    History reviewed. No pertinent past surgical history. Family History: History reviewed. No pertinent family history. Social History:  History  Alcohol Use  . Yes     History  Drug Use  . Yes  . Special: Cocaine    Social History   Social History  . Marital Status: Single    Spouse Name: N/A  . Number of Children: N/A  . Years of Education: N/A   Social History Main Topics  . Smoking status: Current Every Day Smoker -- 1.00 packs/day    Types: Cigarettes  . Smokeless tobacco: None  . Alcohol Use: Yes  . Drug Use: Yes    Special: Cocaine  . Sexual Activity: Not Asked   Other Topics Concern  . None   Social History Narrative    Hospital Course:     [name] Kennedy is a 49 yo male who presented to the ED via IVC from [facility] because he had substance with depression but also had diabetes that they cannot manage. History of substance abuse, cocaine and alcohol. Irritable on exam, denies suicidal/homicidal ideations, hallucinations, and  withdrawal symptoms. He is homeless as his wife "kicked" him out due to his substance abuse. Patient is especially irritable when detailed questions are asked during the statement such as specific depressive symptoms as he stated "I told you I was depressed. That is all there is. I drink alcohol everyday to deal with it. I am homeless now because my wife is so crazy. I need a 90 day program to go to when I leave here." His urine drug screen is positive for cocaine and alcohol level is less than five.   Patient was admitted to the [facility] unit for medication management and referral to a substance abuse program. Patient reported being on insulin for his Diabetes but had not taken in a year to having run out. He was restarted on Lantus 15 units at bedtime along for Novolog meal coverage while in the hospital. [name] Kennedy reported he had become more depressed due to conflict with his wife and had turned to drugs to cope with his symptoms. During his stay the patient did not display any symptoms of alcohol withdrawal and was positive for cocaine on admission. The counselor was able to refer the patient to [facility] in [place] but requested patient have a thirty day supply of insulin as the program in 120 days. He was discharged from [facility] with a thirty day supply of Lantus insulin along with a bus ticket to [place], which was approved by Dr. Lucianne Muss. Prior to his discharge the patient denied any active suicidal ideation and appeared  stable for discharge. Patient was not started on a psychotropic agent such as an antidepressant as his symptoms appeared to be substance induced from frequent use of alcohol and cocaine. They appeared from the assessment to be exacerbated by conflict with his wife who he described as having mental health concerns herself. Patient left [facility] in stable condition with all belongings returned to him.   Physical Findings: AIMS: Facial and Oral Movements Muscles of Facial  Expression: None, normal Lips and Perioral Area: None, normal Jaw: None, normal Tongue: None, normal,Extremity Movements Upper (arms, wrists, hands, fingers): None, normal Lower (legs, knees, ankles, toes): None, normal, Trunk Movements Neck, shoulders, hips: None, normal, Overall Severity Severity of abnormal movements (highest score from questions above): None, normal Incapacitation due to abnormal movements: None, normal Patient's awareness of abnormal movements (rate only patient's report): No Awareness, Dental Status Current problems with teeth and/or dentures?: No Does patient usually wear dentures?: No  CIWA:  CIWA-Ar Total: 4 COWS:  COWS Total Score: 1  Musculoskeletal: Strength & Muscle Tone: within normal limits Gait & Station: normal Patient leans: N/A  Psychiatric Specialty Exam: Review of Systems  Constitutional: Negative.   HENT: Negative.   Eyes: Negative.   Respiratory: Negative.   Cardiovascular: Negative.   Gastrointestinal: Negative.   Genitourinary: Negative.   Musculoskeletal: Negative.   Skin: Negative.   Neurological: Negative.   Endo/Heme/Allergies: Negative.   Psychiatric/Behavioral: Positive for depression (Stable ) and substance abuse. Negative for suicidal ideas, hallucinations and memory loss. The patient is not nervous/anxious and does not have insomnia.     Blood pressure 121/79, pulse 81, temperature 98 F (36.7 C), temperature source Oral, resp. rate 16, height 5' 6.5" (1.689 m), weight 70.308 kg (155 lb).Body mass index is 24.65 kg/(m^2).  General Appearance: Casual  Eye Contact::  Good  Speech:  Clear and Coherent  Volume:  Normal  Mood:  Euthymic  Affect:  Appropriate  Thought Process:  Goal Directed and Intact  Orientation:  Full (Time, Place, and Person)  Thought Content:  WDL  Suicidal Thoughts:  No  Homicidal Thoughts:  No  Memory:  Immediate;   Good Recent;   Good Remote;   Good  Judgement:  Fair  Insight:  Shallow   Psychomotor Activity:  Normal  Concentration:  Good  Recall:  Good  Fund of Knowledge:Good  Language: Good  Akathisia:  No  Handed:  Right  AIMS (if indicated):     Assets:  Communication Skills Desire for Improvement Leisure Time Resilience  ADL's:  Intact  Cognition: WNL  Sleep:  Number of Hours: 6   Have you used any form of tobacco in the last 30 days? (Cigarettes, Smokeless Tobacco, Cigars, and/or Pipes): Yes  Has this patient used any form of tobacco in the last 30 days? (Cigarettes, Smokeless Tobacco, Cigars, and/or Pipes) Yes, N/A  Metabolic Disorder Labs:  No results found for: HGBA1C, MPG No results found for: PROLACTIN No results found for: CHOL, TRIG, HDL, CHOLHDL, VLDL, LDLCALC  Discharge destination:  Other:  [facility] in [place]  Is patient on multiple antipsychotic therapies at discharge:  No   Has Patient had three or more failed trials of antipsychotic monotherapy by history:  No  Recommended Plan for Multiple Antipsychotic Therapies: NA      Discharge Instructions    Discharge instructions    Complete by:  As directed   Please follow up in [facility] [place] location as discussed with Dr. Lucianne MussKumar regarding remaining on your Insulin Lantus  for diabetic management before entering treatment for substance abuse problems. As previously discussed a supply of insulin for thirty days will be provided prior to your discharge.            Medication List    STOP taking these medications        insulin regular 100 units/mL injection  Commonly known as:  HUMULIN R      TAKE these medications      Indication   Continuous Glucose Monitor Devi  Please provide with affordable device, alcohol swabs. Needles and strips.   Indication:  For diabetic care     insulin glargine 100 UNIT/ML injection  Commonly known as:  LANTUS  Inject 0.15 mLs (15 Units total) into the skin at bedtime.   Indication:  Type 2 Diabetes     Insulin Pen Needle 29G X  Misc  Please provide insulin needles   Indication:  For diabetic care     traZODone 50 MG tablet  Commonly known as:  DESYREL  Take 1 tablet (50 mg total) by mouth at bedtime as needed for sleep.   Indication:  Trouble Sleeping       Follow-up Information    Go to [facility] .   Why:  TO BECOME AN ESTABLISHED PATIENT   Contact information:   [address] [place], [place] [address] [phone]      Follow up with [facility]. Go on 06/28/2015.   Why:  FOR ENTRANCE INTO THEIR PROGRAM   Contact information:   [address] [place], [place] [address] [phone]      Follow-up recommendations:   As above  Comments:   Take all your medications as prescribed by your mental healthcare provider.  Report any adverse effects and or reactions from your medicines to your outpatient provider promptly.  Patient is instructed and cautioned to not engage in alcohol and or illegal drug use while on prescription medicines.  In the event of worsening symptoms, patient is instructed to call the crisis hotline, 911 and or go to the nearest ED for appropriate evaluation and treatment of symptoms.  Follow-up with your primary care provider for your other medical issues, concerns and or health care needs.   SignedFransisca Kaufmann, NP-C 06/27/2015, 1:08 PM

## 2015-06-27 NOTE — Progress Notes (Signed)
Patient discharged per MD orders. Patient given education regarding follow-up appointments and medications. Patient denies any questions or concerns about these instructions. Patient was escorted to locker and given belongings before discharge to hospital lobby. Patient currently denies SI/HI and auditory and visual hallucinations on discharge. 

## 2015-06-27 NOTE — Progress Notes (Signed)
Inpatient Diabetes Program Recommendations  AACE/ADA: New Consensus Statement on Inpatient Glycemic Control (2015)  Target Ranges:  Prepandial:   less than 140 mg/dL      Peak postprandial:   less than 180 mg/dL (1-2 hours)      Critically ill patients:  140 - 180 mg/dL   Results for [name], Kaesyn (MRN [id number]) as of 06/27/2015 09:58  Ref. Range 06/26/2015 08:40 06/26/2015 12:05 06/26/2015 17:00 06/26/2015 20:51  Glucose-Capillary Latest Ref Range: 65-99 mg/dL 409199 (H) 811146 (H) 914221 (H) 176 (H)    Results for [name], Wallace (MRN [id number]) as of 06/27/2015 09:58  Ref. Range 06/27/2015 05:52  Glucose-Capillary Latest Ref Range: 65-99 mg/dL 086234 (H)    Suicidal, Depression, Polysubstance Abuse.  History: DM  Home DM Meds: Lantus 15 units daily (Not taking insulin for several months due to cost)  Regular insulin 5 tidwc (Not taking insulin for several months due to cost)  Current Orders: Lantus 15 units QHS (to start tonight)   Novolog Moderate SSI (0-15 units) TID AC  Novolog 6 units tidwc    -[facility]  -Lantus was not given last PM- As a result, patient's AM CBG today was elevated to 234 mg/dl.  Note patient should receive Lantus tonight.  Agree.    --Will follow patient during hospitalization--  Ambrose FinlandJeannine Johnston Teodora Baumgarten RN, MSN, CDE Diabetes Coordinator Inpatient Glycemic Control Team Team Pager: [phone] (8a-5p)

## 2015-06-27 NOTE — [facility] Counselor (Signed)
[facility] Assessment Progress Note  Pt referral information faxed to [facility] [phone]), Birdena Jubileeattn [facility] for pt's entrance into the program. Pt completed a phone screening assessment with [facility] [phone]). [facility] requested that pt receive a 30 day supply of his insulin and get "affiliated" with [facility] so that he can continue to receive his insulin while in the program, which is 120 days.  Pt d/c with 30 day supply of insulin and a bus ticket to [address] [place] (approved by Dr. Lucianne MussKumar). He will [facility] [place] to get affiliated with them thru the walk-in process and then he will go to [facility] for intake. [facility] is aware of the plan and agrees to accommodate pt as much as he is able to.    Johny ShockSamantha M. Ladona Ridgelaylor, MS, NCC, LPCA Counselor

## 2015-10-19 ENCOUNTER — Observation Stay (HOSPITAL_COMMUNITY)
Admission: AD | Admit: 2015-10-19 | Discharge: 2015-10-21 | Disposition: A | Discharge status: Home / Self Care | Payer: Federal, State, Local not specified - Other | Source: Intra-hospital | Attending: Psychiatry | Admitting: Psychiatry

## 2015-10-19 ENCOUNTER — Emergency Department (HOSPITAL_COMMUNITY)
Admission: EM | Admit: 2015-10-19 | Discharge: 2015-10-19 | Disposition: A | Discharge status: Other Acute Inpatient Hospital | Payer: Federal, State, Local not specified - Other | Attending: Emergency Medicine | Admitting: Emergency Medicine

## 2015-10-19 ENCOUNTER — Encounter (HOSPITAL_COMMUNITY): Payer: Self-pay

## 2015-10-19 ENCOUNTER — Encounter (HOSPITAL_COMMUNITY): Payer: Self-pay | Admitting: *Deleted

## 2015-10-19 DIAGNOSIS — R45850 Homicidal ideations: Secondary | ICD-10-CM | POA: Insufficient documentation

## 2015-10-19 DIAGNOSIS — F1994 Other psychoactive substance use, unspecified with psychoactive substance-induced mood disorder: Secondary | ICD-10-CM | POA: Diagnosis present

## 2015-10-19 DIAGNOSIS — R45851 Suicidal ideations: Secondary | ICD-10-CM

## 2015-10-19 DIAGNOSIS — F1420 Cocaine dependence, uncomplicated: Secondary | ICD-10-CM | POA: Insufficient documentation

## 2015-10-19 DIAGNOSIS — F329 Major depressive disorder, single episode, unspecified: Secondary | ICD-10-CM | POA: Insufficient documentation

## 2015-10-19 DIAGNOSIS — F17210 Nicotine dependence, cigarettes, uncomplicated: Secondary | ICD-10-CM | POA: Insufficient documentation

## 2015-10-19 DIAGNOSIS — G4700 Insomnia, unspecified: Secondary | ICD-10-CM | POA: Insufficient documentation

## 2015-10-19 DIAGNOSIS — F419 Anxiety disorder, unspecified: Secondary | ICD-10-CM | POA: Insufficient documentation

## 2015-10-19 DIAGNOSIS — E119 Type 2 diabetes mellitus without complications: Secondary | ICD-10-CM | POA: Insufficient documentation

## 2015-10-19 DIAGNOSIS — F1024 Alcohol dependence with alcohol-induced mood disorder: Secondary | ICD-10-CM | POA: Diagnosis not present

## 2015-10-19 DIAGNOSIS — F1410 Cocaine abuse, uncomplicated: Secondary | ICD-10-CM | POA: Insufficient documentation

## 2015-10-19 DIAGNOSIS — F1429 Cocaine dependence with unspecified cocaine-induced disorder: Secondary | ICD-10-CM | POA: Diagnosis not present

## 2015-10-19 DIAGNOSIS — F1020 Alcohol dependence, uncomplicated: Secondary | ICD-10-CM | POA: Diagnosis present

## 2015-10-19 DIAGNOSIS — Z590 Homelessness: Secondary | ICD-10-CM | POA: Insufficient documentation

## 2015-10-19 DIAGNOSIS — R440 Auditory hallucinations: Secondary | ICD-10-CM | POA: Insufficient documentation

## 2015-10-19 LAB — CBC WITH DIFFERENTIAL/PLATELET
BASOS ABS: 0.1 10*3/uL (ref 0.0–0.1)
Basophils Relative: 1 %
EOS ABS: 0 10*3/uL (ref 0.0–0.7)
Eosinophils Relative: 0 %
HCT: 40.3 % (ref 39.0–52.0)
HEMOGLOBIN: 13 g/dL (ref 13.0–17.0)
LYMPHS PCT: 17 %
Lymphs Abs: 1.6 10*3/uL (ref 0.7–4.0)
MCH: 27.7 pg (ref 26.0–34.0)
MCHC: 32.3 g/dL (ref 30.0–36.0)
MCV: 85.9 fL (ref 78.0–100.0)
Monocytes Absolute: 0.5 10*3/uL (ref 0.1–1.0)
Monocytes Relative: 5 %
NEUTROS ABS: 7.5 10*3/uL (ref 1.7–7.7)
NEUTROS PCT: 77 %
Platelets: 242 10*3/uL (ref 150–400)
RBC: 4.69 MIL/uL (ref 4.22–5.81)
RDW: 13.1 % (ref 11.5–15.5)
WBC: 9.7 10*3/uL (ref 4.0–10.5)

## 2015-10-19 LAB — COMPREHENSIVE METABOLIC PANEL
ALT: 16 U/L — ABNORMAL LOW (ref 17–63)
ANION GAP: 12 (ref 5–15)
AST: 21 U/L (ref 15–41)
Albumin: 2.9 g/dL — ABNORMAL LOW (ref 3.5–5.0)
Alkaline Phosphatase: 36 U/L — ABNORMAL LOW (ref 38–126)
BILIRUBIN TOTAL: 0.3 mg/dL (ref 0.3–1.2)
BUN: 6 mg/dL (ref 6–20)
CO2: 23 mmol/L (ref 22–32)
Calcium: 8.6 mg/dL — ABNORMAL LOW (ref 8.9–10.3)
Chloride: 108 mmol/L (ref 101–111)
Creatinine, Ser: 0.81 mg/dL (ref 0.61–1.24)
Glucose, Bld: 139 mg/dL — ABNORMAL HIGH (ref 65–99)
POTASSIUM: 3.5 mmol/L (ref 3.5–5.1)
Sodium: 143 mmol/L (ref 135–145)
TOTAL PROTEIN: 5.7 g/dL — AB (ref 6.5–8.1)

## 2015-10-19 LAB — SALICYLATE LEVEL

## 2015-10-19 LAB — RAPID URINE DRUG SCREEN, HOSP PERFORMED
Amphetamines: NOT DETECTED
BARBITURATES: NOT DETECTED
BENZODIAZEPINES: NOT DETECTED
COCAINE: POSITIVE — AB
Opiates: NOT DETECTED
TETRAHYDROCANNABINOL: NOT DETECTED

## 2015-10-19 LAB — CBG MONITORING, ED
Glucose-Capillary: 135 mg/dL — ABNORMAL HIGH (ref 65–99)
Glucose-Capillary: 80 mg/dL (ref 65–99)

## 2015-10-19 LAB — GLUCOSE, CAPILLARY
GLUCOSE-CAPILLARY: 147 mg/dL — AB (ref 65–99)
Glucose-Capillary: 98 mg/dL (ref 65–99)

## 2015-10-19 LAB — ETHANOL: ALCOHOL ETHYL (B): 98 mg/dL — AB (ref <5)

## 2015-10-19 LAB — ACETAMINOPHEN LEVEL

## 2015-10-19 MED ORDER — HYDROXYZINE HCL 25 MG PO TABS
25.0000 mg | ORAL_TABLET | Freq: Four times a day (QID) | ORAL | Status: DC | PRN
  Administered 2015-10-19: 25 mg via ORAL
  Filled 2015-10-19: qty 1

## 2015-10-19 MED ORDER — ACETAMINOPHEN 325 MG PO TABS: 650.0000 mg | ORAL_TABLET | ORAL | Status: DC | PRN

## 2015-10-19 MED ORDER — LOPERAMIDE HCL 2 MG PO CAPS: 2.0000 mg | ORAL_CAPSULE | ORAL | Status: DC | PRN

## 2015-10-19 MED ORDER — ONDANSETRON HCL 4 MG PO TABS: 4.0000 mg | ORAL_TABLET | Freq: Three times a day (TID) | ORAL | Status: DC | PRN

## 2015-10-19 MED ORDER — ALUM & MAG HYDROXIDE-SIMETH 200-200-20 MG/5ML PO SUSP: 30.0000 mL | ORAL | Status: DC | PRN

## 2015-10-19 MED ORDER — ADULT MULTIVITAMIN W/MINERALS CH
1.0000 | ORAL_TABLET | Freq: Every day | ORAL | Status: DC
  Administered 2015-10-20 – 2015-10-21 (×2): 1 via ORAL
  Filled 2015-10-19 (×2): qty 1

## 2015-10-19 MED ORDER — ACETAMINOPHEN 325 MG PO TABS: 650.0000 mg | ORAL_TABLET | Freq: Four times a day (QID) | ORAL | Status: DC | PRN

## 2015-10-19 MED ORDER — LORAZEPAM 1 MG PO TABS: 1.0000 mg | ORAL_TABLET | Freq: Three times a day (TID) | ORAL | Status: DC | PRN

## 2015-10-19 MED ORDER — THIAMINE HCL 100 MG/ML IJ SOLN: 100.0000 mg | Freq: Once | INTRAMUSCULAR | Status: DC

## 2015-10-19 MED ORDER — INSULIN ASPART 100 UNIT/ML ~~LOC~~ SOLN
0.0000 [IU] | Freq: Three times a day (TID) | SUBCUTANEOUS | Status: DC
  Administered 2015-10-20: 2 [IU] via SUBCUTANEOUS

## 2015-10-19 MED ORDER — VITAMIN B-1 100 MG PO TABS
100.0000 mg | ORAL_TABLET | Freq: Every day | ORAL | Status: DC
Start: 2015-10-20 — End: 2015-10-21
  Administered 2015-10-20 – 2015-10-21 (×2): 100 mg via ORAL
  Filled 2015-10-19 (×2): qty 1

## 2015-10-19 MED ORDER — MAGNESIUM HYDROXIDE 400 MG/5ML PO SUSP: 30.0000 mL | Freq: Every day | ORAL | Status: DC | PRN

## 2015-10-19 MED ORDER — GUAIFENESIN-DM 100-10 MG/5ML PO SYRP
10.0000 mL | ORAL_SOLUTION | Freq: Four times a day (QID) | ORAL | Status: DC | PRN
  Administered 2015-10-19 – 2015-10-21 (×2): 10 mL via ORAL
  Filled 2015-10-19 (×2): qty 10

## 2015-10-19 MED ORDER — IBUPROFEN 400 MG PO TABS: 600.0000 mg | ORAL_TABLET | Freq: Three times a day (TID) | ORAL | Status: DC | PRN

## 2015-10-19 MED ORDER — ZOLPIDEM TARTRATE 5 MG PO TABS: 5.0000 mg | ORAL_TABLET | Freq: Every evening | ORAL | Status: DC | PRN

## 2015-10-19 MED ORDER — NICOTINE 21 MG/24HR TD PT24: 21.0000 mg | MEDICATED_PATCH | Freq: Every day | TRANSDERMAL | Status: DC

## 2015-10-19 MED ORDER — ONDANSETRON 4 MG PO TBDP
4.0000 mg | ORAL_TABLET | Freq: Four times a day (QID) | ORAL | Status: DC | PRN
Start: 2015-10-19 — End: 2015-10-21

## 2015-10-19 MED ORDER — LORAZEPAM 1 MG PO TABS: 1.0000 mg | ORAL_TABLET | Freq: Four times a day (QID) | ORAL | Status: DC | PRN

## 2015-10-19 MED ORDER — INSULIN ASPART 100 UNIT/ML ~~LOC~~ SOLN
0.0000 [IU] | SUBCUTANEOUS | Status: DC
  Administered 2015-10-19: 2 [IU] via SUBCUTANEOUS
  Filled 2015-10-19: qty 1

## 2015-10-19 NOTE — ED Notes (Signed)
TTS placed at bedside, [facility].
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
## 2015-10-19 NOTE — ED Notes (Signed)
Pt CBG is 135. Nurse notified.

## 2015-10-19 NOTE — [facility] Counselor (Signed)
[facility] Assessment Progress Note  Counselor spoke several times with Denny PeonErin, RN, concerning the tele-assess machine being set up for pt. A signal could not be found in pt's room for the machine to work. Denny Peonrin indicated that she called IT and agreed to d/c the TTS consult until something could be figured out.   Johny ShockSamantha M. Ladona Ridgelaylor, MS, NCC, LPCA Counselor

## 2015-10-19 NOTE — ED Notes (Signed)
Patient was given a cup of ice water. A regular diet ordered for lunch.

## 2015-10-19 NOTE — ED Notes (Signed)
Staffing office notified for pt.'s sitter . 

## 2015-10-19 NOTE — ED Notes (Signed)
Attempted TTS consult.  Adult machine and peds machine both not working.  IT called and ticket placed for repair.  TTS consult d/c until machines are fixed per Assessment team request.
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
## 2015-10-19 NOTE — Progress Notes (Signed)
[facility] Dr Lucianne MussKumar. Bed #5, report can be called at [phone]. Pt can arrive anytime.  Ilean SkillMeghan Bexley Mclester, MSW, LCSW Clinical Social Work, Disposition  10/19/2015 [phone]

## 2015-10-19 NOTE — ED Provider Notes (Signed)
CSN: [id number]     Arrival date & time 10/19/15  4098 History   First MD Initiated Contact with Patient 10/19/15 0601     Chief Complaint  Patient presents with  . Suicidal     (Consider location/radiation/quality/duration/timing/severity/associated sxs/prior Treatment) HPI  Blood pressure 140/80, pulse 94, temperature 98.5 F (36.9 C), temperature source Oral, resp. rate 19, height  (1.676 m), weight 77.111 kg, SpO2 96 %.  [name] Kennedy is a 50 y.o. male complaining of depression and suicidal ideation. Patient states he is homeless, he lives with a woman who is mean to him, she makes light of him causing stupid and says he can't read. He feels very dejected by this. He has auditory hallucinations and he cannot describe what exactly they say this is they are derogatory, doesn't appear that they're command hallucinations. He has a history of prior suicide attempt he states he tried to hang himself once. He has no plan at this point. He has had vague homicidal ideations on the woman he lives with, no plan. He smokes crack cocaine and drinks alcohol occasionally. He is not under any psychiatric care. He has a history of diabetes and he states that he got insulin while he was in jail but he is not treated at this point.  Past Medical History  Diagnosis Date  . Suicidal ideations   . Diabetes mellitus without complication ([facility])    History reviewed. No pertinent past surgical history. No family history on file. Social History  Substance Use Topics  . Smoking status: Current Every Day Smoker -- 1.00 packs/day    Types: Cigarettes  . Smokeless tobacco: None  . Alcohol Use: Yes    Review of Systems  10 systems reviewed and found to be negative, except as noted in the HPI.   Allergies  Review of patient's allergies indicates no known allergies.  Home Medications   Prior to Admission medications   Not on File   BP 140/80 mmHg  Pulse 94  Temp(Src) 98.5 F (36.9 C) (Oral)   Resp 19  Ht  (1.676 m)  Wt 77.111 kg  BMI 27.45 kg/m2  SpO2 96% Physical Exam  Constitutional: He is oriented to person, place, and time. He appears well-developed and well-nourished. No distress.  HENT:  Head: Normocephalic and atraumatic.  Mouth/Throat: Oropharynx is clear and moist.  Eyes: Conjunctivae and EOM are normal. Pupils are equal, round, and reactive to light.  Neck: Normal range of motion.  Cardiovascular: Normal rate, regular rhythm and intact distal pulses.   Pulmonary/Chest: Effort normal and breath sounds normal.  Abdominal: Soft. There is no tenderness.  Musculoskeletal: Normal range of motion.  Neurological: He is alert and oriented to person, place, and time.  Skin: He is not diaphoretic.  Psychiatric: His speech is normal and behavior is normal. He exhibits a depressed mood. He expresses homicidal and suicidal ideation. He expresses no suicidal plans and no homicidal plans.  Nursing note and vitals reviewed.   ED Course  Procedures (including critical care time) Labs Review Labs Reviewed  COMPREHENSIVE METABOLIC PANEL - Abnormal; Notable for the following:    Glucose, Bld 139 (*)    Calcium 8.6 (*)    Total Protein 5.7 (*)    Albumin 2.9 (*)    ALT 16 (*)    Alkaline Phosphatase 36 (*)    All other components within normal limits  ETHANOL - Abnormal; Notable for the following:    Alcohol, Ethyl (B) 98 (*)  All other components within normal limits  ACETAMINOPHEN LEVEL - Abnormal; Notable for the following:    Acetaminophen (Tylenol), Serum <10 (*)    All other components within normal limits  URINE RAPID DRUG SCREEN, HOSP PERFORMED - Abnormal; Notable for the following:    Cocaine POSITIVE (*)    All other components within normal limits  CBG MONITORING, ED - Abnormal; Notable for the following:    Glucose-Capillary 135 (*)    All other components within normal limits  CBC WITH DIFFERENTIAL/PLATELET  SALICYLATE LEVEL  CBG MONITORING, ED     Imaging Review No results found. I have personally reviewed and evaluated these images and lab results as part of my medical decision-making.   EKG Interpretation None      MDM   Final diagnoses:  Passive suicidal ideations    Filed Vitals:   10/19/15 0532 10/19/15 0538  BP: 140/80   Pulse: 94   Temp: 98.5 F (36.9 C)   TempSrc: Oral   Resp: 19   Height: 5\' 6"  (1.676 m) 5\' 6"  (1.676 m)  Weight: 77.111 kg 77.111 kg  SpO2: 96%     Medications  alum & mag hydroxide-simeth (MAALOX/MYLANTA) 200-200-20 MG/5ML suspension 30 mL (not administered)  ondansetron (ZOFRAN) tablet 4 mg (not administered)  zolpidem (AMBIEN) tablet 5 mg (not administered)  nicotine (NICODERM CQ - dosed in mg/24 hours) patch 21 mg (21 mg Transdermal Refused 10/19/15 0942)  ibuprofen (ADVIL,MOTRIN) tablet 600 mg (not administered)  acetaminophen (TYLENOL) tablet 650 mg (not administered)  LORazepam (ATIVAN) tablet 1 mg (not administered)  insulin aspart (novoLOG) injection 0-15 Units (0 Units Subcutaneous Hold 10/19/15 1213)    [name] Kennedy is 50 y.o. male presenting with Vague suicidal ideation, patient is frustrated and he is also having noncommand auditory hallucinations. Patient is oriented 3, does not appear to be responding to internal stimuli.  Patient is medically cleared for psychiatric evaluation will be transferred to the psych ED. TTS consulted, home meds and psych standard holding orders placed.     Wynetta Emeryicole Tansy Lorek, PA-C 10/19/15 1238  Gwyneth SproutWhitney Plunkett, MD 10/19/15 1552
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
## 2015-10-19 NOTE — ED Notes (Signed)
[facility] transportation called. 

## 2015-10-19 NOTE — ED Notes (Signed)
Pt asked to be a xxx patient.  Registration notified, new bracelette placed on pt.

## 2015-10-19 NOTE — ED Notes (Signed)
Pt comes today c/o of SI/HI/ and auditory hallucinations. Pt states that he has been using ETOH and cocaine today and SI thoughts have gotten worse today, no plan.

## 2015-10-19 NOTE — Progress Notes (Signed)
D:Patient in bed awake.  Patient appears flat and depressed.  Patient does not engage in conversation with Clinical research associatewriter.  Patient denies SI.  Patient states he has HI towards his significant other although he states he has no plan to hurt her.  Patient states he hears voices that put him down.  Patient verbally contracts for safety. A: Staff to monitor Q 15 mins for safety.  Encouragement and support offered.  No Scheduled medications administered per orders.  Robitussin DM administered prn.  Vistaril administered prn for anxiety. R: Patient remains safe on the unit.

## 2015-10-20 LAB — GLUCOSE, CAPILLARY
GLUCOSE-CAPILLARY: 170 mg/dL — AB (ref 65–99)
Glucose-Capillary: 117 mg/dL — ABNORMAL HIGH (ref 65–99)
Glucose-Capillary: 132 mg/dL — ABNORMAL HIGH (ref 65–99)
Glucose-Capillary: 219 mg/dL — ABNORMAL HIGH (ref 65–99)

## 2015-10-20 NOTE — Discharge Instructions (Signed)
Patient will be discharged this date and [facility]. Patient will also be referred to [facility] to become involved in SAIOP and possible medication management.

## 2015-10-20 NOTE — Progress Notes (Signed)
Patient ID: [name] Kennedy, male   DOB: 04/25/1966, 50 y.o.   MRN: [id number] On awakening this am, his affect looked sad. When asked he denies, but with questioning states he has no where to go, no money and is unsure he wants to continue the relationship with his now girlfriend since she talks down to him and hurts his feelings. Offers little. Flat to sad affect. Returned to sleep after Clinical research associatewriter gave him his am meds.

## 2015-10-20 NOTE — BH Assessment (Signed)
[facility] Assessment Progress Note  Patient was seen by Lucianne MussKumar MD, [place] MSM, Mckade Gurka LCAS. Patient will continue in the Observation Unit and will be re-evaluated in the a.m. to determine disposition.
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
## 2015-10-20 NOTE — Progress Notes (Signed)
Patient ID: [name] Kennedy, male   DOB: 02/12/1966, 50 y.o.   MRN: [id number] Seen earlier by Dr Lucianne MussKumar and decision made for him to remain in OBS another day due to his continued thoughts to hurt self and wife,and because he has no where to stay and by the time [facility] Dr [facility] and this was an important component of his discharge plan. [facility] assists with housing and treatment which he would like. He states he can not go back home to his wife, and knows if and when he goes home to get his personal effects a conflict will happen. He was advised when he goes to his home for his things once discharged to have police be present. Dinner time CBG 132 which requires coverage of 1 unit of insulin.He refused the insulin, saying its only one unit. He does not manage his blood sugar at home, he says because he never has the medicine or supplies due to his finances. He states he is not on Medicad or Medicare. Flat affect. Offers little can be irritable. Good appetite.

## 2015-10-21 DIAGNOSIS — F1994 Other psychoactive substance use, unspecified with psychoactive substance-induced mood disorder: Secondary | ICD-10-CM | POA: Diagnosis not present

## 2015-10-21 LAB — GLUCOSE, CAPILLARY: GLUCOSE-CAPILLARY: 185 mg/dL — AB (ref 65–99)

## 2015-10-21 MED ORDER — HYDROXYZINE HCL 25 MG PO TABS: 25.0000 mg | ORAL_TABLET | Freq: Three times a day (TID) | ORAL | Status: DC | PRN

## 2015-10-21 NOTE — BH Assessment (Signed)
[facility] Assessment Progress Note Patient was discharged this date and will follow up with [facility] to address housing issues. This Clinical research associatewriter assisted patient with initial paperwork  [facility] since patient states he has problems reading/writing. This Clinical research associatewriter also instructed patient what resources in the area were available that could assist patient providing remedial classes in literacy. Patient was provided with information to access some Vocational Rehabilitation and assisted patient with initial paperwork and provided outpatient resources. Patient was encouraged to follow up with [facility] to address SA issues and possible medication management. Patient also reported problems with his marriage and was given information to contact [facility] for counseling. Patient was educated on the importance of medication management and started a relapse prevention plan prior to discharge.

## 2015-10-21 NOTE — Progress Notes (Signed)
Patient was discharged to lobby per order. AVS given, reviewed, and signed for. Script given, reviewed, and signed for. Belongings returned and signed for. Patient verbalized understanding of all, including discharge instructions, and appeared in no acute distress when escorted to lobby with bus passes.

## 2015-10-21 NOTE — Progress Notes (Signed)
Patient seen lying down with eyes open at the time of shift change. Patient appear sad and depressed. Presents with elective mutism as he refuses to respond or say anything to the Clinical research associatewriter. Patient only talks to Retail bankerthe writer when he wants to request for snacks or TV channel. CBG bedtime was 219 mg/dl. A: Patient encouraged to express needs and concerns to staff. Offered support as needed. Safety maintained. Will continue to monitor patient for safety and stability.
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
## 2015-10-23 ENCOUNTER — Inpatient Hospital Stay (HOSPITAL_COMMUNITY): Admit: 2015-10-23 | Payer: Self-pay

## 2015-10-23 ENCOUNTER — Encounter (HOSPITAL_COMMUNITY): Payer: Self-pay

## 2015-10-23 ENCOUNTER — Emergency Department (HOSPITAL_COMMUNITY)
Admission: EM | Admit: 2015-10-23 | Discharge: 2015-10-23 | Disposition: A | Discharge status: Other Acute Inpatient Hospital | Payer: Self-pay | Attending: Emergency Medicine | Admitting: Emergency Medicine

## 2015-10-23 ENCOUNTER — Inpatient Hospital Stay (HOSPITAL_COMMUNITY)
Admission: AD | Admit: 2015-10-23 | Discharge: 2015-11-01 | DRG: 885 | Disposition: A | Discharge status: Home / Self Care | Payer: Federal, State, Local not specified - Other | Source: Intra-hospital | Attending: Psychiatry | Admitting: Psychiatry

## 2015-10-23 ENCOUNTER — Encounter (HOSPITAL_COMMUNITY): Payer: Self-pay | Admitting: Emergency Medicine

## 2015-10-23 DIAGNOSIS — F329 Major depressive disorder, single episode, unspecified: Secondary | ICD-10-CM | POA: Insufficient documentation

## 2015-10-23 DIAGNOSIS — F1420 Cocaine dependence, uncomplicated: Secondary | ICD-10-CM | POA: Diagnosis present

## 2015-10-23 DIAGNOSIS — Z590 Homelessness: Secondary | ICD-10-CM | POA: Insufficient documentation

## 2015-10-23 DIAGNOSIS — F17210 Nicotine dependence, cigarettes, uncomplicated: Secondary | ICD-10-CM | POA: Insufficient documentation

## 2015-10-23 DIAGNOSIS — F333 Major depressive disorder, recurrent, severe with psychotic symptoms: Principal | ICD-10-CM | POA: Diagnosis present

## 2015-10-23 DIAGNOSIS — F1020 Alcohol dependence, uncomplicated: Secondary | ICD-10-CM | POA: Diagnosis present

## 2015-10-23 DIAGNOSIS — Z794 Long term (current) use of insulin: Secondary | ICD-10-CM

## 2015-10-23 DIAGNOSIS — Z811 Family history of alcohol abuse and dependence: Secondary | ICD-10-CM

## 2015-10-23 DIAGNOSIS — F1024 Alcohol dependence with alcohol-induced mood disorder: Secondary | ICD-10-CM | POA: Insufficient documentation

## 2015-10-23 DIAGNOSIS — R45851 Suicidal ideations: Secondary | ICD-10-CM | POA: Diagnosis present

## 2015-10-23 DIAGNOSIS — F1094 Alcohol use, unspecified with alcohol-induced mood disorder: Secondary | ICD-10-CM | POA: Diagnosis present

## 2015-10-23 DIAGNOSIS — E119 Type 2 diabetes mellitus without complications: Secondary | ICD-10-CM | POA: Insufficient documentation

## 2015-10-23 DIAGNOSIS — F10230 Alcohol dependence with withdrawal, uncomplicated: Secondary | ICD-10-CM | POA: Insufficient documentation

## 2015-10-23 DIAGNOSIS — F4310 Post-traumatic stress disorder, unspecified: Secondary | ICD-10-CM | POA: Diagnosis present

## 2015-10-23 DIAGNOSIS — F1410 Cocaine abuse, uncomplicated: Secondary | ICD-10-CM | POA: Insufficient documentation

## 2015-10-23 DIAGNOSIS — G4700 Insomnia, unspecified: Secondary | ICD-10-CM | POA: Insufficient documentation

## 2015-10-23 HISTORY — DX: Alcohol abuse, uncomplicated: F10.10

## 2015-10-23 LAB — CBG MONITORING, ED
GLUCOSE-CAPILLARY: 182 mg/dL — AB (ref 65–99)
GLUCOSE-CAPILLARY: 156 mg/dL — AB (ref 65–99)

## 2015-10-23 LAB — COMPREHENSIVE METABOLIC PANEL
ALBUMIN: 3.6 g/dL (ref 3.5–5.0)
ALK PHOS: 40 U/L (ref 38–126)
ALT: 21 U/L (ref 17–63)
ANION GAP: 13 (ref 5–15)
AST: 22 U/L (ref 15–41)
BUN: 7 mg/dL (ref 6–20)
CALCIUM: 9 mg/dL (ref 8.9–10.3)
CO2: 21 mmol/L — AB (ref 22–32)
Chloride: 103 mmol/L (ref 101–111)
Creatinine, Ser: 0.84 mg/dL (ref 0.61–1.24)
GFR calc non Af Amer: >60 mL/min (ref >60)
Glucose, Bld: 163 mg/dL — ABNORMAL HIGH (ref 65–99)
POTASSIUM: 4.2 mmol/L (ref 3.5–5.1)
SODIUM: 137 mmol/L (ref 135–145)
TOTAL PROTEIN: 6.8 g/dL (ref 6.5–8.1)
Total Bilirubin: 0.5 mg/dL (ref 0.3–1.2)

## 2015-10-23 LAB — CBC
HCT: 48 % (ref 39.0–52.0)
Hemoglobin: 15.9 g/dL (ref 13.0–17.0)
MCH: 28.8 pg (ref 26.0–34.0)
MCHC: 33.1 g/dL (ref 30.0–36.0)
MCV: 86.8 fL (ref 78.0–100.0)
Platelets: 310 10*3/uL (ref 150–400)
RBC: 5.53 MIL/uL (ref 4.22–5.81)
RDW: 13.1 % (ref 11.5–15.5)
WBC: 9 10*3/uL (ref 4.0–10.5)

## 2015-10-23 LAB — ETHANOL: Alcohol, Ethyl (B): 31 mg/dL — ABNORMAL HIGH (ref <5)

## 2015-10-23 LAB — SALICYLATE LEVEL

## 2015-10-23 LAB — GLUCOSE, CAPILLARY: GLUCOSE-CAPILLARY: 175 mg/dL — AB (ref 65–99)

## 2015-10-23 LAB — ACETAMINOPHEN LEVEL

## 2015-10-23 MED ORDER — LORAZEPAM 1 MG PO TABS: 0.0000 mg | ORAL_TABLET | Freq: Two times a day (BID) | ORAL | Status: DC

## 2015-10-23 MED ORDER — INSULIN ASPART 100 UNIT/ML ~~LOC~~ SOLN: 0.0000 [IU] | Freq: Every day | SUBCUTANEOUS | Status: DC

## 2015-10-23 MED ORDER — INSULIN ASPART 100 UNIT/ML ~~LOC~~ SOLN
0.0000 [IU] | Freq: Every day | SUBCUTANEOUS | Status: DC
Start: 2015-10-23 — End: 2015-11-01
  Administered 2015-10-24: 3 [IU] via SUBCUTANEOUS
  Administered 2015-10-29: 4 [IU] via SUBCUTANEOUS
  Administered 2015-10-31: 2 [IU] via SUBCUTANEOUS

## 2015-10-23 MED ORDER — ALUM & MAG HYDROXIDE-SIMETH 200-200-20 MG/5ML PO SUSP: 30.0000 mL | ORAL | Status: DC | PRN

## 2015-10-23 MED ORDER — ACETAMINOPHEN 325 MG PO TABS: 650.0000 mg | ORAL_TABLET | Freq: Four times a day (QID) | ORAL | Status: DC | PRN

## 2015-10-23 MED ORDER — LORAZEPAM 1 MG PO TABS: 0.0000 mg | ORAL_TABLET | Freq: Four times a day (QID) | ORAL | Status: DC

## 2015-10-23 MED ORDER — ONDANSETRON HCL 4 MG PO TABS: 4.0000 mg | ORAL_TABLET | Freq: Three times a day (TID) | ORAL | Status: DC | PRN

## 2015-10-23 MED ORDER — INSULIN GLARGINE 100 UNIT/ML ~~LOC~~ SOLN
15.0000 [IU] | Freq: Every day | SUBCUTANEOUS | Status: DC
  Filled 2015-10-23: qty 0.15

## 2015-10-23 MED ORDER — ZOLPIDEM TARTRATE 5 MG PO TABS: 5.0000 mg | ORAL_TABLET | Freq: Every evening | ORAL | Status: DC | PRN

## 2015-10-23 MED ORDER — MAGNESIUM HYDROXIDE 400 MG/5ML PO SUSP: 30.0000 mL | Freq: Every day | ORAL | Status: DC | PRN

## 2015-10-23 MED ORDER — ZOLPIDEM TARTRATE 5 MG PO TABS
5.0000 mg | ORAL_TABLET | Freq: Every evening | ORAL | Status: DC | PRN
  Filled 2015-10-23: qty 1

## 2015-10-23 MED ORDER — INSULIN ASPART 100 UNIT/ML ~~LOC~~ SOLN
0.0000 [IU] | Freq: Three times a day (TID) | SUBCUTANEOUS | Status: DC
Start: 2015-10-24 — End: 2015-11-01
  Administered 2015-10-24: 2 [IU] via SUBCUTANEOUS
  Administered 2015-10-24 – 2015-10-26 (×6): 3 [IU] via SUBCUTANEOUS
  Administered 2015-10-26: 5 [IU] via SUBCUTANEOUS
  Administered 2015-10-27: 3 [IU] via SUBCUTANEOUS
  Administered 2015-10-27: 5 [IU] via SUBCUTANEOUS
  Administered 2015-10-27 – 2015-10-28 (×3): 3 [IU] via SUBCUTANEOUS
  Administered 2015-10-28: 5 [IU] via SUBCUTANEOUS
  Administered 2015-10-29: 3 [IU] via SUBCUTANEOUS
  Administered 2015-10-29 (×2): 2 [IU] via SUBCUTANEOUS
  Administered 2015-10-30 (×3): 3 [IU] via SUBCUTANEOUS
  Administered 2015-10-31: 4 [IU] via SUBCUTANEOUS
  Administered 2015-10-31: 5 [IU] via SUBCUTANEOUS
  Administered 2015-10-31: 3 [IU] via SUBCUTANEOUS
  Administered 2015-11-01: 5 [IU] via SUBCUTANEOUS

## 2015-10-23 MED ORDER — INSULIN GLARGINE 100 UNIT/ML ~~LOC~~ SOLN
15.0000 [IU] | Freq: Every day | SUBCUTANEOUS | Status: DC
  Administered 2015-10-23 – 2015-10-31 (×9): 15 [IU] via SUBCUTANEOUS
  Filled 2015-10-23: qty 0.15

## 2015-10-23 MED ORDER — ACETAMINOPHEN 325 MG PO TABS: 650.0000 mg | ORAL_TABLET | ORAL | Status: DC | PRN

## 2015-10-23 MED ORDER — IBUPROFEN 200 MG PO TABS: 600.0000 mg | ORAL_TABLET | Freq: Three times a day (TID) | ORAL | Status: DC | PRN

## 2015-10-23 MED ORDER — INSULIN ASPART 100 UNIT/ML ~~LOC~~ SOLN
0.0000 [IU] | Freq: Three times a day (TID) | SUBCUTANEOUS | Status: DC
  Administered 2015-10-23: 3 [IU] via SUBCUTANEOUS
  Filled 2015-10-23: qty 1

## 2015-10-23 MED ORDER — IBUPROFEN 600 MG PO TABS: 600.0000 mg | ORAL_TABLET | Freq: Three times a day (TID) | ORAL | Status: DC | PRN

## 2015-10-23 NOTE — ED Notes (Signed)
Pt oriented to the unit. He states he is here because he is '' suicidal and tired of being homeless. '' he states he has been homeless for three years. Upon admit to this unit he continues to endorse passive SI stating '' yeah I still have thoughts '' he was able to contract for safety,and agrees to alert staff should he have thoughts of self injury. He denies any HI/A/V Hallucinations. He was offered something to drink, and requested the tv to be changed. Appears in no acute distress, vitals WDL.

## 2015-10-23 NOTE — ED Notes (Addendum)
[facility] to [facility]. Patient states he went to [facility] and was told they can not help him because it's the weekend. Patient states he than began drinking ETOH again and is now suicidal without a plan. Patinet last drink was this morning (after midnight), Patient reports drinking "a lot", 4-5 40oz beers. Patient is calm and cooperative at this time, ambulatory in triage. Patient also reports using cocaine tonight.

## 2015-10-23 NOTE — Tx Team (Signed)
Initial Interdisciplinary Treatment Plan   PATIENT STRESSORS: Financial difficulties Substance abuse   PATIENT STRENGTHS: Capable of independent living Communication skills   PROBLEM LIST: Problem List/Patient Goals Date to be addressed Date deferred Reason deferred Estimated date of resolution  I smoke crack cocaine and I want to stop      Patient complained of SI                                                 DISCHARGE CRITERIA:  Improved stabilization in mood, thinking, and/or behavior Reduction of life-threatening or endangering symptoms to within safe limits Withdrawal symptoms are absent or subacute and managed without 24-hour nursing intervention  PRELIMINARY DISCHARGE PLAN: Placement in alternative living arrangements  PATIENT/FAMIILY INVOLVEMENT: This treatment plan has been presented to and reviewed with the patient, [name] Kennedy, a.  The patient and family have been given the opportunity to ask questions and make suggestions.  Jerrye BushyLaRonica R Maigan Bittinger 10/23/2015, 6:27 PM
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
## 2015-10-23 NOTE — ED Notes (Signed)
Patient transferred to [facility].  Admits to some passive suicidal thoughts.  Left the unit ambulatory with [employer] [employer].  All belongings given to the driver.

## 2015-10-23 NOTE — ED Notes (Signed)
Psych at bedside, will draw blood and check sugar once assessment complete.

## 2015-10-23 NOTE — ED Notes (Signed)
Bed: [facility]40 Expected date:  Expected time:  Means of arrival:  Comments: Triage 4 

## 2015-10-23 NOTE — ED Notes (Signed)
[facility] called [facility]

## 2015-10-23 NOTE — ED Notes (Signed)
Pt has been changed into scrubs and wanded by security.  

## 2015-10-23 NOTE — BH Assessment (Signed)
Writer attempted to assess the patient however the Tele Psych machine is not working.  Writer put in a critical ticket for the machine to be fixed. Writer informed the TTS Irving Burton(Emily) stationed at [facility].

## 2015-10-23 NOTE — ED Provider Notes (Signed)
CSN: [id number]     Arrival date & time 10/23/15  0617 History   First MD Initiated Contact with Patient 10/23/15 [phone]     Chief Complaint  Patient presents with  . Suicidal  . alcohol detox     The history is provided by the patient.  Patient presents for substance abuse and depression. He was seen at behavioral health and is made for observation discharge 2 days ago. States that he was told to go to the [facility] but they did not take people on the weekend. States because of that he again to drink more alcohol and smoke crack. States he is depressed without frank plan. States he needs inpatient treatment. He is homeless. States that he drank around 4-5  40 ounce beers. Denies overdose.     Past Medical History  Diagnosis Date  . Suicidal ideations   . Diabetes mellitus without complication (HCC)   . Alcohol abuse    History reviewed. No pertinent past surgical history. History reviewed. No pertinent family history. Social History  Substance Use Topics  . Smoking status: Current Every Day Smoker -- 1.00 packs/day    Types: Cigarettes  . Smokeless tobacco: None  . Alcohol Use: 25.2 oz/week    42 Cans of beer per week    Review of Systems  Constitutional: Negative for activity change and appetite change.  Eyes: Negative for pain.  Respiratory: Negative for shortness of breath.   Cardiovascular: Negative for chest pain and leg swelling.  Gastrointestinal: Negative for nausea, vomiting, abdominal pain and diarrhea.  Genitourinary: Negative for flank pain.  Musculoskeletal: Negative for back pain and neck stiffness.  Skin: Negative for rash.  Neurological: Negative for weakness and headaches.  Psychiatric/Behavioral: Positive for suicidal ideas. Negative for hallucinations and behavioral problems.      Allergies  Review of patient's allergies indicates no known allergies.  Home Medications   Prior to Admission medications   Medication Sig Start Date End Date Taking?  Authorizing Provider  hydrOXYzine (ATARAX/VISTARIL) 25 MG tablet Take 1 tablet (25 mg total) by mouth 3 (three) times daily as needed (anxiety/agitation). Patient not taking: Reported on 10/23/2015 10/21/15   Shuvon B Rankin, NP   BP 128/72 mmHg  Pulse 81  Temp(Src) 98.4 F (36.9 C) (Oral)  Resp 18  SpO2 100% Physical Exam  Constitutional: He appears well-developed.  HENT:  Head: Atraumatic.  Neck: Neck supple.  Cardiovascular: Normal rate.   Pulmonary/Chest: Effort normal.  Abdominal: Soft.  Musculoskeletal: He exhibits no edema.  Neurological: He is alert.  Skin: Skin is warm.  Psychiatric: He has a normal mood and affect.    ED Course  Procedures (including critical care time) Labs Review Labs Reviewed  COMPREHENSIVE METABOLIC PANEL - Abnormal; Notable for the following:    CO2 21 (*)    Glucose, Bld 163 (*)    All other components within normal limits  ETHANOL - Abnormal; Notable for the following:    Alcohol, Ethyl (B) 31 (*)    All other components within normal limits  ACETAMINOPHEN LEVEL - Abnormal; Notable for the following:    Acetaminophen (Tylenol), Serum <10 (*)    All other components within normal limits  CBG MONITORING, ED - Abnormal; Notable for the following:    Glucose-Capillary 182 (*)    All other components within normal limits  CBG MONITORING, ED - Abnormal; Notable for the following:    Glucose-Capillary 156 (*)    All other components within normal limits  SALICYLATE LEVEL  CBC  URINE RAPID DRUG SCREEN, HOSP PERFORMED    Imaging Review No results found. I have personally reviewed and evaluated these images and lab results as part of my medical decision-making.   EKG Interpretation None      MDM   Final diagnoses:  Alcohol-induced mood disorder (HCC)  Uncomplicated alcohol dependence (HCC)    Patient with alcohol abuse. Also depression. Seen for same 2 days ago and discharged without him doing any follow-up. Appears to medically  cleared at this time.    Benjiman CoreNathan Trenna Kiely, MD 10/23/15 [phone]

## 2015-10-23 NOTE — ED Notes (Signed)
CBG- 182 

## 2015-10-23 NOTE — Progress Notes (Signed)
Pt. Did not attend group. 
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
## 2015-10-23 NOTE — ED Notes (Signed)
Patient has two bags of belongings in locker 28. 

## 2015-10-23 NOTE — BH Assessment (Signed)
[facility] Assessment Progress Note  Accepted to 305-1 per Inetta Fermoina. Pt can be transported via [facility]. Call report to [phone]. Pt signed voluntary paperwork and paperwork faxed.

## 2015-10-23 NOTE — Progress Notes (Signed)
Patient ID: [name] Kennedy, male   DOB: 12/28/1965, 50 y.o.   MRN: [id number] Patient was admitted to the unit due to substance abuse, the desire for detox and suicidal thoughts with no plan.  Patient was able to contract for safety upon admission presented with a depressed flat affect but was cooperative with admission process.  Patient acknowledged understanding of admission instructions.  Skin assessment complete and patient acclimated to the unit.

## 2015-10-23 NOTE — ED Notes (Signed)
[employer] released at this time. Patient has been wanded by security. Belongings being searched at this time by security. Patient's knife has been given to security at this time as well.

## 2015-10-24 ENCOUNTER — Encounter (HOSPITAL_COMMUNITY): Payer: Self-pay | Admitting: Psychiatry

## 2015-10-24 DIAGNOSIS — F1094 Alcohol use, unspecified with alcohol-induced mood disorder: Secondary | ICD-10-CM | POA: Diagnosis not present

## 2015-10-24 DIAGNOSIS — F10230 Alcohol dependence with withdrawal, uncomplicated: Secondary | ICD-10-CM | POA: Diagnosis not present

## 2015-10-24 DIAGNOSIS — F1410 Cocaine abuse, uncomplicated: Secondary | ICD-10-CM | POA: Diagnosis not present

## 2015-10-24 DIAGNOSIS — F333 Major depressive disorder, recurrent, severe with psychotic symptoms: Principal | ICD-10-CM

## 2015-10-24 DIAGNOSIS — R45851 Suicidal ideations: Secondary | ICD-10-CM

## 2015-10-24 LAB — GLUCOSE, CAPILLARY
GLUCOSE-CAPILLARY: 128 mg/dL — AB (ref 65–99)
GLUCOSE-CAPILLARY: 177 mg/dL — AB (ref 65–99)
GLUCOSE-CAPILLARY: 167 mg/dL — AB (ref 65–99)
GLUCOSE-CAPILLARY: 277 mg/dL — AB (ref 65–99)

## 2015-10-24 MED ORDER — HYDROXYZINE HCL 25 MG PO TABS: 25.0000 mg | ORAL_TABLET | Freq: Four times a day (QID) | ORAL | Status: AC | PRN

## 2015-10-24 MED ORDER — ONDANSETRON 4 MG PO TBDP: 4.0000 mg | ORAL_TABLET | Freq: Four times a day (QID) | ORAL | Status: AC | PRN

## 2015-10-24 MED ORDER — THIAMINE HCL 100 MG/ML IJ SOLN: 100.0000 mg | Freq: Once | INTRAMUSCULAR | Status: DC

## 2015-10-24 MED ORDER — LORAZEPAM 1 MG PO TABS: 1.0000 mg | ORAL_TABLET | Freq: Four times a day (QID) | ORAL | Status: AC | PRN

## 2015-10-24 MED ORDER — LORAZEPAM 1 MG PO TABS
1.0000 mg | ORAL_TABLET | Freq: Every day | ORAL | Status: AC
  Administered 2015-10-28: 1 mg via ORAL
  Filled 2015-10-24: qty 1

## 2015-10-24 MED ORDER — LORAZEPAM 1 MG PO TABS
1.0000 mg | ORAL_TABLET | Freq: Four times a day (QID) | ORAL | Status: AC
  Administered 2015-10-24 – 2015-10-25 (×6): 1 mg via ORAL
  Filled 2015-10-24 (×6): qty 1

## 2015-10-24 MED ORDER — ADULT MULTIVITAMIN W/MINERALS CH
1.0000 | ORAL_TABLET | Freq: Every day | ORAL | Status: DC
  Administered 2015-10-24 – 2015-10-31 (×8): 1 via ORAL
  Filled 2015-10-24 (×11): qty 1

## 2015-10-24 MED ORDER — LOPERAMIDE HCL 2 MG PO CAPS: 2.0000 mg | ORAL_CAPSULE | ORAL | Status: AC | PRN

## 2015-10-24 MED ORDER — LORAZEPAM 1 MG PO TABS
1.0000 mg | ORAL_TABLET | Freq: Two times a day (BID) | ORAL | Status: AC
  Administered 2015-10-27 (×2): 1 mg via ORAL
  Filled 2015-10-24 (×2): qty 1

## 2015-10-24 MED ORDER — LORAZEPAM 1 MG PO TABS
1.0000 mg | ORAL_TABLET | Freq: Three times a day (TID) | ORAL | Status: AC
  Administered 2015-10-26 (×3): 1 mg via ORAL
  Filled 2015-10-24 (×3): qty 1

## 2015-10-24 MED ORDER — VITAMIN B-1 100 MG PO TABS
100.0000 mg | ORAL_TABLET | Freq: Every day | ORAL | Status: DC
  Administered 2015-10-25 – 2015-10-31 (×7): 100 mg via ORAL
  Filled 2015-10-24 (×9): qty 1

## 2015-10-24 NOTE — Progress Notes (Signed)
Patient ID: [name] FlemingsOscar Kennedy, male   DOB: 01/04/1966, 50 y.o.   MRN: [id number] D: Patient in bed sleeping on approach. Denies  SI/HI/AVH and pain.Pt denies any withdrawal symptoms. No behavioral issues noted.  A: Support and encouragement offered as needed. Medications administered as prescribed.  R: Patient cooperative and appropriate on unit. Will continue to monitor patient for safety and stability.

## 2015-10-24 NOTE — Progress Notes (Signed)
D-  Patient has been in his room for the majority of the shift.  Patient is flat and forwards little to Clinical research associatewriter.  Patient did not engage Clinical research associatewriter in 1:1 therapeutic staff talks this shift.  Unable to assess patient for SI, HI or AVH.   A- Assess patient for safety, offer medications as prescribed, engage patient in 1:1 staff talks,   R-  Patient was able to contract for safety.

## 2015-10-24 NOTE — Plan of Care (Signed)
Problem: Diagnosis: Increased Risk For Suicide Attempt Goal: STG-Patient Will Attend All Groups On The Unit Outcome: Not Progressing Pt sleeping in room and did not attend evening AA group
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
## 2015-10-24 NOTE — [facility] Group Notes (Signed)
[facility] LCSW Group Therapy  10/24/2015 1:54 PM  Type of Therapy:  Group Therapy  Participation Level:  Did Not Attend-pt invited. Chose to remain in bed.  Summary of Progress/Problems: MHA Speaker came to talk about his personal journey with substance abuse and addiction.   Smart, Tristen Pennino LCSW 10/24/2015, 1:54 PM

## 2015-10-24 NOTE — Progress Notes (Signed)
Recreation Therapy Notes  Animal-Assisted Activity (AAA) Program Checklist/Progress Notes Patient Eligibility Criteria Checklist & Daily Group note for Rec Tx Intervention  Date: 03.21.2017 Time: 2:45pm Location: [address] Dayroom    AAA/T Program Assumption of Risk Form signed by Patient/ or Parent Legal Guardian yes  Patient is free of allergies or sever asthma yes  Patient reports no fear of animals yes  Patient reports no history of cruelty to animals yes  Patient understands his/her participation is voluntary yes  Behavioral Response: Did not attend.   Reagann Dolce L Jashawna Reever, LRT/CTRS        Hosea Hanawalt L 10/24/2015 3:05 PM 
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
## 2015-10-24 NOTE — [facility] Suicide Risk Assessment (Signed)
[facility]   Nursing information obtained from:    Demographic factors:    Current Mental Status:    Loss Factors:    Historical Factors:    Risk Reduction Factors:     Total Time spent with patient: 45 minutes Principal Problem: Depression, major, recurrent, severe with psychosis (HCC) Diagnosis:   Patient Active Problem List   Diagnosis Date Noted  . 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 Cocaine abuse [F14.10] 10/24/2015  . Alcohol-induced mood disorder (HCC) [F10.94] 06/26/2015  . Substance induced mood disorder (HCC) [F19.94] 06/26/2015  . Uncomplicated alcohol dependence (HCC) [F10.20]   . Alcohol dependence (HCC) [F10.20] 01/24/2013  . Cocaine dependence (HCC) [F14.20] 01/24/2013  . Suicidal ideation [R45.851] 01/23/2013   Subjective Data: see admission H and P  Continued Clinical Symptoms:  Alcohol Use Disorder Identification Test Final Score (AUDIT): 10 The "Alcohol Use Disorders Identification Test", Guidelines for Use in Primary Care, Second Edition.  World Science writerHealth Organization [facility](WHO). Score between 0-7:  no or low risk or alcohol related problems. Score between 8-15:  moderate risk of alcohol related problems. Score between 16-19:  high risk of alcohol related problems. Score 20 or above:  warrants further diagnostic evaluation for alcohol dependence and treatment.   CLINICAL FACTORS:   Depression:   Comorbid alcohol abuse/dependence Alcohol/Substance Abuse/Dependencies   Psychiatric Specialty Exam: ROS  Blood pressure 116/76, pulse 80, temperature 98 F (36.7 C), temperature source Oral, resp. rate 18, height 5\' 6"  (1.676 m), weight 66.225 kg (146 lb), SpO2 100 %.Body mass index is 23.58 kg/(m^2).   COGNITIVE FEATURES THAT CONTRIBUTE TO RISK:  Closed-mindedness, Polarized thinking and Thought constriction (tunnel vision)    SUICIDE RISK:   Moderate:  Frequent suicidal ideation with limited  intensity, and duration, some specificity in terms of plans, no associated intent, good self-control, limited dysphoria/symptomatology, some risk factors present, and identifiable protective factors, including available and accessible social support.  PLAN OF CARE: see admission H and P  I certify that inpatient services furnished can reasonably be expected to improve the patient's condition.   Rachael FeeLUGO,Reham Slabaugh A, MD 10/24/2015, 4:07 PM

## 2015-10-25 DIAGNOSIS — F10230 Alcohol dependence with withdrawal, uncomplicated: Secondary | ICD-10-CM | POA: Diagnosis not present

## 2015-10-25 DIAGNOSIS — F1094 Alcohol use, unspecified with alcohol-induced mood disorder: Secondary | ICD-10-CM | POA: Diagnosis not present

## 2015-10-25 DIAGNOSIS — F1410 Cocaine abuse, uncomplicated: Secondary | ICD-10-CM | POA: Diagnosis not present

## 2015-10-25 DIAGNOSIS — F333 Major depressive disorder, recurrent, severe with psychotic symptoms: Secondary | ICD-10-CM | POA: Diagnosis not present

## 2015-10-25 LAB — GLUCOSE, CAPILLARY
GLUCOSE-CAPILLARY: 167 mg/dL — AB (ref 65–99)
Glucose-Capillary: 188 mg/dL — ABNORMAL HIGH (ref 65–99)
Glucose-Capillary: 180 mg/dL — ABNORMAL HIGH (ref 65–99)
Glucose-Capillary: 170 mg/dL — ABNORMAL HIGH (ref 65–99)

## 2015-10-25 NOTE — Plan of Care (Signed)
Problem: Diagnosis: Increased Risk For Suicide Attempt Goal: STG-Patient Will Comply With Medication Regime Outcome: Progressing Pt compliant with taking meds. No adverse reactions to meds verbalized by pt.   Problem: Ineffective individual coping Goal: STG: Patient will remain free from self harm Outcome: Progressing Pt denies suicidal thoughts. Pt denies self harm behaviors. Pt verbally contracts for safety.

## 2015-10-25 NOTE — Progress Notes (Signed)
D   Pt isolates to his room and has little interaction with staff and peers    Pt is guarded and suspicious about his medications when they were being administered A    Verbal support given    Medications administered and effectiveness monitored    Q 15 min checks   Simple education on medications R    Pt safe at present 

## 2015-10-25 NOTE — Progress Notes (Signed)
[facility] MD Progress Note  10/25/2015 5:41 PM [name] Kennedy  MRN:  [id number] Subjective:  [name] states he slept better last night. The detox is going uneventfully. He is still wanting to go to [facility] in [place] Principal Problem: Depression, major, recurrent, severe with psychosis (HCC) Diagnosis:   Patient Active Problem List   Diagnosis Date Noted  . 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 Cocaine abuse [F14.10] 10/24/2015  . Alcohol-induced mood disorder (HCC) [F10.94] 06/26/2015  . Substance induced mood disorder (HCC) [F19.94] 06/26/2015  . Uncomplicated alcohol dependence (HCC) [F10.20]   . Alcohol dependence (HCC) [F10.20] 01/24/2013  . Cocaine dependence (HCC) [F14.20] 01/24/2013  . Suicidal ideation [R45.851] 01/23/2013   Total Time spent with patient: 20 minutes  Past Psychiatric History: see admission H and P  Past Medical History:  Past Medical History  Diagnosis Date  . Suicidal ideations   . Diabetes mellitus without complication (HCC)   . Alcohol abuse    History reviewed. No pertinent past surgical history. Family History: History reviewed. No pertinent family history. Family Psychiatric  History: see admission H and P Social History:  History  Alcohol Use  . 25.2 oz/week  . 42 Cans of beer per week     History  Drug Use  . Yes  . Special: Cocaine    Comment: last use 10/22/2015     Social History   Social History  . Marital Status: Single    Spouse Name: N/A  . Number of Children: N/A  . Years of Education: N/A   Social History Main Topics  . Smoking status: Current Every Day Smoker -- 1.00 packs/day    Types: Cigarettes  . Smokeless tobacco: None  . Alcohol Use: 25.2 oz/week    42 Cans of beer per week  . Drug Use: Yes    Special: Cocaine     Comment: last use 10/22/2015   . Sexual Activity: Yes    Birth Control/ Protection: Condom   Other Topics Concern  . None   Social History Narrative    Additional Social History:                         Sleep: Fair  Appetite:  Fair  Current Medications: Current Facility-Administered Medications  Medication Dose Route Frequency Provider Last Rate Last Dose  . acetaminophen (TYLENOL) tablet 650 mg  650 mg Oral Q4H PRN Charm Rings, NP      . alum & mag hydroxide-simeth (MAALOX/MYLANTA) 200-200-20 MG/5ML suspension 30 mL  30 mL Oral Q4H PRN Charm Rings, NP      . hydrOXYzine (ATARAX/VISTARIL) tablet 25 mg  25 mg Oral Q6H PRN Rachael Fee, MD      . ibuprofen (ADVIL,MOTRIN) tablet 600 mg  600 mg Oral Q8H PRN Charm Rings, NP      . insulin aspart (novoLOG) injection 0-15 Units  0-15 Units Subcutaneous TID WC Charm Rings, NP   3 Units at 10/25/15 1709  . insulin aspart (novoLOG) injection 0-5 Units  0-5 Units Subcutaneous QHS Charm Rings, NP   3 Units at 10/24/15 2211  . insulin glargine (LANTUS) injection 15 Units  15 Units Subcutaneous QHS Charm Rings, NP   15 Units at 10/24/15 2210  . loperamide (IMODIUM) capsule 2-4 mg  2-4 mg Oral PRN Rachael Fee, MD      . LORazepam (ATIVAN) tablet 1 mg  1 mg Oral Q6H  PRN Rachael Fee, MD      . LORazepam (ATIVAN) tablet 1 mg  1 mg Oral QID Rachael Fee, MD   1 mg at 10/25/15 1708   Followed by  . [START ON 10/26/2015] LORazepam (ATIVAN) tablet 1 mg  1 mg Oral TID Rachael Fee, MD       Followed by  . [START ON 10/27/2015] LORazepam (ATIVAN) tablet 1 mg  1 mg Oral BID Rachael Fee, MD       Followed by  . [START ON 10/28/2015] LORazepam (ATIVAN) tablet 1 mg  1 mg Oral Daily Rachael Fee, MD      . magnesium hydroxide (MILK OF MAGNESIA) suspension 30 mL  30 mL Oral Daily PRN Charm Rings, NP      . multivitamin with minerals tablet 1 tablet  1 tablet Oral Daily Rachael Fee, MD   1 tablet at 10/25/15 [phone]  . ondansetron (ZOFRAN) tablet 4 mg  4 mg Oral Q8H PRN Charm Rings, NP      . ondansetron (ZOFRAN-ODT) disintegrating tablet 4 mg  4 mg Oral Q6H PRN Rachael Fee, MD      . thiamine (B-1) injection 100 mg  100 mg Intramuscular Once Rachael Fee, MD   100 mg at 10/24/15 1610  . thiamine (VITAMIN B-1) tablet 100 mg  100 mg Oral Daily Rachael Fee, MD   100 mg at 10/25/15 9604  . zolpidem (AMBIEN) tablet 5 mg  5 mg Oral QHS PRN Charm Rings, NP        Lab Results:  Results for orders placed or performed during the hospital encounter of 10/23/15 (from the past 48 hour(s))  Glucose, capillary     Status: Abnormal   Collection Time: 10/23/15  9:20 PM  Result Value Ref Range   Glucose-Capillary 175 (H) 65 - 99 mg/dL  Glucose, capillary     Status: Abnormal   Collection Time: 10/24/15  6:28 AM  Result Value Ref Range   Glucose-Capillary 128 (H) 65 - 99 mg/dL  Glucose, capillary     Status: Abnormal   Collection Time: 10/24/15 12:11 PM  Result Value Ref Range   Glucose-Capillary 177 (H) 65 - 99 mg/dL  Glucose, capillary     Status: Abnormal   Collection Time: 10/24/15  5:03 PM  Result Value Ref Range   Glucose-Capillary 167 (H) 65 - 99 mg/dL  Glucose, capillary     Status: Abnormal   Collection Time: 10/24/15  9:07 PM  Result Value Ref Range   Glucose-Capillary 277 (H) 65 - 99 mg/dL  Glucose, capillary     Status: Abnormal   Collection Time: 10/25/15  6:06 AM  Result Value Ref Range   Glucose-Capillary 167 (H) 65 - 99 mg/dL  Glucose, capillary     Status: Abnormal   Collection Time: 10/25/15 11:51 AM  Result Value Ref Range   Glucose-Capillary 188 (H) 65 - 99 mg/dL   Comment 1 Notify RN    Comment 2 Document in Chart   Glucose, capillary     Status: Abnormal   Collection Time: 10/25/15  5:02 PM  Result Value Ref Range   Glucose-Capillary 180 (H) 65 - 99 mg/dL   Comment 1 Notify RN    Comment 2 Document in Chart     Blood Alcohol level:  Lab Results  Component Value Date   ETH 31* 10/23/2015   ETH 98* 10/19/2015    Physical Findings:  AIMS: Facial and Oral Movements Muscles of Facial Expression: None, normal Lips and  Perioral Area: None, normal Jaw: None, normal Tongue: None, normal,Extremity Movements Upper (arms, wrists, hands, fingers): None, normal Lower (legs, knees, ankles, toes): None, normal, Trunk Movements Neck, shoulders, hips: None, normal, Overall Severity Severity of abnormal movements (highest score from questions above): None, normal Incapacitation due to abnormal movements: None, normal Patient's awareness of abnormal movements (rate only patient's report): No Awareness, Dental Status Current problems with teeth and/or dentures?: No Does patient usually wear dentures?: No  CIWA:  CIWA-Ar Total: 0 COWS:  COWS Total Score: 1  Musculoskeletal: Strength & Muscle Tone: within normal limits Gait & Station: normal Patient leans: normal  Psychiatric Specialty Exam: Review of Systems  Constitutional: Positive for malaise/fatigue.  Eyes: Negative.   Respiratory: Negative.   Cardiovascular: Negative.   Gastrointestinal: Negative.   Genitourinary: Negative.   Musculoskeletal: Negative.   Skin: Negative.   Neurological: Negative.   Endo/Heme/Allergies: Negative.   Psychiatric/Behavioral: Positive for depression and substance abuse. The patient is nervous/anxious.     Blood pressure 106/60, pulse 79, temperature 97.8 F (36.6 C), temperature source Oral, resp. rate 18, height 5\' 6"  (1.676 m), weight 66.225 kg (146 lb), SpO2 100 %.Body mass index is 23.58 kg/(m^2).  General Appearance: Fairly Groomed  Patent attorneyye Contact::  Fair  Speech:  Clear and Coherent  Volume:  Normal  Mood:  Depressed  Affect:  Restricted  Thought Process:  Coherent and Goal Directed  Orientation:  Full (Time, Place, and Person)  Thought Content:  symptoms events worries concerns  Suicidal Thoughts:  No  Homicidal Thoughts:  No  Memory:  Immediate;   Fair Recent;   Fair Remote;   Fair  Judgement:  Fair  Insight:  Present  Psychomotor Activity:  Normal  Concentration:  Fair  Recall:  [employer]Fair  Fund of  Knowledge:Fair  Language: Fair  Akathisia:  No  Handed:  Right  AIMS (if indicated):     Assets:  Desire for Improvement  ADL's:  Intact  Cognition: WNL  Sleep:  Number of Hours: 6.5   Treatment Plan Summary: Daily contact with patient to assess and evaluate symptoms and progress in treatment and Medication management Supportive approach/coping skills Alcohol dependence; continue the Ativan detox protocol/work a relapse prevention plan Depression; continue to assess for the need for an antidepressant Work with CBT/mindfulness  Facilitate getting to [facility] in [place] A, MD 10/25/2015, 5:41 PM

## 2015-10-25 NOTE — Progress Notes (Signed)
Pt attended NA group this evening.  

## 2015-10-25 NOTE — [facility] Group Notes (Signed)
[facility] LCSW Group Therapy  10/25/2015 1:25 PM  Type of Therapy:  Group Therapy  Participation Level:  Did Not Attend -pt invited. Chose to remain in bed.   Modes of Intervention:  Confrontation, Discussion, Education, Exploration, Problem-solving, Rapport Building, Socialization and Support  Summary of Progress/Problems: Feelings around Diagnosis.    Smart, Jerzy Roepke LCSW 10/25/2015, 1:25 PM

## 2015-10-25 NOTE — Progress Notes (Signed)
D: Pt irritable on approach. Pt forwarded little information, cautious and appeared to be minimizing his symptoms. Pt rated depression 8/10 but would not specify any stressors. Pt denies suicidal thoughts. Pt denies withdrawal symptoms at this time. Pt appears withdrawn and isolates in his room. Pt compliant with taking meds. No adverse reactions to meds verbalized by pt. A: Medications administered as ordered per MD. Verbal support provided. Pt encouraged to attend groups. 15 minute checks performed for safety. R: Pt safety maintained.

## 2015-10-25 NOTE — [facility] Group Notes (Signed)
[facility]   10/25/2015 11:30 AM  Participation Quality:  Invited. DID NOT ATTEND. Pt chose to remain in bed.   Smart, Ernan Runkles LCSW

## 2015-10-26 DIAGNOSIS — F1094 Alcohol use, unspecified with alcohol-induced mood disorder: Secondary | ICD-10-CM | POA: Diagnosis not present

## 2015-10-26 DIAGNOSIS — F1410 Cocaine abuse, uncomplicated: Secondary | ICD-10-CM | POA: Diagnosis not present

## 2015-10-26 DIAGNOSIS — F333 Major depressive disorder, recurrent, severe with psychotic symptoms: Secondary | ICD-10-CM | POA: Diagnosis not present

## 2015-10-26 DIAGNOSIS — F10230 Alcohol dependence with withdrawal, uncomplicated: Secondary | ICD-10-CM | POA: Diagnosis not present

## 2015-10-26 LAB — GLUCOSE, CAPILLARY
GLUCOSE-CAPILLARY: 164 mg/dL — AB (ref 65–99)
GLUCOSE-CAPILLARY: 234 mg/dL — AB (ref 65–99)
GLUCOSE-CAPILLARY: 173 mg/dL — AB (ref 65–99)
Glucose-Capillary: 162 mg/dL — ABNORMAL HIGH (ref 65–99)

## 2015-10-26 MED ORDER — ESCITALOPRAM OXALATE 10 MG PO TABS
10.0000 mg | ORAL_TABLET | Freq: Every day | ORAL | Status: DC
  Administered 2015-10-26 – 2015-10-31 (×6): 10 mg via ORAL
  Filled 2015-10-26 (×9): qty 1

## 2015-10-26 NOTE — Progress Notes (Signed)
Patient ID: [name] Kennedy, male   DOB: 1966-03-26, 50 y.o.   MRN: [id number] [facility] MD Progress Note  10/26/2015 4:48 PM [name] Kennedy  MRN:  [id number] Subjective:  [name] Kennedy states he slept better last night again. The detox is going uneventfully but he feels tired. He states he feels down depressed, States he has not been able to make it but to few of the groups. Marland Kitchen He is still wanting to go to [facility] in [place] ( He will have to call as they need to hear from him)  Principal Problem: Depression, major, recurrent, severe with psychosis (HCC) Diagnosis:   Patient Active Problem List   Diagnosis Date Noted  . 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 Cocaine abuse [F14.10] 10/24/2015  . Alcohol-induced mood disorder (HCC) [F10.94] 06/26/2015  . Substance induced mood disorder (HCC) [F19.94] 06/26/2015  . Uncomplicated alcohol dependence (HCC) [F10.20]   . Alcohol dependence (HCC) [F10.20] 01/24/2013  . Cocaine dependence (HCC) [F14.20] 01/24/2013  . Suicidal ideation [R45.851] 01/23/2013   Total Time spent with patient: 20  Past Psychiatric History: see admission H and P  Past Medical History:  Past Medical History  Diagnosis Date  . Suicidal ideations   . Diabetes mellitus without complication (HCC)   . Alcohol abuse    History reviewed. No pertinent past surgical history. Family History: History reviewed. No pertinent family history. Family Psychiatric  History: see admission H and P Social History:  History  Alcohol Use  . 25.2 oz/week  . 42 Cans of beer per week     History  Drug Use  . Yes  . Special: Cocaine    Comment: last use 10/22/2015     Social History   Social History  . Marital Status: Single    Spouse Name: N/A  . Number of Children: N/A  . Years of Education: N/A   Social History Main Topics  . Smoking status: Current Every Day Smoker -- 1.00 packs/day    Types: Cigarettes  . Smokeless tobacco: None  . Alcohol Use:  25.2 oz/week    42 Cans of beer per week  . Drug Use: Yes    Special: Cocaine     Comment: last use 10/22/2015   . Sexual Activity: Yes    Birth Control/ Protection: Condom   Other Topics Concern  . None   Social History Narrative   Additional Social History:                         Sleep: Fair  Appetite:  Fair  Current Medications: Current Facility-Administered Medications  Medication Dose Route Frequency Provider Last Rate Last Dose  . acetaminophen (TYLENOL) tablet 650 mg  650 mg Oral Q4H PRN Charm Rings, NP      . alum & mag hydroxide-simeth (MAALOX/MYLANTA) 200-200-20 MG/5ML suspension 30 mL  30 mL Oral Q4H PRN Charm Rings, NP      . hydrOXYzine (ATARAX/VISTARIL) tablet 25 mg  25 mg Oral Q6H PRN Rachael Fee, MD      . ibuprofen (ADVIL,MOTRIN) tablet 600 mg  600 mg Oral Q8H PRN Charm Rings, NP      . insulin aspart (novoLOG) injection 0-15 Units  0-15 Units Subcutaneous TID WC Charm Rings, NP   3 Units at 10/26/15 1206  . insulin aspart (novoLOG) injection 0-5 Units  0-5 Units Subcutaneous QHS Charm Rings, NP   3 Units at 10/24/15 2211  .  insulin glargine (LANTUS) injection 15 Units  15 Units Subcutaneous QHS Charm Rings, NP   15 Units at 10/25/15 2210  . loperamide (IMODIUM) capsule 2-4 mg  2-4 mg Oral PRN Rachael Fee, MD      . LORazepam (ATIVAN) tablet 1 mg  1 mg Oral Q6H PRN Rachael Fee, MD      . LORazepam (ATIVAN) tablet 1 mg  1 mg Oral TID Rachael Fee, MD   1 mg at 10/26/15 1206   Followed by  . [START ON 10/27/2015] LORazepam (ATIVAN) tablet 1 mg  1 mg Oral BID Rachael Fee, MD       Followed by  . [START ON 10/28/2015] LORazepam (ATIVAN) tablet 1 mg  1 mg Oral Daily Rachael Fee, MD      . magnesium hydroxide (MILK OF MAGNESIA) suspension 30 mL  30 mL Oral Daily PRN Charm Rings, NP      . multivitamin with minerals tablet 1 tablet  1 tablet Oral Daily Rachael Fee, MD   1 tablet at 10/26/15 [phone]  . ondansetron (ZOFRAN) tablet  4 mg  4 mg Oral Q8H PRN Charm Rings, NP      . ondansetron (ZOFRAN-ODT) disintegrating tablet 4 mg  4 mg Oral Q6H PRN Rachael Fee, MD      . thiamine (B-1) injection 100 mg  100 mg Intramuscular Once Rachael Fee, MD   100 mg at 10/24/15 1610  . thiamine (VITAMIN B-1) tablet 100 mg  100 mg Oral Daily Rachael Fee, MD   100 mg at 10/26/15 9604  . zolpidem (AMBIEN) tablet 5 mg  5 mg Oral QHS PRN Charm Rings, NP        Lab Results:  Results for orders placed or performed during the hospital encounter of 10/23/15 (from the past 48 hour(s))  Glucose, capillary     Status: Abnormal   Collection Time: 10/24/15  5:03 PM  Result Value Ref Range   Glucose-Capillary 167 (H) 65 - 99 mg/dL  Glucose, capillary     Status: Abnormal   Collection Time: 10/24/15  9:07 PM  Result Value Ref Range   Glucose-Capillary 277 (H) 65 - 99 mg/dL  Glucose, capillary     Status: Abnormal   Collection Time: 10/25/15  6:06 AM  Result Value Ref Range   Glucose-Capillary 167 (H) 65 - 99 mg/dL  Glucose, capillary     Status: Abnormal   Collection Time: 10/25/15 11:51 AM  Result Value Ref Range   Glucose-Capillary 188 (H) 65 - 99 mg/dL   Comment 1 Notify RN    Comment 2 Document in Chart   Glucose, capillary     Status: Abnormal   Collection Time: 10/25/15  5:02 PM  Result Value Ref Range   Glucose-Capillary 180 (H) 65 - 99 mg/dL   Comment 1 Notify RN    Comment 2 Document in Chart   Glucose, capillary     Status: Abnormal   Collection Time: 10/25/15  9:13 PM  Result Value Ref Range   Glucose-Capillary 170 (H) 65 - 99 mg/dL  Glucose, capillary     Status: Abnormal   Collection Time: 10/26/15  6:13 AM  Result Value Ref Range   Glucose-Capillary 164 (H) 65 - 99 mg/dL  Glucose, capillary     Status: Abnormal   Collection Time: 10/26/15 11:31 AM  Result Value Ref Range   Glucose-Capillary 162 (H) 65 - 99 mg/dL  Comment 1 Notify RN    Comment 2 Document in Chart     Blood Alcohol level:  Lab  Results  Component Value Date   ETH 31* 10/23/2015   ETH 98* 10/19/2015    Physical Findings: AIMS: Facial and Oral Movements Muscles of Facial Expression: None, normal Lips and Perioral Area: None, normal Jaw: None, normal Tongue: None, normal,Extremity Movements Upper (arms, wrists, hands, fingers): None, normal Lower (legs, knees, ankles, toes): None, normal, Trunk Movements Neck, shoulders, hips: None, normal, Overall Severity Severity of abnormal movements (highest score from questions above): None, normal Incapacitation due to abnormal movements: None, normal Patient's awareness of abnormal movements (rate only patient's report): No Awareness, Dental Status Current problems with teeth and/or dentures?: No Does patient usually wear dentures?: No  CIWA:  CIWA-Ar Total: 0 COWS:  COWS Total Score: 1  Musculoskeletal: Strength & Muscle Tone: within normal limits Gait & Station: normal Patient leans: normal  Psychiatric Specialty Exam: Review of Systems  Constitutional: Positive for malaise/fatigue.  Eyes: Negative.   Respiratory: Negative.   Cardiovascular: Negative.   Gastrointestinal: Negative.   Genitourinary: Negative.   Musculoskeletal: Negative.   Skin: Negative.   Neurological: Negative.   Endo/Heme/Allergies: Negative.   Psychiatric/Behavioral: Positive for depression and substance abuse. The patient is nervous/anxious.     Blood pressure 123/79, pulse 82, temperature 97.3 F (36.3 C), temperature source Oral, resp. rate 16, height 5\' 6"  (1.676 m), weight 66.225 kg (146 lb), SpO2 100 %.Body mass index is 23.58 kg/(m^2).  General Appearance: Fairly Groomed  Patent attorneyye Contact::  Fair  Speech:  Clear and Coherent  Volume:  Normal  Mood:  Depressed  Affect:  Restricted  Thought Process:  Coherent and Goal Directed  Orientation:  Full (Time, Place, and Person)  Thought Content:  symptoms events worries concerns  Suicidal Thoughts:  No  Homicidal Thoughts:  No   Memory:  Immediate;   Fair Recent;   Fair Remote;   Fair  Judgement:  Fair  Insight:  Present  Psychomotor Activity:  Decrease  Concentration:  Fair  Recall:  [employer]Fair  Fund of Knowledge:Fair  Language: Fair  Akathisia:  No  Handed:  Right  AIMS (if indicated):     Assets:  Desire for Improvement  ADL's:  Intact  Cognition: WNL  Sleep:  Number of Hours: 6.5   Treatment Plan Summary: Daily contact with patient to assess and evaluate symptoms and progress in treatment and Medication management Supportive approach/coping skills Alcohol dependence; continue the Ativan detox protocol/work a relapse prevention plan Depression; will start Lexapro 10 mg daily Work with CBT/mindfulness  Facilitate getting to [facility] [place] Mayol A, MD 10/26/2015, 4:48 PM

## 2015-10-26 NOTE — [facility] Suicide Risk Assessment (Signed)
[facility] INPATIENT:  Family/Significant Other Suicide Prevention Education  Suicide Prevention Education:  Patient Refusal for Family/Significant Other Suicide Prevention Education: The patient [name] Kennedy has refused to provide written consent for family/significant other to be provided Family/Significant Other Suicide Prevention Education during admission and/or prior to discharge.  Physician notified.  SPE completed with pt, as pt refused to consent to family contact. SPI pamphlet provided to pt and pt was encouraged to share information with support network, ask questions, and talk about any concerns relating to SPE. Pt denies access to guns/firearms and verbalized understanding of information provided. Mobile Crisis information also provided to pt.   Smart, Gizel Riedlinger LCSW 10/26/2015, 10:43 AM

## 2015-10-26 NOTE — Progress Notes (Signed)
D   Pt isolates to his room and has little interaction with staff and peers    Pt is guarded and suspicious about his medications when they were being administered A    Verbal support given    Medications administered and effectiveness monitored    Q 15 min checks   Simple education on medications R    Pt safe at present

## 2015-10-26 NOTE — [facility] Group Notes (Signed)
[facility] LCSW Group Therapy  10/26/2015 11:16 AM  Type of Therapy:  Group Therapy  Participation Level:  Did Not Attend -pt invited. Chose to remain in bed.   Modes of Intervention:  Confrontation, Discussion, Education, Exploration, Problem-solving, Rapport Building, Socialization and Support  Summary of Progress/Problems: Emotion Regulation: This group focused on both positive and negative emotion identification and allowed group members to process ways to identify feelings, regulate negative emotions, and find healthy ways to manage internal/external emotions. Group members were asked to reflect on a time when their reaction to an emotion led to a negative outcome and explored how alternative responses using emotion regulation would have benefited them. Group members were also asked to discuss a time when emotion regulation was utilized when a negative emotion was experienced.   Smart, Tawanna Funk LCSW 10/26/2015, 11:16 AM

## 2015-10-26 NOTE — Progress Notes (Signed)
[facility] Group Notes:  (Nursing/MHT/Case Management/Adjunct)  Date:  10/26/2015  Time:  2100  Type of Therapy:  wrap up group  Participation Level:  None  Participation Quality:  Attentive  Affect:  Blunted and Irritable  Cognitive:  Lacking  Insight:  Limited  Engagement in Group:  None  Modes of Intervention:  Clarification, Education and Support  Summary of Progress/Problems: Pt declined to share when I called on him to share about his day. I asked him if he had any questions and pt shook his head no.   Marcille BuffyMcNeil, Saia Derossett S 10/26/2015, 11:44 PM

## 2015-10-26 NOTE — Progress Notes (Signed)
D:Pt presents with flat affect and depressed mood. Pt have intense staring and poor eye contact during shift assessment. Pt cautious, forwarded little information and guarded. Pt reports depression 7/10. Pt denies suicidal thoughts. Pt denies withdrawal symptoms. Pt continues to take Ativan as scheduled per protocol. Pt appears disheveled and in scrubs. Pt appears to have limited insight.  A: Medications administered as ordered per MD. Orders reviewed with pt. Verbal support provided. Pt encouraged to attend groups. 15 minute checks performed for safety. R: Pt safety maintained.

## 2015-10-26 NOTE — Progress Notes (Signed)
Adult Psychoeducational Group Note  Date:  10/26/2015 Time:  0930  Group Topic/Focus:  Orientation:   The focus of this group is to educate the patient on the purpose and policies of crisis stabilization and provide a format to answer questions about their admission.  The group details unit policies and expectations of patients while admitted.  Participation Level:  Did Not Attend  Participation Quality:    Affect:    Cognitive:     Insight:   Engagement in Group:    Modes of Intervention:    Additional Comments:    Alisyn Lequire L 10/26/2015, 2:10 PM

## 2015-10-27 DIAGNOSIS — F1094 Alcohol use, unspecified with alcohol-induced mood disorder: Secondary | ICD-10-CM | POA: Diagnosis not present

## 2015-10-27 DIAGNOSIS — F1410 Cocaine abuse, uncomplicated: Secondary | ICD-10-CM | POA: Diagnosis not present

## 2015-10-27 DIAGNOSIS — F333 Major depressive disorder, recurrent, severe with psychotic symptoms: Secondary | ICD-10-CM | POA: Diagnosis not present

## 2015-10-27 DIAGNOSIS — F10230 Alcohol dependence with withdrawal, uncomplicated: Secondary | ICD-10-CM | POA: Diagnosis not present

## 2015-10-27 LAB — GLUCOSE, CAPILLARY
GLUCOSE-CAPILLARY: 244 mg/dL — AB (ref 65–99)
GLUCOSE-CAPILLARY: 150 mg/dL — AB (ref 65–99)
Glucose-Capillary: 179 mg/dL — ABNORMAL HIGH (ref 65–99)
Glucose-Capillary: 153 mg/dL — ABNORMAL HIGH (ref 65–99)

## 2015-10-27 NOTE — Progress Notes (Signed)
[name] Kennedy has been minimally interactive on the unit. He has been in his room most of the evening asleep. He did not attend group. Denies SI/HI/AVH at this time. Contracts for safety. Encouragement and support given. Medications administered as prescribed. Continue to monitor Q 15 minutes for patient safety and medication effectiveness.

## 2015-10-27 NOTE — Progress Notes (Signed)
Pt was in the bed at the beginning of the shift, but got up for evening group and got his snack before returning to his room.  Pt has little to no interaction with his peers.  Writer went to pt's room to introduce self, and pt was not open to conversation.  He denies SI/HI/AVH and denies having any withdrawal symptoms.  Discharge plans are in process.  He was encouraged to make his needs known.  Support and encouragement offered.  Safety maintained with q15 minute checks.

## 2015-10-27 NOTE — Progress Notes (Signed)
CSW met with pt individually to discuss aftercare plan. CSW assisted patient with leaving message for [facility] in [place]. Pt is asking for a referral to [facility] residential and was given information about the [facility] program. CSW emailed Moshe Salisbury ([facility] team lead) to request that someone from program visit with pt to complete initial assessment prior to his dicharge. [facility] referral faxed.  Maxie Better, MSW, LCSW Clinical Social Worker 10/27/2015 3:08 PM

## 2015-10-27 NOTE — [facility] Group Notes (Signed)
[facility]   10/27/2015 11:31 AM  Participation Quality:  Invited. DID NOT ATTEND. Pt chose to remain in bed.   Smart, Miraya Cudney LCSW
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
## 2015-10-27 NOTE — Progress Notes (Addendum)
Patient found resting in bed. Did come up for meds upon request. Slow in ambulation. Affect very flat, minimal information forwarded. Mood depressed. Isolative to room/bed. Rating his depression at an 8/10, hopelessness and anxiety both at a 5/10.  Denies pain, physical problems. Did not attend AM SW group. Medicated per orders. Attempted to offer emotional support. Will encourage to complete self inventory. He denies SI/HI and remains safe on level III obs.

## 2015-10-27 NOTE — [facility] Group Notes (Signed)
[facility] LCSW Group Therapy  10/27/2015 12:57 PM  Type of Therapy:  Group Therapy  Participation Level:  Did Not Attend-pt invited. Chose to stay in bed.   Modes of Intervention:  Confrontation, Discussion, Education, Exploration, Problem-solving, Rapport Building, Socialization and Support  Summary of Progress/Problems: Feelings around Relapse. Group members discussed the meaning of relapse and shared personal stories of relapse, how it affected them and others, and how they perceived themselves during this time. Group members were encouraged to identify triggers, warning signs and coping skills used when facing the possibility of relapse. Social supports were discussed and explored in detail. Post Acute Withdrawal Syndrome (handout provided) was introduced and examined. Pt's were encouraged to ask questions, talk about key points associated with PAWS, and process this information in terms of relapse prevention.   Smart, Abrian Hanover LCSW 10/27/2015, 12:57 PM

## 2015-10-27 NOTE — Progress Notes (Signed)
Patient ID: [name] Kennedy, male   DOB: 07/06/1966, 50 y.o.   MRN: [id number] Patient ID: [name] Kennedy, male   DOB: 05/12/1966, 50 y.o.   MRN: [id number]  [facility]  10/27/2015 6:08 PM [name] Kennedy  MRN:  [id number] Subjective:  [name] Kennedy states he slept not as well. He was encouraged to get out of bed and be more active in the unit so he can be able to sleep at night. . The detox is going uneventfully He states he still feels down depressed, States he has not been able to make it but to few of the groups. Marland Kitchen. He is still wanting to go to [facility] [place] ( He will have to call as they need to hear from him)  Principal Problem: Depression, major, recurrent, severe with psychosis (HCC) Diagnosis:   Patient Active Problem List   Diagnosis Date Noted  . 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 Cocaine abuse [F14.10] 10/24/2015  . Alcohol-induced mood disorder (HCC) [F10.94] 06/26/2015  . Substance induced mood disorder (HCC) [F19.94] 06/26/2015  . Uncomplicated alcohol dependence (HCC) [F10.20]   . Alcohol dependence (HCC) [F10.20] 01/24/2013  . Cocaine dependence (HCC) [F14.20] 01/24/2013  . Suicidal ideation [R45.851] 01/23/2013   Total Time spent with patient: 20  Past Psychiatric History: see admission H and P  Past Medical History:  Past Medical History  Diagnosis Date  . Suicidal ideations   . Diabetes mellitus without complication (HCC)   . Alcohol abuse    History reviewed. No pertinent past surgical history. Family History: History reviewed. No pertinent family history. Family Psychiatric  History: see admission H and P Social History:  History  Alcohol Use  . 25.2 oz/week  . 42 Cans of beer per week     History  Drug Use  . Yes  . Special: Cocaine    Comment: last use 10/22/2015     Social History   Social History  . Marital Status: Single    Spouse Name: N/A  . Number of Children: N/A  . Years of Education: N/A    Social History Main Topics  . Smoking status: Current Every Day Smoker -- 1.00 packs/day    Types: Cigarettes  . Smokeless tobacco: None  . Alcohol Use: 25.2 oz/week    42 Cans of beer per week  . Drug Use: Yes    Special: Cocaine     Comment: last use 10/22/2015   . Sexual Activity: Yes    Birth Control/ Protection: Condom   Other Topics Concern  . None   Social History Narrative   Additional Social History:                         Sleep: Fair  Appetite:  Fair  Current Medications: Current Facility-Administered Medications  Medication Dose Route Frequency Provider Last Rate Last Dose  . acetaminophen (TYLENOL) tablet 650 mg  650 mg Oral Q4H PRN Charm RingsJamison Y Lord, NP      . alum & mag hydroxide-simeth (MAALOX/MYLANTA) 200-200-20 MG/5ML suspension 30 mL  30 mL Oral Q4H PRN Charm RingsJamison Y Lord, NP      . escitalopram (LEXAPRO) tablet 10 mg  10 mg Oral Daily Rachael FeeIrving A Radie Berges, MD   10 mg at 10/27/15 0849  . ibuprofen (ADVIL,MOTRIN) tablet 600 mg  600 mg Oral Q8H PRN Charm RingsJamison Y Lord, NP      . insulin aspart (novoLOG) injection 0-15 Units  0-15 Units  Subcutaneous TID WC Charm Rings, NP   5 Units at 10/27/15 1716  . insulin aspart (novoLOG) injection 0-5 Units  0-5 Units Subcutaneous QHS Charm Rings, NP   3 Units at 10/24/15 2211  . insulin glargine (LANTUS) injection 15 Units  15 Units Subcutaneous QHS Charm Rings, NP   15 Units at 10/26/15 2152  . [START ON 10/28/2015] LORazepam (ATIVAN) tablet 1 mg  1 mg Oral Daily Rachael Fee, MD      . magnesium hydroxide (MILK OF MAGNESIA) suspension 30 mL  30 mL Oral Daily PRN Charm Rings, NP      . multivitamin with minerals tablet 1 tablet  1 tablet Oral Daily Rachael Fee, MD   1 tablet at 10/27/15 [id number]  . ondansetron (ZOFRAN) tablet 4 mg  4 mg Oral Q8H PRN Charm Rings, NP      . thiamine (B-1) injection 100 mg  100 mg Intramuscular Once Rachael Fee, MD   100 mg at 10/24/15 1610  . thiamine (VITAMIN B-1) tablet 100 mg   100 mg Oral Daily Rachael Fee, MD   100 mg at 10/27/15 0849  . zolpidem (AMBIEN) tablet 5 mg  5 mg Oral QHS PRN Charm Rings, NP        Lab Results:  Results for orders placed or performed during the hospital encounter of 10/23/15 (from the past 48 hour(s))  Glucose, capillary     Status: Abnormal   Collection Time: 10/25/15  9:13 PM  Result Value Ref Range   Glucose-Capillary 170 (H) 65 - 99 mg/dL  Glucose, capillary     Status: Abnormal   Collection Time: 10/26/15  6:13 AM  Result Value Ref Range   Glucose-Capillary 164 (H) 65 - 99 mg/dL  Glucose, capillary     Status: Abnormal   Collection Time: 10/26/15 11:31 AM  Result Value Ref Range   Glucose-Capillary 162 (H) 65 - 99 mg/dL   Comment 1 Notify RN    Comment 2 Document in Chart   Glucose, capillary     Status: Abnormal   Collection Time: 10/26/15  5:08 PM  Result Value Ref Range   Glucose-Capillary 234 (H) 65 - 99 mg/dL   Comment 1 Notify RN    Comment 2 Document in Chart   Glucose, capillary     Status: Abnormal   Collection Time: 10/26/15  9:23 PM  Result Value Ref Range   Glucose-Capillary 173 (H) 65 - 99 mg/dL   Comment 1 Notify RN    Comment 2 Document in Chart   Glucose, capillary     Status: Abnormal   Collection Time: 10/27/15  6:16 AM  Result Value Ref Range   Glucose-Capillary 179 (H) 65 - 99 mg/dL  Glucose, capillary     Status: Abnormal   Collection Time: 10/27/15 11:17 AM  Result Value Ref Range   Glucose-Capillary 153 (H) 65 - 99 mg/dL   Comment 1 Notify RN    Comment 2 Document in Chart   Glucose, capillary     Status: Abnormal   Collection Time: 10/27/15  5:01 PM  Result Value Ref Range   Glucose-Capillary 244 (H) 65 - 99 mg/dL    Blood Alcohol level:  Lab Results  Component Value Date   ETH 31* 10/23/2015   ETH 98* 10/19/2015    Physical Findings: AIMS: Facial and Oral Movements Muscles of Facial Expression: None, normal Lips and Perioral Area: None, normal Jaw: None,  normal Tongue: None, normal,Extremity Movements Upper (arms, wrists, hands, fingers): None, normal Lower (legs, knees, ankles, toes): None, normal, Trunk Movements Neck, shoulders, hips: None, normal, Overall Severity Severity of abnormal movements (highest score from questions above): None, normal Incapacitation due to abnormal movements: None, normal Patient's awareness of abnormal movements (rate only patient's report): No Awareness, Dental Status Current problems with teeth and/or dentures?: No Does patient usually wear dentures?: No  CIWA:  CIWA-Ar Total: 0 COWS:  COWS Total Score: 1  Musculoskeletal: Strength & Muscle Tone: within normal limits Gait & Station: normal Patient leans: normal  Psychiatric Specialty Exam: Review of Systems  Constitutional: Positive for malaise/fatigue.  Eyes: Negative.   Respiratory: Negative.   Cardiovascular: Negative.   Gastrointestinal: Negative.   Genitourinary: Negative.   Musculoskeletal: Negative.   Skin: Negative.   Neurological: Negative.   Endo/Heme/Allergies: Negative.   Psychiatric/Behavioral: Positive for depression and substance abuse. The patient is nervous/anxious.     Blood pressure 125/78, pulse 97, temperature 98.9 F (37.2 C), temperature source Oral, resp. rate 16, height  (1.676 m), weight 66.225 kg (146 lb), SpO2 100 %.Body mass index is 23.58 kg/(m^2).  General Appearance: Fairly Groomed  Patent attorney::  Fair  Speech:  Clear and Coherent  Volume:  Normal  Mood:  Depressed  Affect:  Restricted  Thought Process:  Coherent and Goal Directed  Orientation:  Full (Time, Place, and Person)  Thought Content:  symptoms events worries concerns  Suicidal Thoughts:  No  Homicidal Thoughts:  No  Memory:  Immediate;   Fair Recent;   Fair Remote;   Fair  Judgement:  Fair  Insight:  Present  Psychomotor Activity:  Decrease  Concentration:  Fair  Recall:  [employer] of Knowledge:Fair  Language: Fair  Akathisia:  No   Handed:  Right  AIMS (if indicated):     Assets:  Desire for Improvement  ADL's:  Intact  Cognition: WNL  Sleep:  Number of Hours: 4   Treatment Plan Summary: Daily contact with patient to assess and evaluate symptoms and progress in treatment and Medication management Supportive approach/coping skills Alcohol dependence; continue the Ativan detox protocol/work a relapse prevention plan Depression; will continue  Lexapro 10 mg daily Work with CBT/mindfulness/stress management/problem solving/behavioral activation Facilitate getting to [facility] in [place], MD 10/27/2015, 6:08 PM

## 2015-10-28 ENCOUNTER — Encounter (HOSPITAL_COMMUNITY): Payer: Self-pay | Admitting: Registered Nurse

## 2015-10-28 DIAGNOSIS — G4700 Insomnia, unspecified: Secondary | ICD-10-CM | POA: Diagnosis not present

## 2015-10-28 DIAGNOSIS — F1410 Cocaine abuse, uncomplicated: Secondary | ICD-10-CM | POA: Diagnosis not present

## 2015-10-28 DIAGNOSIS — F333 Major depressive disorder, recurrent, severe with psychotic symptoms: Secondary | ICD-10-CM | POA: Diagnosis not present

## 2015-10-28 DIAGNOSIS — F10230 Alcohol dependence with withdrawal, uncomplicated: Secondary | ICD-10-CM | POA: Diagnosis not present

## 2015-10-28 LAB — GLUCOSE, CAPILLARY
GLUCOSE-CAPILLARY: 172 mg/dL — AB (ref 65–99)
Glucose-Capillary: 220 mg/dL — ABNORMAL HIGH (ref 65–99)
Glucose-Capillary: 170 mg/dL — ABNORMAL HIGH (ref 65–99)
Glucose-Capillary: 156 mg/dL — ABNORMAL HIGH (ref 65–99)

## 2015-10-28 NOTE — Progress Notes (Signed)
Patient's status remains fairly unchanged. He continues to be extremely guarded, blank/flat affect. Forwards minimal information. Denies pain, physical problems. Refused to complete self inventory even with assist. Medicated per orders, 2 units novolog given for CBG of "170." Support offered. Denies SI/HI and remains safe. Lawrence MarseillesFriedman, Deshay Blumenfeld Eakes

## 2015-10-28 NOTE — [facility] Group Notes (Signed)
[facility] Group Notes: (Clinical Social Work)   10/28/2015      Type of Therapy:  Group Therapy   Participation Level:  Did Not Attend despite MHT prompting   Ambrose MantleMareida Grossman-Orr, LCSW 10/28/2015, 12:26 PM

## 2015-10-28 NOTE — Progress Notes (Signed)
The patient did not attend last evening's A.A. Meeting.

## 2015-10-28 NOTE — Progress Notes (Signed)
Patient ID: [name] Kennedy, male   DOB: Oct 18, 1965, 50 y.o.   MRN: [id number]  [facility] MD Progress Note  10/28/2015 12:55 PM [name] Kennedy  MRN:  [id number]   Subjective: "That guy lied.  [facility] don't take in nobody on the weekend" Patient seen by this provider, case reviewed with social worker and nursing.  On evaluation:  [name] Kennedy reports that he is trying to find long term rehab so that when "I get out I can work on getting myself together."  States that he continues to have suicidal thoughts off/on "but its gone be like that until I can get my situation straight; get my life together."  States that he has not spoken to his wife "and I want it to stay like that to."  Reports that he is eating/sleeping without difficulty; attending/participating in group sessions and tolerating his medications without adverse reaction.   At this time he denies suicidal/homicidal ideation, and paranoia.  Continues to endorse auditory hallucinations stating that the voice are "just saying degrading things to me."  Principal Problem: Depression, major, recurrent, severe with psychosis (HCC) Diagnosis:   Patient Active Problem List   Diagnosis Date Noted  . 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 Cocaine abuse [F14.10] 10/24/2015  . Alcohol-induced mood disorder (HCC) [F10.94] 06/26/2015  . Substance induced mood disorder (HCC) [F19.94] 06/26/2015  . Uncomplicated alcohol dependence (HCC) [F10.20]   . Alcohol dependence (HCC) [F10.20] 01/24/2013  . Cocaine dependence (HCC) [F14.20] 01/24/2013  . Suicidal ideation [R45.851] 01/23/2013   Total Time spent with patient: 15 minutes  Past Psychiatric History: see admission H&P  Past Medical History:  Past Medical History  Diagnosis Date  . Suicidal ideations   . Diabetes mellitus without complication (HCC)   . Alcohol abuse    History reviewed. No pertinent past surgical history. Family History: History reviewed. No  pertinent family history. Family Psychiatric  History: see admission H&P Social History:  History  Alcohol Use  . 25.2 oz/week  . 42 Cans of beer per week     History  Drug Use  . Yes  . Special: Cocaine    Comment: last use 10/22/2015     Social History   Social History  . Marital Status: Single    Spouse Name: N/A  . Number of Children: N/A  . Years of Education: N/A   Social History Main Topics  . Smoking status: Current Every Day Smoker -- 1.00 packs/day    Types: Cigarettes  . Smokeless tobacco: None  . Alcohol Use: 25.2 oz/week    42 Cans of beer per week  . Drug Use: Yes    Special: Cocaine     Comment: last use 10/22/2015   . Sexual Activity: Yes    Birth Control/ Protection: Condom   Other Topics Concern  . None   Social History Narrative   Additional Social History:   Sleep: Fair  Appetite:  Good  Current Medications: Current Facility-Administered Medications  Medication Dose Route Frequency Provider Last Rate Last Dose  . acetaminophen (TYLENOL) tablet 650 mg  650 mg Oral Q4H PRN Charm Rings, NP      . alum & mag hydroxide-simeth (MAALOX/MYLANTA) 200-200-20 MG/5ML suspension 30 mL  30 mL Oral Q4H PRN Charm Rings, NP      . escitalopram (LEXAPRO) tablet 10 mg  10 mg Oral Daily Rachael Fee, MD   10 mg at 10/28/15 1008  . ibuprofen (ADVIL,MOTRIN) tablet 600 mg  600 mg Oral Q8H PRN Charm Rings, NP      . insulin aspart (novoLOG) injection 0-15 Units  0-15 Units Subcutaneous TID WC Charm Rings, NP   5 Units at 10/28/15 1217  . insulin aspart (novoLOG) injection 0-5 Units  0-5 Units Subcutaneous QHS Charm Rings, NP   3 Units at 10/24/15 2211  . insulin glargine (LANTUS) injection 15 Units  15 Units Subcutaneous QHS Charm Rings, NP   15 Units at 10/27/15 2148  . magnesium hydroxide (MILK OF MAGNESIA) suspension 30 mL  30 mL Oral Daily PRN Charm Rings, NP      . multivitamin with minerals tablet 1 tablet  1 tablet Oral Daily Rachael Fee, MD   1 tablet at 10/28/15 1008  . ondansetron (ZOFRAN) tablet 4 mg  4 mg Oral Q8H PRN Charm Rings, NP      . thiamine (B-1) injection 100 mg  100 mg Intramuscular Once Rachael Fee, MD   100 mg at 10/24/15 1610  . thiamine (VITAMIN B-1) tablet 100 mg  100 mg Oral Daily Rachael Fee, MD   100 mg at 10/28/15 1009  . zolpidem (AMBIEN) tablet 5 mg  5 mg Oral QHS PRN Charm Rings, NP        Lab Results:  Results for orders placed or performed during the hospital encounter of 10/23/15 (from the past 48 hour(s))  Glucose, capillary     Status: Abnormal   Collection Time: 10/26/15  5:08 PM  Result Value Ref Range   Glucose-Capillary 234 (H) 65 - 99 mg/dL   Comment 1 Notify RN    Comment 2 Document in Chart   Glucose, capillary     Status: Abnormal   Collection Time: 10/26/15  9:23 PM  Result Value Ref Range   Glucose-Capillary 173 (H) 65 - 99 mg/dL   Comment 1 Notify RN    Comment 2 Document in Chart   Glucose, capillary     Status: Abnormal   Collection Time: 10/27/15  6:16 AM  Result Value Ref Range   Glucose-Capillary 179 (H) 65 - 99 mg/dL  Glucose, capillary     Status: Abnormal   Collection Time: 10/27/15 11:17 AM  Result Value Ref Range   Glucose-Capillary 153 (H) 65 - 99 mg/dL   Comment 1 Notify RN    Comment 2 Document in Chart   Glucose, capillary     Status: Abnormal   Collection Time: 10/27/15  5:01 PM  Result Value Ref Range   Glucose-Capillary 244 (H) 65 - 99 mg/dL  Glucose, capillary     Status: Abnormal   Collection Time: 10/27/15  8:46 PM  Result Value Ref Range   Glucose-Capillary 150 (H) 65 - 99 mg/dL  Glucose, capillary     Status: Abnormal   Collection Time: 10/28/15  6:08 AM  Result Value Ref Range   Glucose-Capillary 172 (H) 65 - 99 mg/dL   Comment 1 Notify RN   Glucose, capillary     Status: Abnormal   Collection Time: 10/28/15 11:55 AM  Result Value Ref Range   Glucose-Capillary 220 (H) 65 - 99 mg/dL    Blood Alcohol level:  Lab Results   Component Value Date   ETH 31* 10/23/2015   ETH 98* 10/19/2015    Physical Findings: AIMS: Facial and Oral Movements Muscles of Facial Expression: None, normal Lips and Perioral Area: None, normal Jaw: None, normal Tongue: None, normal,Extremity Movements Upper (arms,  wrists, hands, fingers): None, normal Lower (legs, knees, ankles, toes): None, normal, Trunk Movements Neck, shoulders, hips: None, normal, Overall Severity Severity of abnormal movements (highest score from questions above): None, normal Incapacitation due to abnormal movements: None, normal Patient's awareness of abnormal movements (rate only patient's report): No Awareness, Dental Status Current problems with teeth and/or dentures?: No Does patient usually wear dentures?: No  CIWA:  CIWA-Ar Total: 0 COWS:  COWS Total Score: 1  Musculoskeletal: Strength & Muscle Tone: within normal limits Gait & Station: normal Patient leans: normal  Psychiatric Specialty Exam: Review of Systems  Constitutional: Positive for malaise/fatigue.  Psychiatric/Behavioral: Positive for depression, hallucinations and substance abuse. The patient is nervous/anxious.   All other systems reviewed and are negative.   Blood pressure 115/82, pulse 82, temperature 97.9 F (36.6 C), temperature source Oral, resp. rate 16, height 5\' 6"  (1.676 m), weight 66.225 kg (146 lb), SpO2 100 %.Body mass index is 23.58 kg/(m^2).  General Appearance: Fairly Groomed  Patent attorneyye Contact::  Good  Speech:  Clear and Coherent  Volume:  Normal  Mood:  Depressed  Affect:  Congruent  Thought Process:  Coherent and Goal Directed  Orientation:  Full (Time, Place, and Person)  Thought Content:  symptoms events worries concerns  Suicidal Thoughts:  No  Homicidal Thoughts:  No  Memory:  Immediate;   Good Recent;   Good Remote;   Good  Judgement:  Fair  Insight:  Present  Psychomotor Activity:  Normal  Concentration:  Fair  Recall:  Good  Fund of Knowledge:Fair   Language: Good  Akathisia:  No  Handed:  Right  AIMS (if indicated):     Assets:  Communication Skills Desire for Improvement  ADL's:  Intact  Cognition: WNL  Sleep:  Number of Hours: 4   Treatment Plan Summary: Daily contact with patient to assess and evaluate symptoms and progress in treatment and Medication management Continue supportive approach/coping skills Alcohol dependence; continue the Ativan detox protocol/work a relapse prevention plan Depression; Continue  Lexapro 10 mg daily Insomnia: Continue Ambien 5 mg Q hs prn Work with CBT/mindfulness/stress management/problem solving/behavioral activation Social Work to facilitate getting to [facility] [place]  Rankin, AnnettaShuvon, NP 10/28/2015, 12:55 PM  I agreed with findings and treatment plan of this patient

## 2015-10-28 NOTE — Progress Notes (Signed)
[name] Kennedy has not been attending ANY groups. He has been refusing to participate in any AA meetings as well. He is very angry and irritable. Minimally interactive.  His affect is flat. Rates Depression 8/10 Anxiety 8/10.  He denies SI at this time. He is very resistant to any additional conversation. When I attempted to ask him about his day he walked off mid sentence without saying anything. When I questioned him as to why he walked off suddenly he abruptly states "cause I don't wanna talk anymore." He then goes into dayroom to watch tv and refuses to continue to conversation. Continue to monitor Q15 minutes for patient safety.

## 2015-10-29 DIAGNOSIS — F1410 Cocaine abuse, uncomplicated: Secondary | ICD-10-CM | POA: Diagnosis not present

## 2015-10-29 DIAGNOSIS — G4700 Insomnia, unspecified: Secondary | ICD-10-CM | POA: Diagnosis not present

## 2015-10-29 DIAGNOSIS — F10230 Alcohol dependence with withdrawal, uncomplicated: Secondary | ICD-10-CM | POA: Diagnosis not present

## 2015-10-29 DIAGNOSIS — F333 Major depressive disorder, recurrent, severe with psychotic symptoms: Secondary | ICD-10-CM | POA: Diagnosis not present

## 2015-10-29 LAB — GLUCOSE, CAPILLARY
GLUCOSE-CAPILLARY: 136 mg/dL — AB (ref 65–99)
GLUCOSE-CAPILLARY: 143 mg/dL — AB (ref 65–99)
Glucose-Capillary: 233 mg/dL — ABNORMAL HIGH (ref 65–99)
Glucose-Capillary: 307 mg/dL — ABNORMAL HIGH (ref 65–99)

## 2015-10-29 MED ORDER — TRAZODONE HCL 50 MG PO TABS
50.0000 mg | ORAL_TABLET | Freq: Every day | ORAL | Status: DC
  Filled 2015-10-29 (×4): qty 1

## 2015-10-29 NOTE — [facility] Group Notes (Signed)
[facility] Group Notes: (Clinical Social Work)   10/29/2015      Type of Therapy:  Group Therapy   Participation Level:  Did Not Attend despite MHT prompting   Delvon Chipps Grossman-Orr, LCSW 10/29/2015, 12:55 PM     

## 2015-10-29 NOTE — Progress Notes (Signed)
Patient ID: [name] Kennedy, male   DOB: 01/14/1966, 50 y.o.   MRN: [id number]  [facility]  10/29/2015 11:33 AM [name] Kennedy  MRN:  [id number]   Subjective: "I'm doing the same as yesterday no better" Patient seen by this provider, case reviewed with social worker and nursing.  On evaluation:  [name] Kennedy reports that there has been no improvement in his depression and that he did not sleep well last night "bad dream."  Reports that he is eating without difficulty; and tolerating his medications.  Continues to endorse auditory hallucinations that belittle him.  Continues to denies suicidal/homicidal ideation, and paranoia.        At this time he Principal Problem: Depression, major, recurrent, severe with psychosis (HCC) Diagnosis:   Patient Active Problem List   Diagnosis Date Noted  . 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 Cocaine abuse [F14.10] 10/24/2015  . Alcohol-induced mood disorder (HCC) [F10.94] 06/26/2015  . Substance induced mood disorder (HCC) [F19.94] 06/26/2015  . Uncomplicated alcohol dependence (HCC) [F10.20]   . Alcohol dependence (HCC) [F10.20] 01/24/2013  . Cocaine dependence (HCC) [F14.20] 01/24/2013  . Suicidal ideation [R45.851] 01/23/2013   Total Time spent with patient: 15 minutes  Past Psychiatric History: see admission H&P  Past Medical History:  Past Medical History  Diagnosis Date  . Suicidal ideations   . Diabetes mellitus without complication (HCC)   . Alcohol abuse    History reviewed. No pertinent past surgical history. Family History: History reviewed. No pertinent family history. Family Psychiatric  History: see admission H&P Social History:  History  Alcohol Use  . 25.2 oz/week  . 42 Cans of beer per week     History  Drug Use  . Yes  . Special: Cocaine    Comment: last use 10/22/2015     Social History   Social History  . Marital Status: Single    Spouse Name: N/A  . Number of  Children: N/A  . Years of Education: N/A   Social History Main Topics  . Smoking status: Current Every Day Smoker -- 1.00 packs/day    Types: Cigarettes  . Smokeless tobacco: None  . Alcohol Use: 25.2 oz/week    42 Cans of beer per week  . Drug Use: Yes    Special: Cocaine     Comment: last use 10/22/2015   . Sexual Activity: Yes    Birth Control/ Protection: Condom   Other Topics Concern  . None   Social History Narrative   Additional Social History:   Sleep: Fair  Appetite:  Good  Current Medications: Current Facility-Administered Medications  Medication Dose Route Frequency Provider Last Rate Last Dose  . acetaminophen (TYLENOL) tablet 650 mg  650 mg Oral Q4H PRN Charm RingsJamison Y Lord, NP      . alum & mag hydroxide-simeth (MAALOX/MYLANTA) 200-200-20 MG/5ML suspension 30 mL  30 mL Oral Q4H PRN Charm RingsJamison Y Lord, NP      . escitalopram (LEXAPRO) tablet 10 mg  10 mg Oral Daily Rachael FeeIrving A Lugo, MD   10 mg at 10/29/15 [phone]  . ibuprofen (ADVIL,MOTRIN) tablet 600 mg  600 mg Oral Q8H PRN Charm RingsJamison Y Lord, NP      . insulin aspart (novoLOG) injection 0-15 Units  0-15 Units Subcutaneous TID WC Charm RingsJamison Y Lord, NP   2 Units at 10/29/15 0636  . insulin aspart (novoLOG) injection 0-5 Units  0-5 Units Subcutaneous QHS Charm RingsJamison Y Lord, NP   3 Units at  10/24/15 2211  . insulin glargine (LANTUS) injection 15 Units  15 Units Subcutaneous QHS Charm Rings, NP   15 Units at 10/28/15 2158  . magnesium hydroxide (MILK OF MAGNESIA) suspension 30 mL  30 mL Oral Daily PRN Charm Rings, NP      . multivitamin with minerals tablet 1 tablet  1 tablet Oral Daily Rachael Fee, MD   1 tablet at 10/29/15 [phone]  . ondansetron (ZOFRAN) tablet 4 mg  4 mg Oral Q8H PRN Charm Rings, NP      . thiamine (B-1) injection 100 mg  100 mg Intramuscular Once Rachael Fee, MD   100 mg at 10/24/15 1610  . thiamine (VITAMIN B-1) tablet 100 mg  100 mg Oral Daily Rachael Fee, MD   100 mg at 10/29/15 8295  . zolpidem (AMBIEN)  tablet 5 mg  5 mg Oral QHS PRN Charm Rings, NP        Lab Results:  Results for orders placed or performed during the hospital encounter of 10/23/15 (from the past 48 hour(s))  Glucose, capillary     Status: Abnormal   Collection Time: 10/27/15  5:01 PM  Result Value Ref Range   Glucose-Capillary 244 (H) 65 - 99 mg/dL  Glucose, capillary     Status: Abnormal   Collection Time: 10/27/15  8:46 PM  Result Value Ref Range   Glucose-Capillary 150 (H) 65 - 99 mg/dL  Glucose, capillary     Status: Abnormal   Collection Time: 10/28/15  6:08 AM  Result Value Ref Range   Glucose-Capillary 172 (H) 65 - 99 mg/dL   Comment 1 Notify RN   Glucose, capillary     Status: Abnormal   Collection Time: 10/28/15 11:55 AM  Result Value Ref Range   Glucose-Capillary 220 (H) 65 - 99 mg/dL  Glucose, capillary     Status: Abnormal   Collection Time: 10/28/15  5:14 PM  Result Value Ref Range   Glucose-Capillary 170 (H) 65 - 99 mg/dL  Glucose, capillary     Status: Abnormal   Collection Time: 10/28/15  9:07 PM  Result Value Ref Range   Glucose-Capillary 156 (H) 65 - 99 mg/dL   Comment 1 Notify RN   Glucose, capillary     Status: Abnormal   Collection Time: 10/29/15  6:07 AM  Result Value Ref Range   Glucose-Capillary 136 (H) 65 - 99 mg/dL    Blood Alcohol level:  Lab Results  Component Value Date   ETH 31* 10/23/2015   ETH 98* 10/19/2015    Physical Findings: AIMS: Facial and Oral Movements Muscles of Facial Expression: None, normal Lips and Perioral Area: None, normal Jaw: None, normal Tongue: None, normal,Extremity Movements Upper (arms, wrists, hands, fingers): None, normal Lower (legs, knees, ankles, toes): None, normal, Trunk Movements Neck, shoulders, hips: None, normal, Overall Severity Severity of abnormal movements (highest score from questions above): None, normal Incapacitation due to abnormal movements: None, normal Patient's awareness of abnormal movements (rate only  patient's report): No Awareness, Dental Status Current problems with teeth and/or dentures?: No Does patient usually wear dentures?: No  CIWA:  CIWA-Ar Total: 0 COWS:  COWS Total Score: 1  Musculoskeletal: Strength & Muscle Tone: within normal limits Gait & Station: normal Patient leans: normal  Psychiatric Specialty Exam: Review of Systems  Constitutional: Positive for malaise/fatigue.  Psychiatric/Behavioral: Positive for depression, hallucinations and substance abuse. The patient is nervous/anxious.   All other systems reviewed and are negative.  Blood pressure 115/78, pulse 82, temperature 97.9 F (36.6 C), temperature source Oral, resp. rate 18, height  (1.676 m), weight 66.225 kg (146 lb), SpO2 100 %.Body mass index is 23.58 kg/(m^2).  General Appearance: Fairly Groomed  Patent attorney::  Good  Speech:  Clear and Coherent  Volume:  Normal  Mood:  Depressed  Affect:  Depressed and Flat  Thought Process:  Coherent and Goal Directed  Orientation:  Full (Time, Place, and Person)  Thought Content:  symptoms events worries concerns  Suicidal Thoughts:  No  Homicidal Thoughts:  No  Memory:  Immediate;   Good Recent;   Good Remote;   Good  Judgement:  Fair  Insight:  Present  Psychomotor Activity:  Normal  Concentration:  Fair  Recall:  Good  Fund of Knowledge:Fair  Language: Good  Akathisia:  No  Handed:  Right  AIMS (if indicated):     Assets:  Communication Skills Desire for Improvement  ADL's:  Intact  Cognition: WNL  Sleep:  Number of Hours: 4.75   Treatment Plan Summary: Daily contact with patient to assess and evaluate symptoms and progress in treatment and Medication management   Continue supportive approach/coping skills Alcohol dependence; continue the Ativan detox protocol/work a relapse prevention plan Depression; Continue  Lexapro 10 mg daily (May want to consider increasing to 20 mg after 1 week of tolerating medication) Insomnia: Started  Trazodone 50 mg Q hs prn; and discontinued Ambien 5 mg Q hs prn Work with CBT/mindfulness/stress management/problem solving/behavioral activation Social Work to facilitate getting to [facility] [place], [place], NP 10/29/2015, 11:33 AM  I agreed with findings and treatment plan of this patient

## 2015-10-29 NOTE — Progress Notes (Signed)
Patient did attend the evening speaker AA meeting.  

## 2015-10-29 NOTE — Progress Notes (Signed)
Patient's status remains unchanged. He continues to be irritable and refuses groups. Does not answer questions posed to him, often shrugs shoulders. Refusing self inventory for second day in a row even when encouraged and assistance is offered. Denies pain, physical problems. Patient medicated per orders. No prn's requested, given. 3 units insulin coverage given at lunch time. Emotional support offered however patient remains unreceptive. He denies SI/HI and remains safe on level III obs.

## 2015-10-29 NOTE — [facility] Group Notes (Signed)
[facility] Group Notes:  (Nursing/MHT/Case Management/Adjunct)  Date:  10/29/2015  Time:  1300  Type of Therapy:  Nurse Education - Healthy Support Systems  Participation Level:  Did Not Attend  Participation Quality:    Affect:    Cognitive:    Insight:    Engagement in Group:    Modes of Intervention:    Summary of Progress/Problems: Patient refusing groups.  Merian CapronFriedman, Jah Alarid Gran[facility] Peaks Endoscopy LLCEakes 10/29/2015, 1340

## 2015-10-29 NOTE — Progress Notes (Signed)
The patient did not attend last evening's AA meeting and elected to remain in his room.

## 2015-10-29 NOTE — Progress Notes (Signed)
D:Patient in the dayroom on approach.  Patient does not engage in conversation with Clinical research associatewriter. Patient states he should know something about his discharge plan tomorrow.  Patient denies SI/HI and denies AVH.   A: Staff to monitor Q 15 mins for safety.  Encouragement and support offered.  Scheduled medications administered per orders. R: Patient remains safe on the unit.  Patient attended group tonight.  Patient visible on the unit.  Patient taking administered medications.

## 2015-10-30 LAB — GLUCOSE, CAPILLARY
GLUCOSE-CAPILLARY: 197 mg/dL — AB (ref 65–99)
GLUCOSE-CAPILLARY: 171 mg/dL — AB (ref 65–99)
GLUCOSE-CAPILLARY: 196 mg/dL — AB (ref 65–99)
Glucose-Capillary: 200 mg/dL — ABNORMAL HIGH (ref 65–99)

## 2015-10-30 NOTE — Progress Notes (Signed)
Recreation Therapy Notes  Date: 03.27.2017 Time: 9:30am Location: [address] Group Room   Group Topic: Stress Management  Goal Area(s) Addresses:  Patient will actively participate in stress management techniques presented during session.   Behavioral Response: Did not attend.   Amillia Biffle L Aala Ransom, LRT/CTRS         Hilary Milks L 10/30/2015 12:53 PM 

## 2015-10-30 NOTE — Progress Notes (Signed)
Pt attended AA meeting this evening.  

## 2015-10-30 NOTE — [facility] Group Notes (Signed)
[facility] LCSW Group Therapy  10/30/2015 11:18 AM  Type of Therapy:  Group Therapy  Participation Level:  Did Not Attend-pt chose to remain in bed.   Modes of Intervention:  Confrontation, Discussion, Education, Exploration, Problem-solving, Rapport Building, Socialization and Support  Summary of Progress/Problems: Today's Topic: Overcoming Obstacles. Patients identified one short term goal and potential obstacles in reaching this goal. Patients processed barriers involved in overcoming these obstacles. Patients identified steps necessary for overcoming these obstacles and explored motivation (internal and external) for facing these difficulties head on.   Smart, Coriann Brouhard LCSW 10/30/2015, 11:18 AM

## 2015-10-30 NOTE — Plan of Care (Signed)
Problem: Consults Goal: Suicide Risk Patient Education (See Patient Education module for education specifics)  Outcome: Progressing Nurse discussed suicidal thoughts/ depression/ coping skills with patient.        

## 2015-10-30 NOTE — Progress Notes (Signed)
[facility] MD Progress Note  10/30/2015 6:05 PM [name] Kennedy  MRN:  [id number] Subjective:  [name] Kennedy states he is not sleeping at night and for that reason he feels tired and stays in bed. He is pushing himself today to stay out go to groups so he can sleep tonight. He states he really wants to get help for his substance abuse Principal Problem: Depression, major, recurrent, severe with psychosis (HCC) Diagnosis:   Patient Active Problem List   Diagnosis Date Noted  . Alcohol dependence with uncomplicated withdrawal (HCC) [F10.230]   . Insomnia [G47.00]   . 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 Cocaine abuse [F14.10] 10/24/2015  . Alcohol-induced mood disorder (HCC) [F10.94] 06/26/2015  . Substance induced mood disorder (HCC) [F19.94] 06/26/2015  . Uncomplicated alcohol dependence (HCC) [F10.20]   . Alcohol dependence (HCC) [F10.20] 01/24/2013  . Cocaine dependence (HCC) [F14.20] 01/24/2013  . Suicidal ideation [R45.851] 01/23/2013   Total Time spent with patient: 20 minutes  Past Psychiatric History: see admission H and P  Past Medical History:  Past Medical History  Diagnosis Date  . Suicidal ideations   . Diabetes mellitus without complication (HCC)   . Alcohol abuse    History reviewed. No pertinent past surgical history. Family History: History reviewed. No pertinent family history. Family Psychiatric  History: see admission H and P Social History:  History  Alcohol Use  . 25.2 oz/week  . 42 Cans of beer per week     History  Drug Use  . Yes  . Special: Cocaine    Comment: last use 10/22/2015     Social History   Social History  . Marital Status: Single    Spouse Name: N/A  . Number of Children: N/A  . Years of Education: N/A   Social History Main Topics  . Smoking status: Current Every Day Smoker -- 1.00 packs/day    Types: Cigarettes  . Smokeless tobacco: None  . Alcohol Use: 25.2 oz/week    42 Cans of beer per week  .  Drug Use: Yes    Special: Cocaine     Comment: last use 10/22/2015   . Sexual Activity: Yes    Birth Control/ Protection: Condom   Other Topics Concern  . None   Social History Narrative   Additional Social History:                         Sleep: Poor  Appetite:  Fair  Current Medications: Current Facility-Administered Medications  Medication Dose Route Frequency Provider Last Rate Last Dose  . acetaminophen (TYLENOL) tablet 650 mg  650 mg Oral Q4H PRN Charm Rings, NP      . alum & mag hydroxide-simeth (MAALOX/MYLANTA) 200-200-20 MG/5ML suspension 30 mL  30 mL Oral Q4H PRN Charm Rings, NP      . escitalopram (LEXAPRO) tablet 10 mg  10 mg Oral Daily Rachael Fee, MD   10 mg at 10/30/15 0900  . ibuprofen (ADVIL,MOTRIN) tablet 600 mg  600 mg Oral Q8H PRN Charm Rings, NP      . insulin aspart (novoLOG) injection 0-15 Units  0-15 Units Subcutaneous TID WC Charm Rings, NP   3 Units at 10/30/15 1722  . insulin aspart (novoLOG) injection 0-5 Units  0-5 Units Subcutaneous QHS Charm Rings, NP   4 Units at 10/29/15 2216  . insulin glargine (LANTUS) injection 15 Units  15 Units Subcutaneous QHS Catha Nottingham  Kennyth Lose, NP   15 Units at 10/29/15 2217  . magnesium hydroxide (MILK OF MAGNESIA) suspension 30 mL  30 mL Oral Daily PRN Charm Rings, NP      . multivitamin with minerals tablet 1 tablet  1 tablet Oral Daily Rachael Fee, MD   1 tablet at 10/30/15 0900  . ondansetron (ZOFRAN) tablet 4 mg  4 mg Oral Q8H PRN Charm Rings, NP      . thiamine (B-1) injection 100 mg  100 mg Intramuscular Once Rachael Fee, MD   100 mg at 10/24/15 1610  . thiamine (VITAMIN B-1) tablet 100 mg  100 mg Oral Daily Rachael Fee, MD   100 mg at 10/30/15 0900  . traZODone (DESYREL) tablet 50 mg  50 mg Oral QHS Shuvon B Rankin, NP   50 mg at 10/29/15 2200    Lab Results:  Results for orders placed or performed during the hospital encounter of 10/23/15 (from the past 48 hour(s))  Glucose,  capillary     Status: Abnormal   Collection Time: 10/28/15  9:07 PM  Result Value Ref Range   Glucose-Capillary 156 (H) 65 - 99 mg/dL   Comment 1 Notify RN   Glucose, capillary     Status: Abnormal   Collection Time: 10/29/15  6:07 AM  Result Value Ref Range   Glucose-Capillary 136 (H) 65 - 99 mg/dL  Glucose, capillary     Status: Abnormal   Collection Time: 10/29/15 11:37 AM  Result Value Ref Range   Glucose-Capillary 233 (H) 65 - 99 mg/dL   Comment 1 Notify RN   Glucose, capillary     Status: Abnormal   Collection Time: 10/29/15  4:36 PM  Result Value Ref Range   Glucose-Capillary 143 (H) 65 - 99 mg/dL   Comment 1 Notify RN    Comment 2 Document in Chart   Glucose, capillary     Status: Abnormal   Collection Time: 10/29/15  9:06 PM  Result Value Ref Range   Glucose-Capillary 307 (H) 65 - 99 mg/dL   Comment 1 Notify RN    Comment 2 Document in Chart   Glucose, capillary     Status: Abnormal   Collection Time: 10/30/15  6:10 AM  Result Value Ref Range   Glucose-Capillary 197 (H) 65 - 99 mg/dL   Comment 1 Notify RN    Comment 2 Document in Chart   Glucose, capillary     Status: Abnormal   Collection Time: 10/30/15 11:15 AM  Result Value Ref Range   Glucose-Capillary 171 (H) 65 - 99 mg/dL   Comment 1 Notify RN    Comment 2 Document in Chart   Glucose, capillary     Status: Abnormal   Collection Time: 10/30/15  4:45 PM  Result Value Ref Range   Glucose-Capillary 200 (H) 65 - 99 mg/dL   Comment 1 Notify RN    Comment 2 Document in Chart     Blood Alcohol level:  Lab Results  Component Value Date   ETH 31* 10/23/2015   ETH 98* 10/19/2015    Physical Findings: AIMS: Facial and Oral Movements Muscles of Facial Expression: None, normal Lips and Perioral Area: None, normal Jaw: None, normal Tongue: None, normal,Extremity Movements Upper (arms, wrists, hands, fingers): None, normal Lower (legs, knees, ankles, toes): None, normal, Trunk Movements Neck, shoulders,  hips: None, normal, Overall Severity Severity of abnormal movements (highest score from questions above): None, normal Incapacitation due to abnormal movements:  None, normal Patient's awareness of abnormal movements (rate only patient's report): No Awareness, Dental Status Current problems with teeth and/or dentures?: No Does patient usually wear dentures?: No  CIWA:  CIWA-Ar Total: 1 COWS:  COWS Total Score: 1  Musculoskeletal: Strength & Muscle Tone: within normal limits Gait & Station: normal Patient leans: normal  Psychiatric Specialty Exam: Review of Systems  Constitutional: Positive for malaise/fatigue.  HENT: Negative.   Eyes: Negative.   Respiratory: Negative.   Cardiovascular: Negative.   Gastrointestinal: Negative.   Genitourinary: Negative.   Musculoskeletal: Negative.   Skin: Negative.   Neurological: Negative.   Endo/Heme/Allergies: Negative.   Psychiatric/Behavioral: Positive for depression and substance abuse. The patient is nervous/anxious and has insomnia.     Blood pressure 132/65, pulse 80, temperature 98.4 F (36.9 C), temperature source Oral, resp. rate 16, height 5\' 6"  (1.676 m), weight 66.225 kg (146 lb), SpO2 100 %.Body mass index is 23.58 kg/(m^2).  General Appearance: Fairly Groomed  Patent attorneyye Contact::  Fair  Speech:  Clear and Coherent and Slow  Volume:  Decreased  Mood:  Anxious and Depressed  Affect:  Restricted  Thought Process:  Coherent and Goal Directed  Orientation:  Full (Time, Place, and Person)  Thought Content:  symptoms events worries concerns  Suicidal Thoughts:  No  Homicidal Thoughts:  No  Memory:  Immediate;   Fair Recent;   Fair Remote;   Fair  Judgement:  Fair  Insight:  Present and Shallow  Psychomotor Activity:  Restlessness  Concentration:  Fair  Recall:  FiservFair  Fund of Knowledge:Fair  Language: Fair  Akathisia:  No  Handed:  Right  AIMS (if indicated):     Assets:  Desire for Improvement  ADL's:  Intact  Cognition:  WNL  Sleep:  Number of Hours: 4.75   Treatment Plan Summary: Daily contact with patient to assess and evaluate symptoms and progress in treatment and Medication management  Supportive approach/coping skills Depression; continue the Lexapro 10 mg daily Alcohol cocaine abuse-dependence; continue work a relapse prevention plan Insomnia; increase the Trazodone to 100 mg HS Work with CBT/mindfulness Explore residential treatment options Ariannie Penaloza A, MD 10/30/2015, 6:05 PM

## 2015-10-30 NOTE — Progress Notes (Signed)
D:  Patient's self inventory sheet, patient has poor sleep, no sleep medication given.  Poor appetite, low energy level, poor concentration.  Rated depression and anxiety 10, hopeless 8.  Denied withdrawals.  Stated he does feel anxious.  Denied SI.  Denied pain.  Goal is to feel better.  No discharge plans. A:  Medications administered per MD orders.  Emotional support and encouragement given patient. R:  Denied SI and HI, contracts for safety.  Denied A/V hallucinations.  Safety maintained with 15 minute checks.

## 2015-10-30 NOTE — [facility] Group Notes (Signed)
[facility] Note   10/30/2015 10:47 AM  Participation Quality:  Invited. DID NOT ATTEND. Pt chose to remain in bed.   Smart, Shamieka Gullo LCSW

## 2015-10-31 LAB — GLUCOSE, CAPILLARY
GLUCOSE-CAPILLARY: 153 mg/dL — AB (ref 65–99)
GLUCOSE-CAPILLARY: 234 mg/dL — AB (ref 65–99)
Glucose-Capillary: 204 mg/dL — ABNORMAL HIGH (ref 65–99)
Glucose-Capillary: 248 mg/dL — ABNORMAL HIGH (ref 65–99)

## 2015-10-31 MED ORDER — TRAZODONE HCL 50 MG PO TABS: 50.0000 mg | ORAL_TABLET | Freq: Every day | ORAL | Status: DC

## 2015-10-31 MED ORDER — INSULIN GLARGINE 100 UNIT/ML ~~LOC~~ SOLN: 15.0000 [IU] | Freq: Every day | SUBCUTANEOUS | Status: DC

## 2015-10-31 MED ORDER — ESCITALOPRAM OXALATE 10 MG PO TABS: 10.0000 mg | ORAL_TABLET | Freq: Every day | ORAL | Status: DC

## 2015-10-31 NOTE — Progress Notes (Signed)
Pt attended AA meeting this evening.  

## 2015-10-31 NOTE — Progress Notes (Signed)
  [facility] Discharge [facility] :  Will you be returning to the same living situation after discharge:  No-[facility]  At discharge, do you have transportation home?: Yes,  bus pass in chart Do you have the ability to pay for your medications: Yes,  medication management  Release of information consent forms completed and submitted to medical records by CSW.  Patient to Follow up at: Follow-up Information    Follow up with [facility].   Why:  Walk in between 8am-9am Monday through Friday for hospital follow-up/medication management/possible ACT referral/assessment for counseling services.    Contact information:   [address] [place], [place] [address] Phone: [phone] Fax: [phone]      Next level of care provider has access to [facility] Link:no  Safety Planning and Suicide Prevention discussed: Yes,  SPE completed with pt, as he declined to consent to family contact. SPI pamphlet and mobile crisis information provided to pt. He was encouraged to share this information with his support network.   Have you used any form of tobacco in the last 30 days? (Cigarettes, Smokeless Tobacco, Cigars, and/or Pipes): Yes  Has patient been referred to the Quitline?: Patient refused referral  Patient has been referred for addiction treatment: Yes  Smart, Jimy Gates  LCSW  10/31/2015, 3:19 PM

## 2015-10-31 NOTE — Progress Notes (Signed)
  [facility] :  Will you be returning to the same living situation after discharge:  No-[facility].  At discharge, do you have transportation home?: Yes,  bus pass in chart-pt plans to discharge by 9:00AM [facility]  Do you have the ability to pay for your medications: Yes,  medication management  Release of information consent forms completed and submitted to medical records by CSW.  Patient to Follow up at: Follow-up Information    Follow up with [facility].   Why:  Walk in between 8am-9am Monday through Friday for hospital follow-up/medication management/possible ACT referral/assessment for counseling services.    Contact information:   [address] [place], [place] [address] Phone: [phone] Fax: [phone]      Next level of care provider has access to [facility] Link:no  Safety Planning and Suicide Prevention discussed: Yes,  SPE completed with pt, as he declined to consent to family contact. SPI pamphlet and mobile crisis information provided to pt. He was encouraged to share this information with his support network.   Have you used any form of tobacco in the last 30 days? (Cigarettes, Smokeless Tobacco, Cigars, and/or Pipes): Yes  Has patient been referred to the Quitline?: Patient refused referral  Patient has been referred for addiction treatment: Yes  Smart, Tatyanna Cronk  LCSW  10/31/2015, 3:24 PM

## 2015-10-31 NOTE — Discharge Summary (Signed)
Physician Discharge Summary Note  Patient:  [name] Kennedy is an 50 y.o., male MRN:  [id number] DOB:  Oct 29, 1965 Patient phone:  [phone] (home)  Patient address:   [place] [address],   Total Time spent with patient: 30 minutes  Date of Admission:  10/23/2015  Date of Discharge: 10/30/2015  Reason for Admission: Alcohol detoxification/worsening symptoms of depression.  Principal Problem: Depression, major, recurrent, severe with psychosis [name])  Discharge Diagnoses: Patient Active Problem List   Diagnosis Date Noted  . Alcohol dependence (HCC) [F10.20] 01/24/2013    Priority: High  . Cocaine dependence (HCC) [F14.20] 01/24/2013    Priority: Medium  . Alcohol dependence with uncomplicated withdrawal (HCC) [F10.230]   . Insomnia [G47.00]   . 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 Cocaine abuse [F14.10] 10/24/2015  . Alcohol-induced mood disorder (HCC) [F10.94] 06/26/2015  . Substance induced mood disorder (HCC) [F19.94] 06/26/2015  . Uncomplicated alcohol dependence (HCC) [F10.20]   . Suicidal ideation [R45.851] 01/23/2013   Past Psychiatric History: Alcohol included mood disorder  Past Medical History:  Past Medical History  Diagnosis Date  . Suicidal ideations   . Diabetes mellitus without complication (HCC)   . Alcohol abuse    History reviewed. No pertinent past surgical history.  Family History: History reviewed. No pertinent family history.  Family Psychiatric  History: Denies family psych history  Social History:  History  Alcohol Use  . 25.2 oz/week  . 42 Cans of beer per week     History  Drug Use  . Yes  . Special: Cocaine    Comment: last use 10/22/2015     Social History   Social History  . Marital Status: Single    Spouse Name: N/A  . Number of Children: N/A  . Years of Education: N/A   Social History Main Topics  . Smoking status: Current Every Day Smoker -- 1.00 packs/day   Types: Cigarettes  . Smokeless tobacco: None  . Alcohol Use: 25.2 oz/week    42 Cans of beer per week  . Drug Use: Yes    Special: Cocaine     Comment: last use 10/22/2015   . Sexual Activity: Yes    Birth Control/ Protection: Condom   Other Topics Concern  . None   Social History Narrative   Hospital Course: 50 Y/O male who states he has been homeless for 3 years. States she drinks uses cocaine. States he has learning disabilities and he is technically Limited. He States he is trying to get help "hope to get some money from the government so I can get a place to live" Drinking alcohol as much as "I can stand" I grew up in a bootleg[place] has been drinking beer since 11-12 Y/O. Has had a year or so without alcohol and he felt better. He uses cocaine every now and then. States he gets depressed and hears voices that are derogatory in nature.  [name] Kennedy was admitted to the hospital with a BAL of 31 per toxicology tests results. He admits having been drinking a lot of alcohol as he considered himself growing up in a bootleg[place]. He admitted using cocaine as well. He endorsed derogatory auditory hallucinations as well as getting depressed from time to time. [name] Kennedy was here for alcohol detoxification/depression treatments. His detoxification treatment was achieved using Ativan detox regimen on a tapering dose format.  He was enrolled in the group counseling sessions, AA/NA meetings being offered and held on this unit. He participated  and learned coping skills that should help him cope better & maintain sobriety after discharge. [name] Kennedy also presented other pre-existing medical issues that required treatment & or monitoring. He was resumed on all his pertinent home medications for those health issues. He tolerated his treatment regimen without any significant adverse effects and or reactions reported.  Besides the detoxification treatments, [name] Kennedy was also medicated & discharged on; Lexapro 10 mg for depression &  Trazodone 50 mg for insomnia. He has completed detox treatment & his mood is stable. This is evidenced by his reports of improved mood & absence of substance withdrawal symptoms. He is currently being discharged to continue  psychiatric care & routine medication [facility] [place]. He was provided with all the pertinent information required to make this appointment without problems. He was encouraged to join/attend AA/NA meetings being offered & held within his community to achieve & maintain maximum sobriety.   [name] Kennedy is currently mentally & medically stable to be discharged to continue treatment on outpatient basis. Upon discharge, he adamantly denies any suicidal, homicidal ideations, auditory, visual hallucinations, delusional thoughts, paranoia & or substance withdrawal symptoms. [facility] in no apparent distress. He received a 14 days worth supply samples of his [facility]. Transportation per city bus. [facility].  Physical Findings: AIMS: Facial and Oral Movements Muscles of Facial Expression: None, normal Lips and Perioral Area: None, normal Jaw: None, normal Tongue: None, normal,Extremity Movements Upper (arms, wrists, hands, fingers): None, normal Lower (legs, knees, ankles, toes): None, normal, Trunk Movements Neck, shoulders, hips: None, normal, Overall Severity Severity of abnormal movements (highest score from questions above): None, normal Incapacitation due to abnormal movements: None, normal Patient's awareness of abnormal movements (rate only patient's report): No Awareness, Dental Status Current problems with teeth and/or dentures?: No Does patient usually wear dentures?: No  CIWA:  CIWA-Ar Total: 1 COWS:  COWS Total Score: 1  Musculoskeletal: Strength & Muscle Tone: within normal limits Gait & Station: normal Patient leans: N/A  Psychiatric Specialty  Exam: Review of Systems  Constitutional: Negative.   HENT: Negative.   Eyes: Negative.   Respiratory: Negative.   Cardiovascular: Negative.   Gastrointestinal: Negative.   Genitourinary: Negative.   Musculoskeletal: Negative.   Skin: Negative.   Endo/Heme/Allergies: Negative.   Psychiatric/Behavioral: Positive for depression (StableStable) and substance abuse (Alcohol/cocaine dependence). Negative for suicidal ideas, hallucinations and memory loss. The patient has insomnia (Stable). The patient is not nervous/anxious.   All other systems reviewed and are negative.   Blood pressure 112/69, pulse 76, temperature 97.6 F (36.4 C), temperature source Oral, resp. rate 17, height 5\' 6"  (1.676 m), weight 66.225 kg (146 lb), SpO2 100 %.Body mass index is 23.58 kg/(m^2).  See Md's SRA  Have you used any form of tobacco in the last 30 days? (Cigarettes, Smokeless Tobacco, Cigars, and/or Pipes): Yes  Has this patient used any form of tobacco in the last 30 days? (Cigarettes, Smokeless Tobacco, Cigars, and/or Pipes): No  Blood Alcohol level:  Lab Results  Component Value Date   ETH 31* 10/23/2015   ETH 98* 10/19/2015   Metabolic Disorder Labs:  No results found for: HGBA1C, MPG No results found for: PROLACTIN No results found for: CHOL, TRIG, HDL, CHOLHDL, VLDL, LDLCALC  See Psychiatric Specialty Exam and Suicide Risk Assessment completed by Attending Physician prior to discharge.  Discharge destination:  Other:  [facility]  Is patient on  multiple antipsychotic therapies at discharge:  No   Has Patient had three or more failed trials of antipsychotic monotherapy by history:  No  Recommended Plan for Multiple Antipsychotic Therapies: NA     Medication List    STOP taking these medications        hydrOXYzine 25 MG tablet  Commonly known as:  ATARAX/VISTARIL      TAKE these medications      Indication   escitalopram 10 MG tablet  Commonly known as:  LEXAPRO  Take 1 tablet (10 mg  total) by mouth daily. For depression   Indication:  Major Depressive Disorder     insulin glargine 100 UNIT/ML injection  Commonly known as:  LANTUS  Inject 0.15 mLs (15 Units total) into the skin at bedtime. For diabetes management   Indication:  Type 2 Diabetes     traZODone 50 MG tablet  Commonly known as:  DESYREL  Take 1 tablet (50 mg total) by mouth at bedtime. For sleep   Indication:  Trouble Sleeping       Follow-up Information    Follow up with [facility].   Why:  Walk in between 8am-9am Monday through Friday for hospital follow-up/medication management/possible ACT referral/assessment for counseling services.    Contact information:   [address], [place] [address] Phone: [phone] Fax: [phone]     Follow-up recommendations:  Activity:  As tolerated Diet: As recommended by your primary care doctor. Keep all scheduled follow-up appointments as recommended.  Comments: Take all your medications as prescribed by your mental healthcare provider. Report any adverse effects and or reactions from your medicines to your outpatient provider promptly. Patient is instructed and cautioned to not engage in alcohol and or illegal drug use while on prescription medicines. In the event of worsening symptoms, patient is instructed to call the crisis hotline, 911 and or go to the nearest ED for appropriate evaluation and treatment of symptoms. Follow-up with your primary care provider for your other medical issues, concerns and or health care needs.   Signed: Sanjuana Kava, NP, PMHNP-BC 10/31/2015, 3:19 PM   I personally assessed the patient and formulated the plan Madie Reno A. Dub Mikes, M.D.

## 2015-10-31 NOTE — Progress Notes (Signed)
D: Pt presents flat in affect and depressed in mood. Pt was guarded in Conservation officer, natureinteraction writer. Pt denied any SI/HI/AVH. Pt attended AA. Pt observed watching tv with his peers in the dayroom. Pt had no concerns he wished for this writer to address at this time. Pt declined to take his sleep aid.  A: Writer administered scheduled medications to pt, per MD orders. Continued support and availability as needed was extended to this pt. Staff continues to monitor pt with q3415min checks.  R: No adverse drug reactions noted. Pt receptive to treatment. Pt remains safe at this time.

## 2015-10-31 NOTE — Progress Notes (Addendum)
Patient ID: [name] FlemingsOscar Kennedy, male   DOB: 11/22/1965, 50 y.o.   MRN: [id number] D: Client visible on the unit, reports "I'm going home tomorrow" "been here eight days" "goal meet and I'm moving on, going to treatment" A:Writer provided emotional support, commended client on continuing treatment. Staff will monitor q4015min for treatment. R: Client is safe on the unit, attended group.

## 2015-10-31 NOTE — BHH Group Notes (Signed)
The focus of this group is to educate the patient on the purpose and policies of crisis stabilization and provide a format to answer questions about their admission.  The group details unit policies and expectations of patients while admitted.  Patient did not attend 0900 nurse education orientation group this morning.  Patient stayed in bed.   

## 2015-10-31 NOTE — BHH Suicide Risk Assessment (Signed)
[facility]   Principal Problem: Depression, major, recurrent, severe with psychosis [facility](HCC) Discharge Diagnoses:  Patient Active Problem List   Diagnosis Date Noted  . Alcohol dependence with uncomplicated withdrawal (HCC) [F10.230]   . Insomnia [G47.00]   . 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 Cocaine abuse [F14.10] 10/24/2015  . Alcohol-induced mood disorder (HCC) [F10.94] 06/26/2015  . Substance induced mood disorder (HCC) [F19.94] 06/26/2015  . Uncomplicated alcohol dependence (HCC) [F10.20]   . Alcohol dependence (HCC) [F10.20] 01/24/2013  . Cocaine dependence (HCC) [F14.20] 01/24/2013  . Suicidal ideation [R45.851] 01/23/2013    Total Time spent with patient: 20 minutes  Musculoskeletal: Strength & Muscle Tone: within normal limits Gait & Station: normal Patient leans: normal  Psychiatric Specialty Exam: Review of Systems  Constitutional: Negative.   HENT: Negative.   Eyes: Negative.   Respiratory: Negative.   Cardiovascular: Negative.   Gastrointestinal: Negative.   Genitourinary: Negative.   Musculoskeletal: Negative.   Skin: Negative.   Neurological: Negative.   Endo/Heme/Allergies: Negative.   Psychiatric/Behavioral: Positive for depression and substance abuse.    Blood pressure 112/69, pulse 76, temperature 97.6 F (36.4 C), temperature source Oral, resp. rate 17, height 5\' 6"  (1.676 m), weight 66.225 kg (146 lb), SpO2 100 %.Body mass index is 23.58 kg/(m^2).  General Appearance: Fairly Groomed  Patent attorneyye Contact::  Fair  Speech:  Clear and Coherent409  Volume:  Decreased  Mood:  Euthymic  Affect:  Restricted  Thought Process:  Coherent and Goal Directed  Orientation:  Full (Time, Place, and Person)  Thought Content:  plans as he moves on, relapse prevention plan  Suicidal Thoughts:  No  Homicidal Thoughts:  No  Memory:  Immediate;   Fair Recent;   Fair Remote;   Fair  Judgement:  Fair   Insight:  Present and Shallow  Psychomotor Activity:  Normal  Concentration:  Fair  Recall:  Fis[employer]Fair  Fund of Knowledge:Fair  Language: Fair  Akathisia:  No  Handed:  Right  AIMS (if indicated):     Assets:  Desire for Improvement Social Support  Sleep:  Number of Hours: 6.25  Cognition: WNL  ADL's:  intact  In full contact with reality. There are no active S/S of withdrawal. He is excited about the possibility of having a stable place to live Mental Status Per Nursing Assessment::   On Admission:     Demographic Factors:  Male  Loss Factors: none identified  Historical Factors: None identified  Risk Reduction Factors:   committment to do better  Continued Clinical Symptoms:  Depression:   Comorbid alcohol abuse/dependence Alcohol/Substance Abuse/Dependencies  Cognitive Features That Contribute To Risk:  None    Suicide Risk:  Minimal: No identifiable suicidal ideation.  Patients presenting with no risk factors but with morbid ruminations; may be classified as minimal risk based on the severity of the depressive symptoms  Follow-up Information    Follow up with [facility].   Why:  Walk in between 8am-9am Monday through Friday for hospital follow-up/medication management/possible ACT referral/assessment for counseling services.    Contact information:   [address] [place], [place] [address] Phone: [phone] Fax: [phone]      Plan Of Care/Follow-up recommendations:  Activity:  as tolerated Diet:  regular Follow up as above Fount Bahe A, MD 10/31/2015, 6:39 PM

## 2015-10-31 NOTE — Plan of Care (Signed)
Problem: Diagnosis: Increased Risk For Suicide Attempt Goal: STG-Patient Will Attend All Groups On The Unit Outcome: Progressing Pt attended AA on 10/30/15.         

## 2015-10-31 NOTE — Progress Notes (Signed)
[facility], pt is insulin dependant and therefore, [facility]. Pt made aware. [facility] and is currently meeting with [facility] social workers on the unit for initial assessment. [facility] and has been given [facility] info/[facility] program info, and [employer] pamphlet.   Trula SladeHeather Smart, MSW, LCSW Clinical Social Worker 10/31/2015 3:19 PM

## 2015-10-31 NOTE — Progress Notes (Signed)
D:  Patient's self inventory sheet, patient has poor sleep, no sleep medication given.  Good appetite, low energy level, poor concentration.  Rated depression and hopeless 5, anxiety 3.  Denied withdrawals.  Wrote that he had cramping.  Denied SI.  Physical problems, pain, worst pain in past 24 hours is #7, feet.  No pain medication.  Goal is to find somewhere to go.  Plans to talk to SW.   No discharge plans. A:  Medications administered per MD orders.  Emotional support and encouragement given patient. R:  Denied SI and HI, contracts for safety.  Denied A/V hallucinations.  Safety maintained with 15 minute checks.

## 2015-10-31 NOTE — BHH Group Notes (Signed)
BHH LCSW Group Therapy  10/31/2015 1:29 PM  Type of Therapy:  Group Therapy  Participation Level:  Active  Participation Quality:  Attentive  Affect:   Depressed/Flat  Cognitive:  Alert  Insight:  Improving  Engagement in Therapy:  Improving  Modes of Intervention:  Confrontation, Discussion, Education, Problem-solving, Rapport Building, Socialization and Support  Summary of Progress/Problems: MHA Speaker came to talk about his personal journey with substance abuse and addiction. The pt processed ways by which to relate to the speaker. MHA speaker provided handouts and educational information pertaining to groups and services offered by the [facility].   Smart, Elmond Poehlman LCSW 10/31/2015, 1:29 PM

## 2015-10-31 NOTE — Progress Notes (Signed)
Patient ID: [name] Kennedy, male   DOB: Dec 06, 1965, 50 y.o.   MRN: [id number] [facility] MD Progress Note  10/31/2015 6:32 PM [name] Kennedy  MRN:  [id number] Subjective:  He states he really wants to get help for his substance abuse. He met with the ACT from [facility] and they are going to help him. He will still want to try a residential treatment program, but he will be ready to work with an outpatient program if he could find a stable place Principal Problem: Depression, major, recurrent, severe with psychosis ([place]) Diagnosis:   Patient Active Problem List   Diagnosis Date Noted  . Alcohol dependence with uncomplicated withdrawal ([place]) [F10.230]   . Insomnia [G47.00]   . Depression, major, recurrent, severe with psychosis ([place]) [F33.3] 10/24/2015  . Cocaine abuse [F14.10] 10/24/2015  . Alcohol-induced mood disorder ([place]) [F10.94] 06/26/2015  . Substance induced mood disorder ([place]) [F19.94] 06/26/2015  . Uncomplicated alcohol dependence ([place]) [F10.20]   . Alcohol dependence ([place]) [F10.20] 01/24/2013  . Cocaine dependence ([place]) [F14.20] 01/24/2013  . Suicidal ideation [R45.851] 01/23/2013   Total Time spent with patient: 20 minutes  Past Psychiatric History: see admission H and P  Past Medical History:  Past Medical History  Diagnosis Date  . Suicidal ideations   . Diabetes mellitus without complication ([place])   . Alcohol abuse    History reviewed. No pertinent past surgical history. Family History: History reviewed. No pertinent family history. Family Psychiatric  History: see admission H and P Social History:  History  Alcohol Use  . 25.2 oz/week  . 42 Cans of beer per week     History  Drug Use  . Yes  . Special: Cocaine    Comment: last use 10/22/2015     Social History   Social History  . Marital Status: Single    Spouse Name: N/A  . Number of Children: N/A  . Years of Education: N/A   Social History Main Topics  . Smoking status: Current Every Day Smoker -- 1.00 packs/day     Types: Cigarettes  . Smokeless tobacco: None  . Alcohol Use: 25.2 oz/week    42 Cans of beer per week  . Drug Use: Yes    Special: Cocaine     Comment: last use 10/22/2015   . Sexual Activity: Yes    Birth Control/ Protection: Condom   Other Topics Concern  . None   Social History Narrative   Additional Social History:                         Sleep: Poor  Appetite:  Fair  Current Medications: Current Facility-Administered Medications  Medication Dose Route Frequency Provider Last Rate Last Dose  . acetaminophen (TYLENOL) tablet 650 mg  650 mg Oral Q4H PRN Patrecia Pour, NP      . alum & mag hydroxide-simeth (MAALOX/MYLANTA) 200-200-20 MG/5ML suspension 30 mL  30 mL Oral Q4H PRN Patrecia Pour, NP      . escitalopram (LEXAPRO) tablet 10 mg  10 mg Oral Daily Nicholaus Bloom, MD   10 mg at 10/31/15 0855  . ibuprofen (ADVIL,MOTRIN) tablet 600 mg  600 mg Oral Q8H PRN Patrecia Pour, NP      . insulin aspart (novoLOG) injection 0-15 Units  0-15 Units Subcutaneous TID WC Patrecia Pour, NP   5 Units at 10/31/15 1710  . insulin aspart (novoLOG) injection 0-5 Units  0-5 Units Subcutaneous QHS Patrecia Pour,  NP   4 Units at 10/29/15 2216  . insulin glargine (LANTUS) injection 15 Units  15 Units Subcutaneous QHS Patrecia Pour, NP   15 Units at 10/30/15 2211  . magnesium hydroxide (MILK OF MAGNESIA) suspension 30 mL  30 mL Oral Daily PRN Patrecia Pour, NP      . multivitamin with minerals tablet 1 tablet  1 tablet Oral Daily Nicholaus Bloom, MD   1 tablet at 10/31/15 [phone]  . ondansetron (ZOFRAN) tablet 4 mg  4 mg Oral Q8H PRN Patrecia Pour, NP      . thiamine (B-1) injection 100 mg  100 mg Intramuscular Once Nicholaus Bloom, MD   100 mg at 10/24/15 1610  . thiamine (VITAMIN B-1) tablet 100 mg  100 mg Oral Daily Nicholaus Bloom, MD   100 mg at 10/31/15 0855  . traZODone (DESYREL) tablet 50 mg  50 mg Oral QHS Shuvon B Rankin, NP   50 mg at 10/29/15 2200    Lab Results:  Results  for orders placed or performed during the hospital encounter of 10/23/15 (from the past 48 hour(s))  Glucose, capillary     Status: Abnormal   Collection Time: 10/29/15  9:06 PM  Result Value Ref Range   Glucose-Capillary 307 (H) 65 - 99 mg/dL   Comment 1 Notify RN    Comment 2 Document in Chart   Glucose, capillary     Status: Abnormal   Collection Time: 10/30/15  6:10 AM  Result Value Ref Range   Glucose-Capillary 197 (H) 65 - 99 mg/dL   Comment 1 Notify RN    Comment 2 Document in Chart   Glucose, capillary     Status: Abnormal   Collection Time: 10/30/15 11:15 AM  Result Value Ref Range   Glucose-Capillary 171 (H) 65 - 99 mg/dL   Comment 1 Notify RN    Comment 2 Document in Chart   Glucose, capillary     Status: Abnormal   Collection Time: 10/30/15  4:45 PM  Result Value Ref Range   Glucose-Capillary 200 (H) 65 - 99 mg/dL   Comment 1 Notify RN    Comment 2 Document in Chart   Glucose, capillary     Status: Abnormal   Collection Time: 10/30/15  9:09 PM  Result Value Ref Range   Glucose-Capillary 196 (H) 65 - 99 mg/dL   Comment 1 Notify RN   Glucose, capillary     Status: Abnormal   Collection Time: 10/31/15  6:41 AM  Result Value Ref Range   Glucose-Capillary 153 (H) 65 - 99 mg/dL  Glucose, capillary     Status: Abnormal   Collection Time: 10/31/15 11:34 AM  Result Value Ref Range   Glucose-Capillary 204 (H) 65 - 99 mg/dL   Comment 1 Notify RN    Comment 2 Document in Chart   Glucose, capillary     Status: Abnormal   Collection Time: 10/31/15  4:55 PM  Result Value Ref Range   Glucose-Capillary 248 (H) 65 - 99 mg/dL   Comment 1 Notify RN    Comment 2 Document in Chart     Blood Alcohol level:  Lab Results  Component Value Date   ETH 31* 10/23/2015   ETH 98* 10/19/2015    Physical Findings: AIMS: Facial and Oral Movements Muscles of Facial Expression: None, normal Lips and Perioral Area: None, normal Jaw: None, normal Tongue: None, normal,Extremity  Movements Upper (arms, wrists, hands, fingers): None, normal Lower (legs,  knees, ankles, toes): None, normal, Trunk Movements Neck, shoulders, hips: None, normal, Overall Severity Severity of abnormal movements (highest score from questions above): None, normal Incapacitation due to abnormal movements: None, normal Patient's awareness of abnormal movements (rate only patient's report): No Awareness, Dental Status Current problems with teeth and/or dentures?: No Does patient usually wear dentures?: No  CIWA:  CIWA-Ar Total: 1 COWS:  COWS Total Score: 1  Musculoskeletal: Strength & Muscle Tone: within normal limits Gait & Station: normal Patient leans: normal  Psychiatric Specialty Exam: Review of Systems  Constitutional: Positive for malaise/fatigue.  HENT: Negative.   Eyes: Negative.   Respiratory: Negative.   Cardiovascular: Negative.   Gastrointestinal: Negative.   Genitourinary: Negative.   Musculoskeletal: Negative.   Skin: Negative.   Neurological: Negative.   Endo/Heme/Allergies: Negative.   Psychiatric/Behavioral: Positive for depression and substance abuse. The patient is nervous/anxious and has insomnia.     Blood pressure 112/69, pulse 76, temperature 97.6 F (36.4 C), temperature source Oral, resp. rate 17, height 5' 6"  (1.676 m), weight 66.225 kg (146 lb), SpO2 100 %.Body mass index is 23.58 kg/(m^2).  General Appearance: Fairly Groomed  Engineer, water::  Fair  Speech:  Clear and Coherent and Slow  Volume:  Decreased  Mood:  Anxious and Depressed  Affect:  Restricted  Thought Process:  Coherent and Goal Directed  Orientation:  Full (Time, Place, and Person)  Thought Content:  symptoms events worries concerns  Suicidal Thoughts:  No  Homicidal Thoughts:  No  Memory:  Immediate;   Fair Recent;   Fair Remote;   Fair  Judgement:  Fair  Insight:  Present and Shallow  Psychomotor Activity:  Restlessness  Concentration:  Fair  Recall:  [employer] of  Knowledge:Fair  Language: Fair  Akathisia:  No  Handed:  Right  AIMS (if indicated):     Assets:  Desire for Improvement  ADL's:  Intact  Cognition: WNL  Sleep:  Number of Hours: 6.25   Treatment Plan Summary: Daily contact with patient to assess and evaluate symptoms and progress in treatment and Medication management  Supportive approach/coping skills Depression; continue the Lexapro 10 mg daily Alcohol cocaine abuse-dependence; continue work a relapse prevention plan Insomnia; increase the Trazodone to 100 mg HS Work with CBT/mindfulness Explore residential treatment options Will meet with the ACT from [facility] and will explore more permanent residential treatments with them [place], MD 10/31/2015, 6:32 PM

## 2015-10-31 NOTE — Plan of Care (Signed)
Problem: Consults Goal: Substance Abuse Patient Education See Patient Education Module for education specifics.  Outcome: Progressing Nurse discussed depression/substance abuse/coping skills with patient.

## 2015-11-01 LAB — GLUCOSE, CAPILLARY: GLUCOSE-CAPILLARY: 218 mg/dL — AB (ref 65–99)

## 2015-11-01 NOTE — Progress Notes (Signed)
Patient ID: [name] FlemingsOscar Schlee, male   DOB: 08/27/1965, 50 y.o.   MRN: [id number] Client pleasant, denies SHI. Discharge instructions reviewed. Client belongings reviewed and checked with client. Client ate breakfast, discharged.

## 2015-12-12 ENCOUNTER — Emergency Department (HOSPITAL_COMMUNITY)
Admission: EM | Admit: 2015-12-12 | Discharge: 2015-12-12 | Disposition: A | Discharge status: Other Acute Inpatient Hospital | Payer: Self-pay | Attending: Emergency Medicine | Admitting: Emergency Medicine

## 2015-12-12 ENCOUNTER — Encounter (HOSPITAL_COMMUNITY): Payer: Self-pay | Admitting: Emergency Medicine

## 2015-12-12 ENCOUNTER — Encounter (HOSPITAL_COMMUNITY): Payer: Self-pay

## 2015-12-12 ENCOUNTER — Observation Stay (HOSPITAL_COMMUNITY)
Admission: AD | Admit: 2015-12-12 | Discharge: 2015-12-13 | Disposition: A | Discharge status: Home / Self Care | Payer: No Typology Code available for payment source | Source: Intra-hospital | Attending: Psychiatry | Admitting: Psychiatry

## 2015-12-12 DIAGNOSIS — F1494 Cocaine use, unspecified with cocaine-induced mood disorder: Principal | ICD-10-CM | POA: Diagnosis present

## 2015-12-12 DIAGNOSIS — F1024 Alcohol dependence with alcohol-induced mood disorder: Secondary | ICD-10-CM | POA: Insufficient documentation

## 2015-12-12 DIAGNOSIS — F1420 Cocaine dependence, uncomplicated: Secondary | ICD-10-CM

## 2015-12-12 DIAGNOSIS — R45851 Suicidal ideations: Secondary | ICD-10-CM | POA: Diagnosis not present

## 2015-12-12 DIAGNOSIS — Z79899 Other long term (current) drug therapy: Secondary | ICD-10-CM | POA: Insufficient documentation

## 2015-12-12 DIAGNOSIS — F1020 Alcohol dependence, uncomplicated: Secondary | ICD-10-CM | POA: Diagnosis present

## 2015-12-12 DIAGNOSIS — Z590 Homelessness: Secondary | ICD-10-CM | POA: Insufficient documentation

## 2015-12-12 DIAGNOSIS — F1210 Cannabis abuse, uncomplicated: Secondary | ICD-10-CM | POA: Insufficient documentation

## 2015-12-12 DIAGNOSIS — F331 Major depressive disorder, recurrent, moderate: Secondary | ICD-10-CM | POA: Insufficient documentation

## 2015-12-12 DIAGNOSIS — Z9119 Patient's noncompliance with other medical treatment and regimen: Secondary | ICD-10-CM | POA: Insufficient documentation

## 2015-12-12 DIAGNOSIS — E119 Type 2 diabetes mellitus without complications: Secondary | ICD-10-CM | POA: Insufficient documentation

## 2015-12-12 DIAGNOSIS — Z794 Long term (current) use of insulin: Secondary | ICD-10-CM | POA: Insufficient documentation

## 2015-12-12 DIAGNOSIS — F1994 Other psychoactive substance use, unspecified with psychoactive substance-induced mood disorder: Secondary | ICD-10-CM | POA: Diagnosis present

## 2015-12-12 DIAGNOSIS — R000 Tachycardia, unspecified: Secondary | ICD-10-CM | POA: Insufficient documentation

## 2015-12-12 DIAGNOSIS — F17210 Nicotine dependence, cigarettes, uncomplicated: Secondary | ICD-10-CM | POA: Insufficient documentation

## 2015-12-12 DIAGNOSIS — R440 Auditory hallucinations: Secondary | ICD-10-CM | POA: Insufficient documentation

## 2015-12-12 DIAGNOSIS — F332 Major depressive disorder, recurrent severe without psychotic features: Secondary | ICD-10-CM | POA: Diagnosis present

## 2015-12-12 DIAGNOSIS — F1424 Cocaine dependence with cocaine-induced mood disorder: Secondary | ICD-10-CM | POA: Insufficient documentation

## 2015-12-12 LAB — COMPREHENSIVE METABOLIC PANEL
ALBUMIN: 3.3 g/dL — AB (ref 3.5–5.0)
ALK PHOS: 55 U/L (ref 38–126)
ALT: 14 U/L — ABNORMAL LOW (ref 17–63)
ANION GAP: 16 — AB (ref 5–15)
AST: 18 U/L (ref 15–41)
BILIRUBIN TOTAL: 0.2 mg/dL — AB (ref 0.3–1.2)
BUN: <5 mg/dL — ABNORMAL LOW (ref 6–20)
CALCIUM: 8.9 mg/dL (ref 8.9–10.3)
CO2: 23 mmol/L (ref 22–32)
Chloride: 99 mmol/L — ABNORMAL LOW (ref 101–111)
Creatinine, Ser: 0.71 mg/dL (ref 0.61–1.24)
GLUCOSE: 259 mg/dL — AB (ref 65–99)
POTASSIUM: 3.6 mmol/L (ref 3.5–5.1)
Sodium: 138 mmol/L (ref 135–145)
TOTAL PROTEIN: 6.5 g/dL (ref 6.5–8.1)

## 2015-12-12 LAB — CBC
HEMATOCRIT: 38.1 % — AB (ref 39.0–52.0)
Hemoglobin: 12.4 g/dL — ABNORMAL LOW (ref 13.0–17.0)
MCH: 26.8 pg (ref 26.0–34.0)
MCHC: 32.5 g/dL (ref 30.0–36.0)
MCV: 82.3 fL (ref 78.0–100.0)
PLATELETS: 348 10*3/uL (ref 150–400)
RBC: 4.63 MIL/uL (ref 4.22–5.81)
RDW: 12.2 % (ref 11.5–15.5)
WBC: 10.1 10*3/uL (ref 4.0–10.5)

## 2015-12-12 LAB — RAPID URINE DRUG SCREEN, HOSP PERFORMED
AMPHETAMINES: NOT DETECTED
Barbiturates: NOT DETECTED
Benzodiazepines: NOT DETECTED
COCAINE: POSITIVE — AB
OPIATES: NOT DETECTED
Tetrahydrocannabinol: POSITIVE — AB

## 2015-12-12 LAB — GLUCOSE, CAPILLARY
GLUCOSE-CAPILLARY: 215 mg/dL — AB (ref 65–99)
Glucose-Capillary: 201 mg/dL — ABNORMAL HIGH (ref 65–99)

## 2015-12-12 LAB — ETHANOL: ALCOHOL ETHYL (B): 87 mg/dL — AB (ref <5)

## 2015-12-12 LAB — SALICYLATE LEVEL

## 2015-12-12 LAB — ACETAMINOPHEN LEVEL

## 2015-12-12 MED ORDER — MAGNESIUM HYDROXIDE 400 MG/5ML PO SUSP: 30.0000 mL | Freq: Every day | ORAL | Status: DC | PRN

## 2015-12-12 MED ORDER — CITALOPRAM HYDROBROMIDE 10 MG PO TABS: 10.0000 mg | ORAL_TABLET | Freq: Every day | ORAL | Status: DC

## 2015-12-12 MED ORDER — ALUM & MAG HYDROXIDE-SIMETH 200-200-20 MG/5ML PO SUSP: 30.0000 mL | ORAL | Status: DC | PRN

## 2015-12-12 MED ORDER — TRAZODONE HCL 50 MG PO TABS: 50.0000 mg | ORAL_TABLET | Freq: Every day | ORAL | Status: DC

## 2015-12-12 MED ORDER — ACETAMINOPHEN 325 MG PO TABS: 650.0000 mg | ORAL_TABLET | Freq: Four times a day (QID) | ORAL | Status: DC | PRN

## 2015-12-12 MED ORDER — ESCITALOPRAM OXALATE 10 MG PO TABS
10.0000 mg | ORAL_TABLET | Freq: Every day | ORAL | Status: DC
  Administered 2015-12-12 – 2015-12-13 (×2): 10 mg via ORAL
  Filled 2015-12-12 (×2): qty 1

## 2015-12-12 MED ORDER — INSULIN GLARGINE 100 UNIT/ML ~~LOC~~ SOLN
15.0000 [IU] | Freq: Every day | SUBCUTANEOUS | Status: DC
  Administered 2015-12-12: 15 [IU] via SUBCUTANEOUS
  Filled 2015-12-12: qty 0.15

## 2015-12-12 MED ORDER — TRAZODONE HCL 100 MG PO TABS: 100.0000 mg | ORAL_TABLET | Freq: Every day | ORAL | Status: DC

## 2015-12-12 MED ORDER — ESCITALOPRAM OXALATE 10 MG PO TABS: 10.0000 mg | ORAL_TABLET | Freq: Every day | ORAL | Status: DC

## 2015-12-12 MED ORDER — RISPERIDONE 0.5 MG PO TABS
0.5000 mg | ORAL_TABLET | Freq: Two times a day (BID) | ORAL | Status: DC
  Administered 2015-12-12 – 2015-12-13 (×2): 0.5 mg via ORAL
  Filled 2015-12-12 (×2): qty 1

## 2015-12-12 MED ORDER — SODIUM CHLORIDE 0.9 % IV BOLUS (SEPSIS)
1000.0000 mL | Freq: Once | INTRAVENOUS | Status: AC
  Administered 2015-12-12: 1000 mL via INTRAVENOUS

## 2015-12-12 MED ORDER — INSULIN GLARGINE 100 UNIT/ML ~~LOC~~ SOLN
15.0000 [IU] | Freq: Every day | SUBCUTANEOUS | Status: DC
  Administered 2015-12-13: 15 [IU] via SUBCUTANEOUS

## 2015-12-12 NOTE — Plan of Care (Signed)
[facility]  Reason for Crisis Plan:  Substance Abuse   Plan of Care:  Referral for Substance Abuse  Family Support:      Current Living Environment:  Living Arrangements: Alone  Insurance:   Hospital Account    Name Acct ID Class Status Primary Coverage   [name] FlemingsMcNeill, Kennedy [id number] BEHAVIORAL HEALTH OBSERVATION Open None        Guarantor Account (for Hospital Account [id number])    Name Relation to Pt Service Area Active? Acct Type   [name] FlemingsMcNeill, Kennedy Self [facility] Yes Behavioral Health   Address Phone       [place], [place] [address] [phone](H)          Coverage Information (for Hospital Account [id number])    Not on file      Legal Guardian:     Primary Care Provider:  No primary care provider on file.  Current Outpatient Providers:  N/A  Psychiatrist:     Counselor/Therapist:     Compliant with Medications:  No  Additional Information:   [name] CordialKaryl F Mckala Kennedy 5/9/20172:33 PM

## 2015-12-12 NOTE — ED Provider Notes (Signed)
Patient is assessed for transfer for EMTALA. Patient has been accepted by Dr. Lucianne MussKumar at behavioral health. Patient is alert. He has no complaints this morning. He reports that he has eaten his breakfast without difficulty. He denies chest pain or shortness of breath. Patient is oriented 3 and cooperative. Heart is regular without rub murmur gallop. Lungs are clear without wheeze rhonchi rail. Patient has no peripheral edema. Movements are coordinated purposeful symmetric. Vital signs are stable. Patient is assessed to be stable for transport for ongoing psychiatric treatment.  Arby BarretteMarcy Saaya Procell, MD 12/12/15 1031

## 2015-12-12 NOTE — ED Provider Notes (Signed)
CSN: [id number]     Arrival date & time 12/12/15  0424 History   First MD Initiated Contact with Patient 12/12/15 [phone]     Chief Complaint  Patient presents with  . Suicidal    (Consider location/radiation/quality/duration/timing/severity/associated sxs/prior Treatment) The history is provided by the patient and medical records. No language interpreter was used.   [name] Kennedy is a 50 y.o. male  with a PMH of DM, ETOH abuse who presents to the Emergency Department for suicidal ideations. Patient states he has had suicidal thoughts since he has a child. These thoughts have been worsening the last 3 years. He states that he has thought of multiple plans including jumping in front of a car. He came to the ED today on his own for help. Admits to auditory hallucinations which he states are voices telling him to "do demeaning things". Denies homicidal thoughts and visual hallucinations. Admits to cocaine use and daily alcohol use.  Past Medical History  Diagnosis Date  . Suicidal ideations   . Diabetes mellitus without complication (HCC)   . Alcohol abuse    History reviewed. No pertinent past surgical history. No family history on file. Social History  Substance Use Topics  . Smoking status: Current Every Day Smoker -- 0.00 packs/day    Types: Cigarettes  . Smokeless tobacco: None  . Alcohol Use: Yes    Review of Systems  Respiratory: Negative for shortness of breath.   Cardiovascular: Negative.   Gastrointestinal: Negative for abdominal pain.  Psychiatric/Behavioral: Positive for suicidal ideas and hallucinations.  All other systems reviewed and are negative.     Allergies  Review of patient's allergies indicates no known allergies.  Home Medications   Prior to Admission medications   Medication Sig Start Date End Date Taking? Authorizing Provider  escitalopram (LEXAPRO) 10 MG tablet Take 1 tablet (10 mg total) by mouth daily. For depression 10/31/15   Sanjuana KavaAgnes I Nwoko, NP   insulin glargine (LANTUS) 100 UNIT/ML injection Inject 0.15 mLs (15 Units total) into the skin at bedtime. For diabetes management 10/31/15   Sanjuana KavaAgnes I Nwoko, NP  traZODone (DESYREL) 50 MG tablet Take 1 tablet (50 mg total) by mouth at bedtime. For sleep 10/31/15   Sanjuana KavaAgnes I Nwoko, NP   BP 102/78 mmHg  Pulse 103  Temp(Src) 98.2 F (36.8 C) (Oral)  Resp 16  Ht 5\' 6"  (1.676 m)  Wt 71.215 kg  BMI 25.35 kg/m2  SpO2 99% Physical Exam  Constitutional: He is oriented to person, place, and time. He appears well-developed and well-nourished.  Alert and in no acute distress  HENT:  Head: Normocephalic and atraumatic.  Cardiovascular: Normal heart sounds and intact distal pulses.  Exam reveals no gallop and no friction rub.   No murmur heard. Mildly tachycardic but regular.  Pulmonary/Chest: Effort normal and breath sounds normal. No respiratory distress. He has no wheezes. He has no rales. He exhibits no tenderness.  Abdominal: Soft. Bowel sounds are normal. He exhibits no distension and no mass. There is no tenderness. There is no rebound and no guarding.  Musculoskeletal: He exhibits no edema.  Neurological: He is alert and oriented to person, place, and time.  Skin: Skin is warm and dry.  Nursing note and vitals reviewed.   ED Course  Procedures (including critical care time) Labs Review Labs Reviewed  CBC - Abnormal; Notable for the following:    Hemoglobin 12.4 (*)    HCT 38.1 (*)    All other components within normal limits  COMPREHENSIVE METABOLIC PANEL  ETHANOL  SALICYLATE LEVEL  ACETAMINOPHEN LEVEL  URINE RAPID DRUG SCREEN, HOSP PERFORMED    Imaging Review No results found. I have personally reviewed and evaluated these images and lab results as part of my medical decision-making.   EKG Interpretation None      MDM   Final diagnoses:  None   [name] Kennedy presents to ED for suicidal thoughts. Hx of DM. CMP with glucose 259, cl 99, anion gap of 16. ETOH 87. Will  give 1L fluids. UDS + for cocaine and THC. All other labs reviewed and reassuring. Case discussed with PA Geiple at shift change who will follow up on fluids/blood sugar. Dispo pending TTS.    [facility] [name], PA-C 12/12/15 1610  [name], MD 12/12/15 [phone]

## 2015-12-12 NOTE — ED Notes (Signed)
Staffing office / charge nurse notified for pt.'s sitter , purple scrubs given to pt. , security notified to wand pt.  

## 2015-12-12 NOTE — H&P (Signed)
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 Cocaine-induced mood disorder ([place]) [F14.94] 12/12/2015    Priority: High  . Alcohol dependence ([place]) [F10.20] 01/24/2013    Priority: High  . Cocaine dependence ([place]) [F14.20] 01/24/2013    Priority: High  . Suicidal ideation [R45.851] 01/23/2013    Priority: High  . Alcohol dependence with uncomplicated withdrawal ([place]) [F10.230]   . Insomnia [G47.00]   . 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 Cocaine abuse [F14.10] 10/24/2015  . Alcohol-induced mood disorder ([place]) [F10.94] 06/26/2015  . Substance induced mood disorder ([place]) [F19.94] 06/26/2015  . Uncomplicated alcohol dependence ([place]) [F10.20]    History of Present Illness:: On admission to the ED:  50 y.o. male who presents voluntarily to [facility] with c/o SI. Pt acknowledges having SI since childhood, but it worsening over the past 3 years. Pt was unable to determine an acute stressor to his baseline SI that caused him to present to the ED yesterday. Pt admits to not having f/u with [facility], nor being med compliant however; was not able to indicate any reasons for his non-compliance. Pt answered many clarifying questions with "I don't know". Pt endorsed hearing AH saying "degrading things" to him. When asked for details, pt indicated that the voices were telling him "that I need to kill myself". Pt reported that he had been hearing these voices "a long time". Pt indicated that he had previous suicide attempts "a long time ago" and reported that the last attempt was "I tried to walk out in front of a car".   On  assessment:  Patient was irritable on assessment and wants a diagnosis so he can get medication.  He feels he is self-medicating with alcohol and cocaine but needs medication.  He also wants a diagnosis to get "disability" so he can get housing.  [name] Kennedy has been homeless for five years after getting out of prison for a multitude of charges.  He reports not being able to go to [facility] but no reason.  Refused all other alternatives as he wants his own place or long term care.  He does not work and is not interested in [place].  Initially, he did not know what TROSA was but then said he wasn't interested because he heard "bad things about it."  Reports hearing voices but also abuses cocaine daily, does not appear to be responding to internal stimuli.  Denies withdrawal symptoms.  Associated Signs/Symptoms: Depression Symptoms:  depressed mood, (Hypo) Manic Symptoms:  Hallucinations, Anxiety Symptoms:  none Psychotic Symptoms:  Hallucinations: Auditory PTSD Symptoms: Negative Total Time spent with patient: 45 minutes  Past Psychiatric History: substance abuse  Is the patient at risk to self? No.  Has the patient been a risk to self in the past 6 months? No.  Has the patient been a risk to self within the distant past? Yes.    Is the patient a risk to others? No.  Has the patient been a risk to others in the past 6 months? No.  Has the patient been a risk to others within the distant past? No.   Prior Inpatient Therapy:  [facility] Prior Outpatient Therapy:  [facility]  Alcohol Screening:  Frequently, amount varies  Substance Abuse History in the last 12 months:  Yes.   Consequences of Substance Abuse: Legal Consequences:  past felonies Previous Psychotropic Medications: No  Psychological Evaluations: Yes  Past Medical History:  Past Medical History  Diagnosis Date  . Suicidal ideations   . Diabetes mellitus without complication ([place])   . Alcohol abuse    No past surgical history on  file. Family History: No family history on file. Family Psychiatric  History: substance abuse Tobacco Screening: None Social History:  History  Alcohol Use  . Yes     History  Drug Use  . Yes  . Special: Cocaine    Comment: last use 10/22/2015     Additional Social History:                           Allergies:  No Known Allergies Lab Results:  Results for orders placed or performed during the hospital encounter of 12/12/15 (from the past 48 hour(s))  Comprehensive metabolic panel     Status: Abnormal   Collection Time: 12/12/15  4:41 AM  Result Value Ref Range   Sodium 138 135 - 145 mmol/L   Potassium 3.6 3.5 - 5.1 mmol/L   Chloride 99 (L) 101 - 111 mmol/L   CO2 23 22 - 32 mmol/L   Glucose, Bld 259 (H) 65 - 99 mg/dL   BUN <5 (L) 6 - 20 mg/dL   Creatinine, Ser 0.71 0.61 - 1.24 mg/dL   Calcium 8.9 8.9 - 10.3 mg/dL   Total Protein 6.5 6.5 - 8.1 g/dL   Albumin 3.3 (L) 3.5 - 5.0 g/dL   AST 18 15 - 41 U/L   ALT 14 (L) 17 - 63 U/L   Alkaline Phosphatase 55 38 - 126 U/L   Total Bilirubin 0.2 (L) 0.3 - 1.2 mg/dL   GFR calc non Af Amer >60 >60 mL/min   GFR calc Af Amer >60 >60 mL/min    Comment: (NOTE) The eGFR has been calculated using the CKD EPI equation. This calculation has not been validated in all clinical situations. eGFR's persistently <60 mL/min signify possible Chronic Kidney Disease.    Anion gap 16 (H) 5 - 15  Ethanol     Status: Abnormal   Collection Time: 12/12/15  4:41 AM  Result Value Ref Range   Alcohol, Ethyl (B) 87 (H) <5 mg/dL    Comment:        LOWEST DETECTABLE LIMIT FOR SERUM ALCOHOL IS 5 mg/dL FOR MEDICAL PURPOSES ONLY   Salicylate level     Status: None   Collection Time: 12/12/15  4:41 AM  Result Value Ref Range   Salicylate Lvl <4.2 2.8 - 30.0 mg/dL  Acetaminophen level     Status: Abnormal   Collection Time: 12/12/15  4:41 AM  Result Value Ref Range   Acetaminophen (Tylenol), Serum <10 (L) 10 - 30 ug/mL    Comment:         THERAPEUTIC CONCENTRATIONS VARY SIGNIFICANTLY. A RANGE OF 10-30 ug/mL MAY BE AN EFFECTIVE CONCENTRATION FOR MANY PATIENTS. HOWEVER, SOME ARE BEST TREATED AT CONCENTRATIONS OUTSIDE THIS RANGE. ACETAMINOPHEN CONCENTRATIONS >150 ug/mL AT 4 HOURS AFTER INGESTION AND >50 ug/mL AT 12 HOURS AFTER INGESTION ARE OFTEN ASSOCIATED WITH TOXIC REACTIONS.   cbc     Status: Abnormal   Collection Time: 12/12/15  4:41 AM  Result Value Ref Range   WBC 10.1 4.0 - 10.5 K/uL   RBC 4.63 4.22 - 5.81  MIL/uL   Hemoglobin 12.4 (L) 13.0 - 17.0 g/dL   HCT 38.1 (L) 39.0 - 52.0 %   MCV 82.3 78.0 - 100.0 fL   MCH 26.8 26.0 - 34.0 pg   MCHC 32.5 30.0 - 36.0 g/dL   RDW 12.2 11.5 - 15.5 %   Platelets 348 150 - 400 K/uL  Rapid urine drug screen (hospital performed)     Status: Abnormal   Collection Time: 12/12/15  4:41 AM  Result Value Ref Range   Opiates NONE DETECTED NONE DETECTED   Cocaine POSITIVE (A) NONE DETECTED   Benzodiazepines NONE DETECTED NONE DETECTED   Amphetamines NONE DETECTED NONE DETECTED   Tetrahydrocannabinol POSITIVE (A) NONE DETECTED   Barbiturates NONE DETECTED NONE DETECTED    Comment:        DRUG SCREEN FOR MEDICAL PURPOSES ONLY.  IF CONFIRMATION IS NEEDED FOR ANY PURPOSE, NOTIFY LAB WITHIN 5 DAYS.        LOWEST DETECTABLE LIMITS FOR URINE DRUG SCREEN Drug Class       Cutoff (ng/mL) Amphetamine      1000 Barbiturate      200 Benzodiazepine   025 Tricyclics       427 Opiates          300 Cocaine          300 THC              50     Blood Alcohol level:  Lab Results  Component Value Date   ETH 87* 12/12/2015   ETH 31* 01/26/7627    Metabolic Disorder Labs:  No results found for: HGBA1C, MPG No results found for: PROLACTIN No results found for: CHOL, TRIG, HDL, CHOLHDL, VLDL, LDLCALC  Current Medications: No current facility-administered medications for this encounter.   PTA Medications: Prescriptions prior to admission  Medication Sig Dispense Refill Last  Dose  . escitalopram (LEXAPRO) 10 MG tablet Take 1 tablet (10 mg total) by mouth daily. For depression (Patient not taking: Reported on 12/12/2015) 30 tablet 0 Not Taking at Unknown time  . insulin glargine (LANTUS) 100 UNIT/ML injection Inject 0.15 mLs (15 Units total) into the skin at bedtime. For diabetes management (Patient not taking: Reported on 12/12/2015) 10 mL 0 Not Taking at Unknown time  . traZODone (DESYREL) 50 MG tablet Take 1 tablet (50 mg total) by mouth at bedtime. For sleep (Patient not taking: Reported on 12/12/2015) 30 tablet 0 Not Taking at Unknown time    Musculoskeletal: Strength & Muscle Tone: within normal limits Gait & Station: normal Patient leans: N/A  Psychiatric Specialty Exam: Physical Exam  Review of Systems  Constitutional: Negative.   HENT: Negative.   Eyes: Negative.   Respiratory: Negative.   Cardiovascular: Negative.   Gastrointestinal: Negative.   Genitourinary: Negative.   Musculoskeletal: Negative.   Skin: Negative.   Neurological: Negative.   Endo/Heme/Allergies: Negative.   Psychiatric/Behavioral: Positive for depression and substance abuse.    There were no vitals taken for this visit.There is no weight on file to calculate BMI.  General Appearance: Casual  Eye Contact::  Good  Speech:  Normal Rate  Volume:  Normal  Mood:  Depressed and Irritable  Affect:  Congruent  Thought Process:  Coherent  Orientation:  Full (Time, Place, and Person)  Thought Content:  Hallucinations: Auditory  Suicidal Thoughts:  Yes.  without intent/plan  Homicidal Thoughts:  No  Memory:  Immediate;   Good Recent;   Good Remote;   Good  Judgement:  Fair  Insight:  Fair  Psychomotor Activity:  Normal  Concentration:  Good  Recall:  Good  Fund of Knowledge:Good  Language: Good  Akathisia:  No  Handed:  Right  AIMS (if indicated):     Assets:  Leisure Time Physical Health Resilience  ADL's:  Intact  Cognition: WNL  Sleep:        Treatment Plan  Summary: Daily contact with patient to assess and evaluate symptoms and progress in treatment, Medication management and Plan cocaine induced mood disorder:  -Crisis stabilization -Medication management:  Start Rispderdal 0.5 mg BID for psychosis and Celexa 10 mg daily for depression, Trazodone 100 mg at bedtime for sleep PRN. -Individual counseling  Observation Level/Precautions:  15 minute checks  Laboratory:  completed in the ED and reviewed  Psychotherapy:  Individual counseling  Medications:  Risperdal 0.5 mg BID, Celexa 10 mg daily, and Trazodone 100 mg at bedtime for sleep PRN  Consultations:  None  Discharge Concerns:  None  Estimated LOS:  23 hours  Other:       Waylan Boga, NP 5/9/201712:52 PM

## 2015-12-12 NOTE — ED Notes (Signed)
TTS being performed.  

## 2015-12-12 NOTE — Progress Notes (Signed)
Nursing Admission Note  [facility] R/T long time daily alcohol abuse and frequent abuse of crack cocaine. [facility] and at various inpatient facilities that patient can not name. Patient's mood is quite impatient and irritabile, states he is "tired" of just getting admitted and discharged without anybody addressing my mental illness which is why I drink in the first place". Patient admits to SI without a plan, denies HI or VH, positive for AH of "voices telling me to kill myself" that pt states he has had daily "for a long time". Patient states desire to "be off the alcohol and any kind of drug but i need some place that will keep me there so I can't drink or do drugs and will treat my depression so I won't go back to them". Patient states "I've spent most of my life in and out of prison" which are his only sobriety periods, and can barely read beyond being able to write his name. States "I'm so tired of the homelessness, it's been 5 years of trying to find a place to be and to get the alcohol and drugs and if they let me out I might as well die cause I can't do it anymore";. Skin search done with no variscosities and no contraband found.

## 2015-12-12 NOTE — ED Notes (Signed)
Patient was given a snack and drink. 

## 2015-12-12 NOTE — ED Notes (Signed)
Pt. wanded by security at triage .  

## 2015-12-12 NOTE — ED Notes (Signed)
Pt. reports suicidal ideation plans to shoot himself , denies hallucinations .

## 2015-12-12 NOTE — ED Notes (Signed)
IV removed from rt. AC. 2x2 applied with tape.

## 2015-12-12 NOTE — ED Notes (Signed)
Per Kenney HousemanLindsay, AC, [facility] Dr Lucianne MussKumar.
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
## 2015-12-13 DIAGNOSIS — F332 Major depressive disorder, recurrent severe without psychotic features: Secondary | ICD-10-CM | POA: Diagnosis not present

## 2015-12-13 DIAGNOSIS — F1020 Alcohol dependence, uncomplicated: Secondary | ICD-10-CM | POA: Diagnosis not present

## 2015-12-13 DIAGNOSIS — F1994 Other psychoactive substance use, unspecified with psychoactive substance-induced mood disorder: Secondary | ICD-10-CM

## 2015-12-13 DIAGNOSIS — F1494 Cocaine use, unspecified with cocaine-induced mood disorder: Secondary | ICD-10-CM | POA: Diagnosis not present

## 2015-12-13 LAB — GLUCOSE, CAPILLARY: GLUCOSE-CAPILLARY: 161 mg/dL — AB (ref 65–99)

## 2015-12-13 MED ORDER — TRAZODONE HCL 50 MG PO TABS: 50.0000 mg | ORAL_TABLET | Freq: Every day | ORAL | Status: DC

## 2015-12-13 MED ORDER — ESCITALOPRAM OXALATE 10 MG PO TABS: 10.0000 mg | ORAL_TABLET | Freq: Every day | ORAL | Status: DC

## 2015-12-13 MED ORDER — RISPERIDONE 0.5 MG PO TABS: 0.5000 mg | ORAL_TABLET | Freq: Two times a day (BID) | ORAL | Status: DC

## 2015-12-13 NOTE — [facility] Counselor (Signed)
Back date note 12/12/15: Spoke with patient regarding aftercare plans, pt. Stated he does not know and refused help with referrals or discussion of aftercare/ continued treatment. Patient given reassurance and explained that when he is able or would like help/ discuss or treatment plan counselor is available and next shift/ bight shift tech was informed as well.  Chole Driver K. Sherlon HandingHarris, LCAS-A, LPC-A, [facility]  Counselor 12/13/2015 7:20 AM

## 2015-12-13 NOTE — Progress Notes (Signed)
Patient ID: [name] Kennedy, male   DOB: 10/02/1965, 50 y.o.   MRN: [id number] Patient was discharged to home/self care.  Patient acknowledged receipt of all discharge information.  Patient stated the need to continue with follow up are in a less acute setting.  Patient denies SI, HI and AVH at this time.  Patient is able to contract for safety.

## 2015-12-13 NOTE — [facility] Counselor (Signed)
Spoke with pt. Performed needs assessment and brief counseling. Pt primary concern and need is housing which will help pt. With stability and maintaining sobriety followed by access to care services through [name] [while residing homeless or otherwise in [name] coverage area].  Pt discussed past options and treatment/ relapse prevention. At first pt. Was reluctant and hesitant, rapport established listening skills used and  Motivational interviewing for S.A. Followed by [facility] and needs assessment for counseling/ mental helath.  Pt history reviewed and consult with [name], NP.  Pt . To be d/c today. Pt. Reluctant for S.A. Treatment or mental health at thsi time, pt. Primary concern housing/ stability. Discussed S.A. And pt needs as well, pt. Appears to be in pre-contemplation stage of change.   Intervention/ case mngmt. Performed on behalf of client and with verbal consent. [facility] with [name], pt still on wait list and per [facility] [phone] pt can use [facility] phone and services for contact from [facility] for Care Coordination referral appt. Confirmed with [facility][ hotline] , pt has referral for Coordination of Care Services and was discussed with pt. Pt agrees and understands it could be up to May 17th, 2017 before referral [facility] contacts pt. / [facility] for follow up. Pt. Plan/ d/c orders include local mental health resources and S.A. treatment resource followed by [facility] contact info. Upon request pt. Also will receive in hand [employer] hotline number for use.                 Historical: Pt states was in [place] for at or around 3 months after November 2016 d/c from [facility] states was declined at [facility] per pt. Due to diabetic issues. Pt states stayed at [facility] [place], moved to [place] with friend at or around February, had issues with friend/ relapse and [facility]. Pt states ultimate plan  for recover is in [name] area.  [name] Kennedy, LCAS-A, LPC-A, [facility]  Counselor 12/13/2015 11:25 AM

## 2015-12-13 NOTE — Discharge Instructions (Signed)
You are encourged to follow up with [facility] under [facility] for shelter services, and for follow up contact phone call from [facility] for Referral [facility][Care Coordination Services]. You have been given an additional list of shelter services and food pantry/ warm meals for your needs. You are also encouraged to follow up with [facility] or [facility] for follow-up / continuation of care. Additionally, you are encouraged to follow up with [facility] [[facility]] for help in Substance Abuse recovery.

## 2015-12-13 NOTE — Progress Notes (Signed)
Patient alert and watching television. Patient with flat affect. Patient states he continues to have thoughts of harming himself. Patient does contract for safety. Patient with minimal conversation and interaction with staff. Patient refused ordered medications and denies any withdrawal symptoms. Patient was given a snack and ginger ale to drink. Patient monitored by staff continuously and remains safe on unit.

## 2015-12-13 NOTE — Discharge Summary (Signed)
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 Cocaine-induced mood disorder (HCC) [F14.94] 12/12/2015    Priority: High  . Substance induced mood disorder (HCC) [F19.94] 06/26/2015    Priority: Medium  . Alcohol dependence (HCC) [F10.20] 01/24/2013    Priority: Medium  . Cocaine dependence (HCC) [F14.20] 01/24/2013    Priority: Medium  . Alcohol dependence with uncomplicated withdrawal (HCC) [F10.230]   . Insomnia [G47.00]   . 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 Cocaine abuse [F14.10] 10/24/2015  . Alcohol-induced mood disorder (HCC) [F10.94] 06/26/2015  . Uncomplicated alcohol dependence (HCC) [F10.20]   . Suicidal ideation [R45.851] 01/23/2013   Subjective: Pt seen and chart reviewed. Pt is alert/oriented x4, calm, cooperative, and appropriate to situation. Pt denies suicidal/homicidal ideation and psychosis and does not appear to be responding to internal stimuli. Pt reports that he felt overwhelmed due to his substance abuse, lack of support system, and lack of satisfaction with [facility] services. Pt receptive to SW helping with services as listed below.   Interval Hx 12/12/15: On admission to the ED: 50 y.o. male who [facility] with c/o SI. Pt acknowledges having SI since childhood, but it worsening over the past 3 years. Pt was unable to determine an acute stressor to his baseline SI that caused him to present to the ED yesterday. Pt admits to not having f/u with [facility], nor being med compliant however; was not able to indicate any reasons for his non-compliance. Pt answered many  clarifying questions with "I don't know". Pt endorsed hearing AH saying "degrading things" to him. When asked for details, pt indicated that the voices were telling him "that I need to kill myself". Pt reported that he had been hearing these voices "a long time". Pt indicated that he had previous suicide attempts "a long time ago" and reported that the last attempt was "I tried to walk out in front of a car".   Patient was irritable on assessment and wants a diagnosis so he can get medication. He feels he is self-medicating with alcohol and cocaine but needs medication. He also wants a diagnosis to get "disability" so he can get housing. [name] Kennedy has been homeless for five years after getting out of prison for a multitude of charges. He reports not being able to [facility] to [facility] but no reason. Refused all other alternatives as he wants his own place or long term care. He does not work and is not interested in [facility]. Initially, he did not know what [facility] was but then said he wasn't interested because he heard "bad things about it." Reports hearing voices but also abuses cocaine daily, does not appear to be responding to internal stimuli. Denies withdrawal symptoms.  Total Time spent with patient: 45 minutes  Past Psychiatric History: substance abuse   [facility] Prior Outpatient Therapy:  [facility] Frequently, amount varies Substance Abuse History in the last 12 months: Yes.  Consequences of Substance Abuse: Legal Consequences: past felonies Previous Psychotropic Medications: No  Psychological Evaluations: Yes  Past Medical History:  Past Medical History  Diagnosis Date  . Suicidal ideations   . Diabetes mellitus without complication (HCC)   . Alcohol abuse    History reviewed. No pertinent past surgical  history. Family History: History reviewed. No pertinent family history. Family Psychiatric History: substance abuse Tobacco Screening:  None Social History:  History  Alcohol Use  . Yes    History  Drug Use  . Yes  . Special: Cocaine    Comment: last use 10/22/2015     Additional Social History:     History of alcohol / drug use?: Yes Longest period of sobriety (when/how long): Patient does not know but states "only when I'm in a place where I cant drink or do any drugs like in prison where I've spent most of my life" Negative Consequences of Use: Financial, Legal, Personal relationships, Work / School Withdrawal Symptoms: Irritability Name of Substance 1: Crack cocaine 1 - Age of First Use: "I don't know" 1 - Frequency: Approximately weekly 1 - Duration: several hours 1 - Last Use / Amount: yesterday                  Allergies: No Known Allergies Lab Results:   Lab Results Last 48 Hours    Results for orders placed or performed during the hospital encounter of 12/12/15 (from the past 48 hour(s))  Glucose, capillary Status: Abnormal   Collection Time: 12/12/15 5:26 PM  Result Value Ref Range   Glucose-Capillary 201 (H) 65 - 99 mg/dL  Glucose, capillary Status: Abnormal   Collection Time: 12/12/15 9:30 PM  Result Value Ref Range   Glucose-Capillary 215 (H) 65 - 99 mg/dL  Glucose, capillary Status: Abnormal   Collection Time: 12/13/15 6:51 AM  Result Value Ref Range   Glucose-Capillary 161 (H) 65 - 99 mg/dL      Blood Alcohol level:   Recent Labs    Lab Results  Component Value Date   ETH 87* 12/12/2015   ETH 31* 10/23/2015      Metabolic Disorder Labs:   Recent Labs    No results found for: HGBA1C, MPG    Recent Labs    No results found for: PROLACTIN    Recent Labs    No results found for: CHOL, TRIG, HDL, CHOLHDL, VLDL, LDLCALC    Current Medications: Current Facility-Administered Medications  Medication Dose Route Frequency Provider Last Rate Last Dose  . acetaminophen (TYLENOL) tablet 650  mg 650 mg Oral Q6H PRN Charm Rings, NP    . alum & mag hydroxide-simeth (MAALOX/MYLANTA) 200-200-20 MG/5ML suspension 30 mL 30 mL Oral Q4H PRN Charm Rings, NP    . escitalopram (LEXAPRO) tablet 10 mg 10 mg Oral Daily Charm Rings, NP  10 mg at 12/13/15 0813  . insulin glargine (LANTUS) injection 15 Units 15 Units Subcutaneous Daily Charm Rings, NP  15 Units at 12/13/15 [phone]  . magnesium hydroxide (MILK OF MAGNESIA) suspension 30 mL 30 mL Oral Daily PRN Charm Rings, NP    . risperiDONE (RISPERDAL) tablet 0.5 mg 0.5 mg Oral BID Charm Rings, NP  0.5 mg at 12/13/15 0813  . traZODone (DESYREL) tablet 100 mg 100 mg Oral QHS Charm Rings, NP  100 mg at 12/12/15 2200  . traZODone (DESYREL) tablet 50 mg 50 mg Oral QHS Charm Rings, NP  Stopped at 12/12/15 2200   PTA Medications: Prescriptions prior to admission  Medication Sig Dispense Refill Last Dose  . escitalopram (LEXAPRO) 10 MG tablet Take 1 tablet (10 mg total) by mouth daily. For depression (Patient not taking: Reported on 12/12/2015) 30 tablet 0 Not Taking at Unknown time  . insulin glargine (LANTUS) 100 UNIT/ML  injection Inject 0.15 mLs (15 Units total) into the skin at bedtime. For diabetes management (Patient not taking: Reported on 12/12/2015) 10 mL 0 Not Taking at Unknown time  . traZODone (DESYREL) 50 MG tablet Take 1 tablet (50 mg total) by mouth at bedtime. For sleep (Patient not taking: Reported on 12/12/2015) 30 tablet 0 Not Taking at Unknown time    Musculoskeletal: Strength & Muscle Tone: within normal limits Gait & Station: normal Patient leans: N/A  Psychiatric Specialty Exam: Physical Exam  Review of Systems  Constitutional: Negative.  HENT: Negative.  Eyes: Negative.  Respiratory: Negative.  Cardiovascular: Negative.  Gastrointestinal: Negative.  Genitourinary: Negative.  Musculoskeletal:  Negative.  Skin: Negative.  Neurological: Negative.  Endo/Heme/Allergies: Negative.  Psychiatric/Behavioral: Positive for depression and substance abuse.    Blood pressure 121/63, pulse 84, temperature 98.7 F (37.1 C), temperature source Oral, resp. rate 18, height  (1.676 m), weight 69.287 kg (152 lb 12 oz), SpO2 99 %.Body mass index is 24.67 kg/(m^2).  General Appearance: Casual  Eye Contact:: Good  Speech: Normal Rate  Volume: Normal  Mood: Depressed  Affect: Congruent  Thought Process: Coherent  Orientation: Full (Time, Place, and Person)  Thought Content: Symptoms, worries, concerns, discharge plans  Suicidal Thoughts: No  Homicidal Thoughts: No  Memory: Immediate; Fair Recent; Fair Remote; Fair  Judgement: Fair  Insight: Fair  Psychomotor Activity: Normal  Concentration: Good  Recall: Fair  Fund of Knowledge:Good  Language: Good  Akathisia: No  Handed: Right  AIMS (if indicated):    Assets: Leisure Time Physical Health Resilience  ADL's: Intact  Cognition: WNL  Sleep:     Treatment Plan Summary: Cocaine-induced mood disorder (HCC), improving, stable for outpatient management  Do CIWA to establish safe parameters prior to discharge. If below 10 and asymptomatic, pt can be managed outpatient.   Medications:  -Continue Rispderdal 0.5 mg BID for psychosis and Celexa 10 mg daily for depression, Trazodone 100 mg at bedtime for sleep PRN. -14 day Rx for these meds -Discharge home -Give outpatient resources (set up by SW with [facility] evaluation appointment and [facility])  Beau Fanny, [place] 5/10/20179:12 AM

## 2015-12-15 ENCOUNTER — Inpatient Hospital Stay (HOSPITAL_COMMUNITY)
Admission: AD | Admit: 2015-12-15 | Discharge: 2015-12-22 | DRG: 885 | Disposition: A | Discharge status: Home / Self Care | Payer: No Typology Code available for payment source | Source: Intra-hospital | Attending: Psychiatry | Admitting: Psychiatry

## 2015-12-15 ENCOUNTER — Emergency Department (HOSPITAL_COMMUNITY)
Admission: EM | Admit: 2015-12-15 | Discharge: 2015-12-15 | Disposition: A | Discharge status: Other Acute Inpatient Hospital | Payer: Self-pay | Attending: Emergency Medicine | Admitting: Emergency Medicine

## 2015-12-15 ENCOUNTER — Encounter (HOSPITAL_COMMUNITY): Payer: Self-pay | Admitting: *Deleted

## 2015-12-15 ENCOUNTER — Encounter (HOSPITAL_COMMUNITY): Payer: Self-pay | Admitting: Emergency Medicine

## 2015-12-15 DIAGNOSIS — F1020 Alcohol dependence, uncomplicated: Secondary | ICD-10-CM | POA: Diagnosis present

## 2015-12-15 DIAGNOSIS — F17210 Nicotine dependence, cigarettes, uncomplicated: Secondary | ICD-10-CM | POA: Insufficient documentation

## 2015-12-15 DIAGNOSIS — Z590 Homelessness: Secondary | ICD-10-CM

## 2015-12-15 DIAGNOSIS — F419 Anxiety disorder, unspecified: Secondary | ICD-10-CM | POA: Diagnosis present

## 2015-12-15 DIAGNOSIS — F321 Major depressive disorder, single episode, moderate: Secondary | ICD-10-CM | POA: Diagnosis not present

## 2015-12-15 DIAGNOSIS — R45851 Suicidal ideations: Secondary | ICD-10-CM | POA: Diagnosis present

## 2015-12-15 DIAGNOSIS — E119 Type 2 diabetes mellitus without complications: Secondary | ICD-10-CM | POA: Insufficient documentation

## 2015-12-15 DIAGNOSIS — F329 Major depressive disorder, single episode, unspecified: Secondary | ICD-10-CM | POA: Diagnosis present

## 2015-12-15 DIAGNOSIS — G4700 Insomnia, unspecified: Secondary | ICD-10-CM | POA: Diagnosis present

## 2015-12-15 DIAGNOSIS — F1099 Alcohol use, unspecified with unspecified alcohol-induced disorder: Secondary | ICD-10-CM | POA: Diagnosis not present

## 2015-12-15 DIAGNOSIS — F1424 Cocaine dependence with cocaine-induced mood disorder: Secondary | ICD-10-CM | POA: Diagnosis present

## 2015-12-15 DIAGNOSIS — F1410 Cocaine abuse, uncomplicated: Secondary | ICD-10-CM | POA: Insufficient documentation

## 2015-12-15 DIAGNOSIS — E1165 Type 2 diabetes mellitus with hyperglycemia: Secondary | ICD-10-CM | POA: Diagnosis present

## 2015-12-15 DIAGNOSIS — F1420 Cocaine dependence, uncomplicated: Secondary | ICD-10-CM | POA: Diagnosis present

## 2015-12-15 DIAGNOSIS — F10239 Alcohol dependence with withdrawal, unspecified: Secondary | ICD-10-CM | POA: Diagnosis present

## 2015-12-15 DIAGNOSIS — Z79899 Other long term (current) drug therapy: Secondary | ICD-10-CM | POA: Insufficient documentation

## 2015-12-15 DIAGNOSIS — Z794 Long term (current) use of insulin: Secondary | ICD-10-CM | POA: Insufficient documentation

## 2015-12-15 DIAGNOSIS — F332 Major depressive disorder, recurrent severe without psychotic features: Principal | ICD-10-CM | POA: Diagnosis present

## 2015-12-15 DIAGNOSIS — IMO0002 Reserved for concepts with insufficient information to code with codable children: Secondary | ICD-10-CM | POA: Diagnosis present

## 2015-12-15 DIAGNOSIS — F1010 Alcohol abuse, uncomplicated: Secondary | ICD-10-CM | POA: Insufficient documentation

## 2015-12-15 HISTORY — DX: Bipolar disorder, unspecified: F31.9

## 2015-12-15 HISTORY — DX: Major depressive disorder, single episode, unspecified: F32.9

## 2015-12-15 LAB — COMPREHENSIVE METABOLIC PANEL
ALBUMIN: 3.9 g/dL (ref 3.5–5.0)
ALT: 13 U/L — AB (ref 17–63)
AST: 16 U/L (ref 15–41)
Alkaline Phosphatase: 73 U/L (ref 38–126)
Anion gap: 13 (ref 5–15)
BILIRUBIN TOTAL: 0.3 mg/dL (ref 0.3–1.2)
BUN: 6 mg/dL (ref 6–20)
CO2: 27 mmol/L (ref 22–32)
CREATININE: 0.74 mg/dL (ref 0.61–1.24)
Calcium: 9.2 mg/dL (ref 8.9–10.3)
Chloride: 102 mmol/L (ref 101–111)
GFR calc Af Amer: >60 mL/min (ref >60)
GLUCOSE: 235 mg/dL — AB (ref 65–99)
POTASSIUM: 3.8 mmol/L (ref 3.5–5.1)
Sodium: 142 mmol/L (ref 135–145)
TOTAL PROTEIN: 7.3 g/dL (ref 6.5–8.1)

## 2015-12-15 LAB — GLUCOSE, CAPILLARY
Glucose-Capillary: 249 mg/dL — ABNORMAL HIGH (ref 65–99)
Glucose-Capillary: 80 mg/dL (ref 65–99)

## 2015-12-15 LAB — CBC
HEMATOCRIT: 40.1 % (ref 39.0–52.0)
Hemoglobin: 13.5 g/dL (ref 13.0–17.0)
MCH: 28.1 pg (ref 26.0–34.0)
MCHC: 33.7 g/dL (ref 30.0–36.0)
MCV: 83.4 fL (ref 78.0–100.0)
Platelets: 372 10*3/uL (ref 150–400)
RBC: 4.81 MIL/uL (ref 4.22–5.81)
RDW: 12.7 % (ref 11.5–15.5)
WBC: 9.4 10*3/uL (ref 4.0–10.5)

## 2015-12-15 LAB — CBG MONITORING, ED
GLUCOSE-CAPILLARY: 79 mg/dL (ref 65–99)
Glucose-Capillary: 155 mg/dL — ABNORMAL HIGH (ref 65–99)

## 2015-12-15 LAB — RAPID URINE DRUG SCREEN, HOSP PERFORMED
AMPHETAMINES: NOT DETECTED
BARBITURATES: NOT DETECTED
BENZODIAZEPINES: NOT DETECTED
Cocaine: POSITIVE — AB
Opiates: NOT DETECTED
Tetrahydrocannabinol: NOT DETECTED

## 2015-12-15 LAB — ETHANOL: Alcohol, Ethyl (B): 55 mg/dL — ABNORMAL HIGH (ref <5)

## 2015-12-15 LAB — SALICYLATE LEVEL: Salicylate Lvl: <4 mg/dL (ref 2.8–30.0)

## 2015-12-15 LAB — ACETAMINOPHEN LEVEL: Acetaminophen (Tylenol), Serum: <10 ug/mL — ABNORMAL LOW (ref 10–30)

## 2015-12-15 MED ORDER — IBUPROFEN 200 MG PO TABS: 600.0000 mg | ORAL_TABLET | Freq: Three times a day (TID) | ORAL | Status: DC | PRN

## 2015-12-15 MED ORDER — ONDANSETRON HCL 4 MG PO TABS: 4.0000 mg | ORAL_TABLET | Freq: Three times a day (TID) | ORAL | Status: DC | PRN

## 2015-12-15 MED ORDER — ESCITALOPRAM OXALATE 10 MG PO TABS
10.0000 mg | ORAL_TABLET | Freq: Every day | ORAL | Status: DC
  Administered 2015-12-16 – 2015-12-19 (×4): 10 mg via ORAL
  Filled 2015-12-15 (×6): qty 1

## 2015-12-15 MED ORDER — INSULIN ASPART 100 UNIT/ML ~~LOC~~ SOLN
4.0000 [IU] | Freq: Three times a day (TID) | SUBCUTANEOUS | Status: DC
  Administered 2015-12-16 – 2015-12-19 (×10): 4 [IU] via SUBCUTANEOUS

## 2015-12-15 MED ORDER — INSULIN ASPART 100 UNIT/ML ~~LOC~~ SOLN
0.0000 [IU] | Freq: Three times a day (TID) | SUBCUTANEOUS | Status: DC
  Administered 2015-12-15: 3 [IU] via SUBCUTANEOUS
  Filled 2015-12-15: qty 1

## 2015-12-15 MED ORDER — RISPERIDONE 0.5 MG PO TABS
0.5000 mg | ORAL_TABLET | Freq: Two times a day (BID) | ORAL | Status: DC
  Administered 2015-12-15: 0.5 mg via ORAL
  Filled 2015-12-15: qty 1

## 2015-12-15 MED ORDER — INSULIN ASPART 100 UNIT/ML ~~LOC~~ SOLN: 4.0000 [IU] | Freq: Three times a day (TID) | SUBCUTANEOUS | Status: DC

## 2015-12-15 MED ORDER — ESCITALOPRAM OXALATE 10 MG PO TABS
10.0000 mg | ORAL_TABLET | Freq: Every day | ORAL | Status: DC
  Administered 2015-12-15: 10 mg via ORAL
  Filled 2015-12-15: qty 1

## 2015-12-15 MED ORDER — ZOLPIDEM TARTRATE 5 MG PO TABS: 5.0000 mg | ORAL_TABLET | Freq: Every evening | ORAL | Status: DC | PRN

## 2015-12-15 MED ORDER — MAGNESIUM HYDROXIDE 400 MG/5ML PO SUSP: 30.0000 mL | Freq: Every day | ORAL | Status: DC | PRN

## 2015-12-15 MED ORDER — INSULIN ASPART 100 UNIT/ML ~~LOC~~ SOLN
4.0000 [IU] | Freq: Three times a day (TID) | SUBCUTANEOUS | Status: DC
  Administered 2015-12-15: 4 [IU] via SUBCUTANEOUS
  Filled 2015-12-15: qty 1

## 2015-12-15 MED ORDER — TRAZODONE HCL 50 MG PO TABS: 50.0000 mg | ORAL_TABLET | Freq: Every evening | ORAL | Status: DC | PRN

## 2015-12-15 MED ORDER — ACETAMINOPHEN 325 MG PO TABS: 650.0000 mg | ORAL_TABLET | Freq: Four times a day (QID) | ORAL | Status: DC | PRN

## 2015-12-15 MED ORDER — ACETAMINOPHEN 325 MG PO TABS: 650.0000 mg | ORAL_TABLET | ORAL | Status: DC | PRN

## 2015-12-15 MED ORDER — INSULIN GLARGINE 100 UNIT/ML ~~LOC~~ SOLN
15.0000 [IU] | Freq: Every day | SUBCUTANEOUS | Status: DC
  Administered 2015-12-16 – 2015-12-18 (×3): 15 [IU] via SUBCUTANEOUS

## 2015-12-15 MED ORDER — ALUM & MAG HYDROXIDE-SIMETH 200-200-20 MG/5ML PO SUSP: 30.0000 mL | ORAL | Status: DC | PRN

## 2015-12-15 MED ORDER — INSULIN ASPART 100 UNIT/ML ~~LOC~~ SOLN
0.0000 [IU] | Freq: Three times a day (TID) | SUBCUTANEOUS | Status: DC
  Administered 2015-12-16: 3 [IU] via SUBCUTANEOUS
  Administered 2015-12-16 (×2): 5 [IU] via SUBCUTANEOUS
  Administered 2015-12-17: 15 [IU] via SUBCUTANEOUS
  Administered 2015-12-17: 11 [IU] via SUBCUTANEOUS
  Administered 2015-12-17: 3 [IU] via SUBCUTANEOUS
  Administered 2015-12-18: 8 [IU] via SUBCUTANEOUS
  Administered 2015-12-18: 11 [IU] via SUBCUTANEOUS
  Administered 2015-12-18 – 2015-12-19 (×2): 8 [IU] via SUBCUTANEOUS
  Administered 2015-12-19: 11 [IU] via SUBCUTANEOUS
  Administered 2015-12-19: 5 [IU] via SUBCUTANEOUS
  Administered 2015-12-20: 15 [IU] via SUBCUTANEOUS
  Administered 2015-12-20: 3 [IU] via SUBCUTANEOUS
  Administered 2015-12-20: 5 [IU] via SUBCUTANEOUS

## 2015-12-15 MED ORDER — IBUPROFEN 600 MG PO TABS: 600.0000 mg | ORAL_TABLET | Freq: Three times a day (TID) | ORAL | Status: DC | PRN

## 2015-12-15 MED ORDER — RISPERIDONE 0.5 MG PO TABS
0.5000 mg | ORAL_TABLET | Freq: Two times a day (BID) | ORAL | Status: DC
  Administered 2015-12-16 – 2015-12-21 (×11): 0.5 mg via ORAL
  Filled 2015-12-15: qty 14
  Filled 2015-12-15 (×6): qty 1
  Filled 2015-12-15: qty 14
  Filled 2015-12-15 (×12): qty 1

## 2015-12-15 MED ORDER — INSULIN ASPART 100 UNIT/ML ~~LOC~~ SOLN: 0.0000 [IU] | Freq: Three times a day (TID) | SUBCUTANEOUS | Status: DC

## 2015-12-15 MED ORDER — LORAZEPAM 1 MG PO TABS: 1.0000 mg | ORAL_TABLET | Freq: Three times a day (TID) | ORAL | Status: DC | PRN

## 2015-12-15 MED ORDER — LORAZEPAM 1 MG PO TABS
1.0000 mg | ORAL_TABLET | Freq: Three times a day (TID) | ORAL | Status: DC | PRN
  Filled 2015-12-15: qty 1

## 2015-12-15 MED ORDER — TRAZODONE HCL 50 MG PO TABS
50.0000 mg | ORAL_TABLET | Freq: Every evening | ORAL | Status: DC | PRN
  Filled 2015-12-15: qty 1

## 2015-12-15 MED ORDER — NICOTINE 14 MG/24HR TD PT24
14.0000 mg | MEDICATED_PATCH | Freq: Every day | TRANSDERMAL | Status: DC
  Administered 2015-12-18 – 2015-12-19 (×2): 14 mg via TRANSDERMAL
  Filled 2015-12-15 (×9): qty 1

## 2015-12-15 MED ORDER — INSULIN GLARGINE 100 UNIT/ML ~~LOC~~ SOLN
15.0000 [IU] | Freq: Every day | SUBCUTANEOUS | Status: DC
  Filled 2015-12-15: qty 0.15

## 2015-12-15 MED ORDER — NICOTINE 14 MG/24HR TD PT24
14.0000 mg | MEDICATED_PATCH | Freq: Every day | TRANSDERMAL | Status: DC
  Filled 2015-12-15: qty 1
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
## 2015-12-15 NOTE — Progress Notes (Addendum)
CM spoke with pt who [place].  CM discussed and provided written information to assist pt with determining choice for uninsured accepting pcps, discussed the importance of pcp vs EDP services for f/u care, www.needymeds.org, www.goodrx.com, discounted pharmacies and other [place] resources such as [facility] , [facility], affordable care act, financial assistance, uninsured dental services, [place] med assist, DSS and  health department  Reviewed resources for [place] uninsured accepting pcps like [facility], family medicine at [address], [facility], [facility], local urgent care centers, [facility], [facility], general medical clinics, [facility] of [facility] [place], [facility] urgent care plus others, medication resources, [facility] out patient pharmacies and housing Pt voiced understanding and appreciation of resources provided   Provided P4CC contact information   Entered in d/c instructions

## 2015-12-15 NOTE — Progress Notes (Addendum)
Pt is lying in the bed and has a flat , blunted affect. Pt does appear depressed and stated right now he does not feel SI and contracts for safety. Pt does remain with a sitter. He only ate sausage for breakfast. 12noon -Pts FSBS 79. Pt given cheese and crackers and some gatroade. 12 noon insulin dose held. 12:30pm Rechecked pts FSBS after eatign cheese and crackers and his sugar is now 155. 1:15PM-[facility]. - Unable to take report will phone back. (1:22pm )1:50pm Report given to [place]. [facility] 3:30[facility] . Per [facility] they can pick the pt up after change of shift which will be 4:15pm. Report to oncoming shift. (3:25pm )

## 2015-12-15 NOTE — ED Notes (Signed)
Pt states he needs help and if he does not get any he will end up killing himself  Pt states he has bipolar and major depressive disorder and has not been on his medications in a long time  Pt is homeless

## 2015-12-15 NOTE — ED Provider Notes (Signed)
CSN: [id number]     Arrival date & time 12/15/15  0405 History   First MD Initiated Contact with Patient 12/15/15 [phone]     Chief Complaint  Patient presents with  . Suicidal     (Consider location/radiation/quality/duration/timing/severity/associated sxs/prior Treatment) The history is provided by the patient.  50 year old male with long-standing history of drug and alcohol abuse and depression states that he was discharged from behavioral health 2 days ago and immediately hooked up with old friends and resumed drinking and cocaine use. He presents asking for help with these. He also relates that he has had suicidal thoughts of jumping in front of a car. He admits to auditory hallucinations with voices telling him to kill himself. He states [facility] but relapses almost immediately upon returning to the street.  Past Medical History  Diagnosis Date  . Suicidal ideations   . Diabetes mellitus without complication (HCC)   . Alcohol abuse   . Major depressive disorder (HCC)   . Bipolar disorder (HCC)    History reviewed. No pertinent past surgical history. History reviewed. No pertinent family history. Social History  Substance Use Topics  . Smoking status: Current Every Day Smoker -- 0.00 packs/day    Types: Cigarettes  . Smokeless tobacco: None  . Alcohol Use: Yes     Comment: "as muck as I can consume"    Review of Systems  All other systems reviewed and are negative.     Allergies  Review of patient's allergies indicates no known allergies.  Home Medications   Prior to Admission medications   Medication Sig Start Date End Date Taking? Authorizing Provider  escitalopram (LEXAPRO) 10 MG tablet Take 1 tablet (10 mg total) by mouth daily. For depression 12/13/15  Yes Beau FannyJohn C Withrow, FNP  insulin glargine (LANTUS) 100 UNIT/ML injection Inject 0.15 mLs (15 Units total) into the skin at bedtime. For diabetes management 10/31/15   Yes Sanjuana KavaAgnes I Nwoko, NP  risperiDONE (RISPERDAL) 0.5 MG tablet Take 1 tablet (0.5 mg total) by mouth 2 (two) times daily. 12/13/15  Yes Beau FannyJohn C Withrow, FNP  traZODone (DESYREL) 50 MG tablet Take 1 tablet (50 mg total) by mouth at bedtime. For sleep Patient taking differently: Take 50 mg by mouth at bedtime as needed for sleep. For sleep 12/13/15  Yes John C Withrow, FNP   BP 142/83 mmHg  Pulse 91  Temp(Src) 97.6 F (36.4 C) (Oral)  Resp 18  SpO2 97% Physical Exam  Nursing note and vitals reviewed.  50 year old male, resting comfortably and in no acute distress. Vital signs are significant for borderline hypertension. Oxygen saturation is 97%, which is normal. Head is normocephalic and atraumatic. PERRLA, EOMI. Oropharynx is clear. Neck is nontender and supple without adenopathy or JVD. Back is nontender and there is no CVA tenderness. Lungs are clear without rales, wheezes, or rhonchi. Chest is nontender. Heart has regular rate and rhythm without murmur. Abdomen is soft, flat, nontender without masses or hepatosplenomegaly and peristalsis is normoactive. Extremities have no cyanosis or edema, full range of motion is present. Skin is warm and dry without rash. Neurologic: He is awake and alert and oriented although does appear moderately depressed, cranial nerves are intact, there are no motor or sensory deficits.  ED Course  Procedures (including critical care time) Labs Review Results for orders placed or performed during the hospital encounter of 12/15/15  Comprehensive metabolic panel  Result Value Ref Range  Sodium 142 135 - 145 mmol/L   Potassium 3.8 3.5 - 5.1 mmol/L   Chloride 102 101 - 111 mmol/L   CO2 27 22 - 32 mmol/L   Glucose, Bld 235 (H) 65 - 99 mg/dL   BUN 6 6 - 20 mg/dL   Creatinine, Ser 1.61 0.61 - 1.24 mg/dL   Calcium 9.2 8.9 - 09.6 mg/dL   Total Protein 7.3 6.5 - 8.1 g/dL   Albumin 3.9 3.5 - 5.0 g/dL   AST 16 15 - 41 U/L   ALT 13 (L) 17 - 63 U/L   Alkaline  Phosphatase 73 38 - 126 U/L   Total Bilirubin 0.3 0.3 - 1.2 mg/dL   GFR calc non Af Amer >60 >60 mL/min   GFR calc Af Amer >60 >60 mL/min   Anion gap 13 5 - 15  Ethanol  Result Value Ref Range   Alcohol, Ethyl (B) 55 (H) <5 mg/dL  Salicylate level  Result Value Ref Range   Salicylate Lvl <4.0 2.8 - 30.0 mg/dL  Acetaminophen level  Result Value Ref Range   Acetaminophen (Tylenol), Serum <10 (L) 10 - 30 ug/mL  cbc  Result Value Ref Range   WBC 9.4 4.0 - 10.5 K/uL   RBC 4.81 4.22 - 5.81 MIL/uL   Hemoglobin 13.5 13.0 - 17.0 g/dL   HCT 04.5 40.9 - 81.1 %   MCV 83.4 78.0 - 100.0 fL   MCH 28.1 26.0 - 34.0 pg   MCHC 33.7 30.0 - 36.0 g/dL   RDW 91.4 78.2 - 95.6 %   Platelets 372 150 - 400 K/uL  Rapid urine drug screen (hospital performed)  Result Value Ref Range   Opiates NONE DETECTED NONE DETECTED   Cocaine POSITIVE (A) NONE DETECTED   Benzodiazepines NONE DETECTED NONE DETECTED   Amphetamines NONE DETECTED NONE DETECTED   Tetrahydrocannabinol NONE DETECTED NONE DETECTED   Barbiturates NONE DETECTED NONE DETECTED   I have personally reviewed and evaluated these images and lab results as part of my medical decision-making.   MDM   Final diagnoses:  MDD (major depressive disorder), recurrent severe, without psychosis (HCC)  Suicidal ideation  Cocaine abuse  Alcohol abuse    Depression with suicidal ideation superimposed on alcohol and cocaine abuse. Old records are reviewed confirming recent discharge from [facility]. He has been admitted therefore times since November. Currently, he is not showing any evidence of alcohol at all. TTS consultation will be obtained.    Dione Booze, MD 12/15/15 [phone]

## 2015-12-15 NOTE — ED Notes (Signed)
[facility] Patient ambulatory with steady gait upon transfer.
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
## 2015-12-15 NOTE — BH Assessment (Signed)
Pt meets inpatient criteria per Maryjean Mornharles Kober, PA-C. TTS to seek placement.

## 2015-12-15 NOTE — BH Assessment (Signed)
[facility] Assessment Progress Note  Per Thedore MinsMojeed Akintayo, MD, this pt requires psychiatric hospitalization at this time.  Lillia AbedLindsay, RN, [facility].  Pt has signed Voluntary Admission and Consent for Treatment, as well as Consent to Release Information to no one, and signed [facility].  Pt's nurse, Marylu LundJanet, has been notified, and agrees to send original paperwork along with pt via [facility], and to call report to [phone].  Doylene Canninghomas Aries Townley, MA Triage Specialist [phone]

## 2015-12-15 NOTE — ED Notes (Signed)
MD at bedside. 

## 2015-12-15 NOTE — Tx Team (Signed)
Initial Interdisciplinary Treatment Plan   PATIENT STRESSORS: Financial difficulties Medication change or noncompliance Substance abuse   PATIENT STRENGTHS: Ability for insight Communication skills Motivation for treatment/growth   PROBLEM LIST: Problem List/Patient Goals Date to be addressed Date deferred Reason deferred Estimated date of resolution  At risk for suicide 12/15/2015  12/15/2015   D/C  Depression 12/15/2015  12/15/2015   D/C  Substance Abuse 12/15/2015  12/15/2015   D/C  "Staying on medication" 12/15/2015  12/15/2015   D/C  "Going to a long substance abuse program to get help" 12/15/2015  12/15/2015   D/C                           DISCHARGE CRITERIA:  Ability to meet basic life and health needs Adequate post-discharge living arrangements Improved stabilization in mood, thinking, and/or behavior Medical problems require only outpatient monitoring Motivation to continue treatment in a less acute level of care Need for constant or close observation no longer present Reduction of life-threatening or endangering symptoms to within safe limits Safe-care adequate arrangements made Verbal commitment to aftercare and medication compliance Withdrawal symptoms are absent or subacute and managed without 24-hour nursing intervention  PRELIMINARY DISCHARGE PLAN: Attend 12-step recovery group Outpatient therapy Placement in alternative living arrangements  PATIENT/FAMIILY INVOLVEMENT: This treatment plan has been presented to and reviewed with the patient, [name] Kennedy.  The patient and family have been given the opportunity to ask questions and make suggestions.  [name] Kennedy, [name] Kennedy 12/15/2015, 6:31 PM

## 2015-12-15 NOTE — ED Notes (Signed)
Pt has in belonging bag:  Grey tennis shoes, blue jeans, black belt, white t-shirt, strip black and white shirt, white socks, black charger, black phone, and black lighter.

## 2015-12-16 DIAGNOSIS — F1424 Cocaine dependence with cocaine-induced mood disorder: Secondary | ICD-10-CM

## 2015-12-16 DIAGNOSIS — R45851 Suicidal ideations: Secondary | ICD-10-CM

## 2015-12-16 DIAGNOSIS — F332 Major depressive disorder, recurrent severe without psychotic features: Principal | ICD-10-CM

## 2015-12-16 DIAGNOSIS — F1099 Alcohol use, unspecified with unspecified alcohol-induced disorder: Secondary | ICD-10-CM

## 2015-12-16 DIAGNOSIS — F1090 Alcohol use, unspecified, uncomplicated: Secondary | ICD-10-CM | POA: Diagnosis present

## 2015-12-16 DIAGNOSIS — IMO0002 Reserved for concepts with insufficient information to code with codable children: Secondary | ICD-10-CM | POA: Diagnosis present

## 2015-12-16 LAB — GLUCOSE, CAPILLARY
GLUCOSE-CAPILLARY: 249 mg/dL — AB (ref 65–99)
Glucose-Capillary: 190 mg/dL — ABNORMAL HIGH (ref 65–99)
Glucose-Capillary: 243 mg/dL — ABNORMAL HIGH (ref 65–99)
Glucose-Capillary: 272 mg/dL — ABNORMAL HIGH (ref 65–99)

## 2015-12-16 MED ORDER — CHLORDIAZEPOXIDE HCL 25 MG PO CAPS
25.0000 mg | ORAL_CAPSULE | Freq: Every day | ORAL | Status: AC
  Administered 2015-12-19: 25 mg via ORAL
  Filled 2015-12-16: qty 1

## 2015-12-16 MED ORDER — CHLORDIAZEPOXIDE HCL 25 MG PO CAPS
25.0000 mg | ORAL_CAPSULE | ORAL | Status: AC
  Administered 2015-12-18 (×2): 25 mg via ORAL
  Filled 2015-12-16 (×2): qty 1

## 2015-12-16 MED ORDER — THIAMINE HCL 100 MG/ML IJ SOLN
100.0000 mg | Freq: Once | INTRAMUSCULAR | Status: AC
  Administered 2015-12-16: 100 mg via INTRAMUSCULAR
  Filled 2015-12-16: qty 2

## 2015-12-16 MED ORDER — LOPERAMIDE HCL 2 MG PO CAPS: 2.0000 mg | ORAL_CAPSULE | ORAL | Status: AC | PRN

## 2015-12-16 MED ORDER — HYDROXYZINE HCL 25 MG PO TABS
25.0000 mg | ORAL_TABLET | Freq: Four times a day (QID) | ORAL | Status: AC | PRN
  Filled 2015-12-16: qty 1

## 2015-12-16 MED ORDER — VITAMIN B-1 100 MG PO TABS
100.0000 mg | ORAL_TABLET | Freq: Every day | ORAL | Status: DC
  Administered 2015-12-17 – 2015-12-21 (×5): 100 mg via ORAL
  Filled 2015-12-16 (×8): qty 1

## 2015-12-16 MED ORDER — CHLORDIAZEPOXIDE HCL 25 MG PO CAPS
25.0000 mg | ORAL_CAPSULE | Freq: Four times a day (QID) | ORAL | Status: AC
  Administered 2015-12-16 (×3): 25 mg via ORAL
  Filled 2015-12-16 (×3): qty 1

## 2015-12-16 MED ORDER — ADULT MULTIVITAMIN W/MINERALS CH
1.0000 | ORAL_TABLET | Freq: Every day | ORAL | Status: DC
  Administered 2015-12-16 – 2015-12-21 (×6): 1 via ORAL
  Filled 2015-12-16 (×10): qty 1

## 2015-12-16 MED ORDER — CHLORDIAZEPOXIDE HCL 25 MG PO CAPS
25.0000 mg | ORAL_CAPSULE | Freq: Three times a day (TID) | ORAL | Status: AC
  Administered 2015-12-17 (×3): 25 mg via ORAL
  Filled 2015-12-16 (×3): qty 1

## 2015-12-16 MED ORDER — CHLORDIAZEPOXIDE HCL 25 MG PO CAPS
25.0000 mg | ORAL_CAPSULE | Freq: Four times a day (QID) | ORAL | Status: AC | PRN
  Filled 2015-12-16: qty 1

## 2015-12-16 MED ORDER — ONDANSETRON 4 MG PO TBDP: 4.0000 mg | ORAL_TABLET | Freq: Four times a day (QID) | ORAL | Status: AC | PRN

## 2015-12-16 MED ORDER — TRAZODONE HCL 50 MG PO TABS
50.0000 mg | ORAL_TABLET | Freq: Every day | ORAL | Status: DC
  Administered 2015-12-16 – 2015-12-21 (×3): 50 mg via ORAL
  Filled 2015-12-16 (×5): qty 1
  Filled 2015-12-16: qty 7
  Filled 2015-12-16 (×3): qty 1

## 2015-12-16 NOTE — Progress Notes (Signed)
D.  Pt flat but pleasant on approach, no complaints voiced.  Pt observed in dayroom eating snack, minimal interaction on unit.  Pt went to bed directly after med pass.  Pt denies SI/HI/hallucinations at this time.  A.  Support and encouragement offered, medications given as ordered  R.  Pt remains safe on the unit, will continue to monitor.

## 2015-12-16 NOTE — [facility] Group Notes (Signed)
[facility] Group Notes:  (Nursing/MHT/Case Management/Adjunct)  Date:  12/16/2015  Time:  0900  Type of Therapy:  Nurse Education  Participation Level:  Did Not Attend  Summary of Progress/Problems: Pt was invited but did not attend.   Maurine SimmeringShugart, Domenique Southers M 12/16/2015, 10:38 AM

## 2015-12-16 NOTE — Plan of Care (Signed)
Problem: Consults Goal: Depression Patient Education See Patient Education Module for education specifics.  Outcome: Progressing Nurse discussed depression/coping skills with patient.        

## 2015-12-16 NOTE — Progress Notes (Signed)
D:  Patient's self inventory sheet, patient sleeps good, no sleep medication given.  Good appetite, normal energy level, poor concentration.  Rated depression, hopeless and anxiety.  Has experienced sweating , tremors.  Denied SI while talking to nurse.  Denied pain.  Goal is to get better.  Plans to attend groups.   A:  Medications administered per MD orders.  Emotional support and encouragement given patient. R:  Patient denied SI and HI, contracts for safety.  Denied A/V hallucinations.  Safety maintained with 15 minutes checks.

## 2015-12-16 NOTE — H&P (Signed)
Psychiatric Admission Assessment Adult  Patient Identification: [name] Kennedy  MRN:  [id number]  Date of Evaluation:  12/16/2015  Chief Complaint:  MDD RECUREENT EPISODE MODERATE COCAINE USE DISORDER MODERATE ALCOHOL USE DISORDER MODERATE  Principal Diagnosis: Cocaine dependence with cocaine-induced mood disorder (HCC), Alcohol use disorder, dependence,   Diagnosis:   Patient Active Problem List   Diagnosis Date Noted  . Alcohol dependence (HCC) [F10.20] 01/24/2013    Priority: High  . Cocaine dependence (HCC) [F14.20] 01/24/2013    Priority: Medium  . MDD (major depressive disorder) (HCC) [F32.9] 12/15/2015  . MDD (major depressive disorder), recurrent severe, without psychosis (HCC) [F33.2] 12/13/2015  . Cocaine-induced mood disorder (HCC) [F14.94] 12/12/2015  . Alcohol dependence with uncomplicated withdrawal (HCC) [F10.230]   . Insomnia [G47.00]   . 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 Cocaine abuse [F14.10] 10/24/2015  . Alcohol-induced mood disorder (HCC) [F10.94] 06/26/2015  . Substance induced mood disorder (HCC) [F19.94] 06/26/2015  . Uncomplicated alcohol dependence (HCC) [F10.20]   . Suicidal ideation [R45.851] 01/23/2013   History of Present Illness: This is an admission assessment for this 50 year old African-American male. Admitted to the [facility] adult unit from the [facility] with complaints of Suicidal ideations. He was admitted to the hospital with a BAL of 55 & UDS positive for Cocaine. During this assessment, [name] Kennedy reports, "I have been homeless for many years. I have been feeling like killing myself for many years as well. I have drinking & drugging problems for years. I have been off of my medicines for my mental illness for about 1 month. I don't remember the medicines that I was taking. The medicines are for depression & hearing voices. I have heard voices for over 45 years. I drink & do cocaine to quiet the  voices in my head. I drink as much as I can & what ever alcohol I can get. I have no psychiatrist out there. And because I'm homeless, I don't normally have a stable place to stay, follow-up care & keep my medicines. I need help. The voices in my head are telling I'm no good".  Associated Signs/Symptoms:  Depression Symptoms:  depressed mood,  (Hypo) Manic Symptoms:  Hallucinations,  Anxiety Symptoms:  none  Psychotic Symptoms:  Hallucinations: Auditory  PTSD Symptoms: Negative  Total Time spent with patient: 1 hour  Past Psychiatric History: substance abuse  Is the patient at risk to self? No.  Has the patient been a risk to self in the past 6 months? No.  Has the patient been a risk to self within the distant past? Yes.    Is the patient a risk to others? No.  Has the patient been a risk to others in the past 6 months? No.  Has the patient been a risk to others within the distant past? No.   Prior Inpatient Therapy:  [facility] Prior Outpatient Therapy:  [facility]  Alcohol Screening: 1. How often do you have a drink containing alcohol?: 4 or more times a week 2. How many drinks containing alcohol do you have on a typical day when you are drinking?: 10 or more 3. How often do you have six or more drinks on one occasion?: Daily or almost daily Preliminary Score: 8 4. How often during the last year have you found that you were not able to stop drinking once you had started?: Daily or almost daily 5. How often during the last year have you failed to do what  was normally expected from you becasue of drinking?: Daily or almost daily 6. How often during the last year have you needed a first drink in the morning to get yourself going after a heavy drinking session?: Daily or almost daily 7. How often during the last year have you had a feeling of guilt of remorse after drinking?: Daily or almost daily 8. How often during the last year have you been unable to remember what happened the night  before because you had been drinking?: Daily or almost daily 9. Have you or someone else been injured as a result of your drinking?: No 10. Has a relative or friend or a doctor or another health worker been concerned about your drinking or suggested you cut down?: Yes, during the last year Alcohol Use Disorder Identification Test Final Score (AUDIT): 36 Brief Intervention: YesFrequently, amount varies  Substance Abuse History in the last 12 months:  Yes.    Consequences of Substance Abuse: Medical Consequences:  Liver damage, Possible death by overdose Legal Consequences:  Arrests, jail time, Loss of driving privilege. Family Consequences:  Family discord, divorce and or separation.  Previous Psychotropic Medications: Yes   Psychological Evaluations: Yes   Past Medical History:  Past Medical History  Diagnosis Date  . Suicidal ideations   . Diabetes mellitus without complication (HCC)   . Alcohol abuse   . Major depressive disorder (HCC)   . Bipolar disorder (HCC)    History reviewed. No pertinent past surgical history.  Family History: History reviewed. No pertinent family history.  Family Psychiatric  History: substance abuse  Tobacco Screening: None  Social History:  History  Alcohol Use  . Yes    Comment: "as muck as I can consume"     History  Drug Use  . Yes  . Special: Cocaine    Comment: last used 12/14/15    Additional Social History:  Allergies:  No Known Allergies Lab Results:  Results for orders placed or performed during the hospital encounter of 12/15/15 (from the past 48 hour(s))  Glucose, capillary     Status: Abnormal   Collection Time: 12/15/15  4:56 PM  Result Value Ref Range   Glucose-Capillary 249 (H) 65 - 99 mg/dL  Glucose, capillary     Status: None   Collection Time: 12/15/15  9:02 PM  Result Value Ref Range   Glucose-Capillary 80 65 - 99 mg/dL  Glucose, capillary     Status: Abnormal   Collection Time: 12/16/15  5:46 AM  Result Value  Ref Range   Glucose-Capillary 190 (H) 65 - 99 mg/dL   Comment 1 Notify RN    Blood Alcohol level:  Lab Results  Component Value Date   ETH 55* 12/15/2015   ETH 87* 12/12/2015   Metabolic Disorder Labs:  No results found for: HGBA1C, MPG No results found for: PROLACTIN No results found for: CHOL, TRIG, HDL, CHOLHDL, VLDL, LDLCALC  Current Medications: Current Facility-Administered Medications  Medication Dose Route Frequency Provider Last Rate Last Dose  . acetaminophen (TYLENOL) tablet 650 mg  650 mg Oral Q6H PRN Thermon Leyland, NP      . alum & mag hydroxide-simeth (MAALOX/MYLANTA) 200-200-20 MG/5ML suspension 30 mL  30 mL Oral Q4H PRN Thermon Leyland, NP      . escitalopram (LEXAPRO) tablet 10 mg  10 mg Oral Daily Thermon Leyland, NP   10 mg at 12/16/15 0817  . ibuprofen (ADVIL,MOTRIN) tablet 600 mg  600 mg Oral Q8H PRN Vernona Rieger  Jenness Corner, NP      . insulin aspart (novoLOG) injection 0-15 Units  0-15 Units Subcutaneous TID WC Rachael Fee, MD   3 Units at 12/16/15 [phone]  . insulin aspart (novoLOG) injection 4 Units  4 Units Subcutaneous TID WC Rachael Fee, MD   4 Units at 12/16/15 0602  . insulin glargine (LANTUS) injection 15 Units  15 Units Subcutaneous QHS Thermon Leyland, NP   Stopped at 12/15/15 2148  . LORazepam (ATIVAN) tablet 1 mg  1 mg Oral Q8H PRN Thermon Leyland, NP      . magnesium hydroxide (MILK OF MAGNESIA) suspension 30 mL  30 mL Oral Daily PRN Thermon Leyland, NP      . nicotine (NICODERM CQ - dosed in mg/24 hours) patch 14 mg  14 mg Transdermal Daily Thermon Leyland, NP   14 mg at 12/16/15 0817  . ondansetron (ZOFRAN) tablet 4 mg  4 mg Oral Q8H PRN Thermon Leyland, NP      . risperiDONE (RISPERDAL) tablet 0.5 mg  0.5 mg Oral BID Thermon Leyland, NP   0.5 mg at 12/16/15 0817  . traZODone (DESYREL) tablet 50 mg  50 mg Oral QHS PRN Thermon Leyland, NP       PTA Medications: Prescriptions prior to admission  Medication Sig Dispense Refill Last Dose  . escitalopram (LEXAPRO) 10 MG tablet  Take 1 tablet (10 mg total) by mouth daily. For depression 14 tablet 0 12/13/2015  . insulin glargine (LANTUS) 100 UNIT/ML injection Inject 0.15 mLs (15 Units total) into the skin at bedtime. For diabetes management 10 mL 0 12/13/2015  . risperiDONE (RISPERDAL) 0.5 MG tablet Take 1 tablet (0.5 mg total) by mouth 2 (two) times daily. 28 tablet 0 12/13/2015  . traZODone (DESYREL) 50 MG tablet Take 1 tablet (50 mg total) by mouth at bedtime. For sleep (Patient taking differently: Take 50 mg by mouth at bedtime as needed for sleep. For sleep) 14 tablet 0 unknown   Musculoskeletal: Strength & Muscle Tone: within normal limits Gait & Station: normal Patient leans: N/A  Psychiatric Specialty Exam: Physical Exam  Constitutional: He is oriented to person, place, and time. He appears well-developed and well-nourished.  HENT:  Head: Normocephalic.  Eyes: Pupils are equal, round, and reactive to light.  Neck: Normal range of motion.  Cardiovascular: Normal rate.   Respiratory: Effort normal.  GI: Soft.  Genitourinary:  Denies any issues in this area  Musculoskeletal: Normal range of motion.  Neurological: He is alert and oriented to person, place, and time.  Skin: Skin is warm and dry.  Psychiatric: His speech is normal. His mood appears anxious. His affect is not angry, not blunt, not labile and not inappropriate. He is actively hallucinating (Auditory hallucinations). Thought content is paranoid. He expresses impulsivity. He exhibits a depressed mood.    Review of Systems  Constitutional: Positive for malaise/fatigue.  HENT: Negative.   Eyes: Negative.   Respiratory: Negative.   Cardiovascular: Negative.   Gastrointestinal: Negative.   Genitourinary: Negative.   Musculoskeletal: Negative.   Skin: Negative.   Neurological: Positive for dizziness and weakness.  Endo/Heme/Allergies: Negative.   Psychiatric/Behavioral: Positive for depression, suicidal ideas (Denies any intent or plans to hurt  himself) and substance abuse (Alcohol dependence, chronic, cocaine dependence). Negative for hallucinations and memory loss. The patient is nervous/anxious and has insomnia.     Blood pressure 109/57, pulse 79, temperature 98.7 F (37.1 C), temperature source Oral, resp. rate  16, height 5\' 7"  (1.702 m), weight 68.493 kg (151 lb), SpO2 97 %.Body mass index is 23.64 kg/(m^2).  General Appearance: Casual  Eye Contact::  Good  Speech:  Normal Rate  Volume:  Normal  Mood:  Depressed and Irritable  Affect:  Congruent and Flat  Thought Process:  Coherent  Orientation:  Full (Time, Place, and Person)  Thought Content:  Hallucinations: Auditory  Suicidal Thoughts:  Yes.  without intent/plan  Homicidal Thoughts:  No  Memory:  Immediate;   Good Recent;   Good Remote;   Good  Judgement:  Fair  Insight:  Fair  Psychomotor Activity:  Normal  Concentration:  Good  Recall:  Good  Fund of Knowledge:Good  Language: Good  Akathisia:  No  Handed:  Right  AIMS (if indicated):     Assets:  Communication Skills Desire for Improvement  ADL's:  Intact  Cognition: WNL  Sleep:  Number of Hours: 6.5   Treatment Plan Summary: Daily contact with patient to assess and evaluate symptoms and progress in treatment and Medication management: Treatment Plan/Recommendations: 1. Admit for crisis management and stabilization, estimated length of stay 3-5 days.  2. Medication management to reduce current symptoms to base line and improve the patient's overall level of functioning; Initiate Librium detox protocols, Lexapro 10 mg for depression, Risperdal 0.5 mg for mood control & Trazodone 50 mg for insomnia, Resume Lantus insulin 15 units  3. Treat health problems as indicated.  4. Develop treatment plan to decrease risk of relapse upon discharge and the need for readmission.  5. Psycho-social education regarding relapse prevention and self care.  6. Health care follow up as needed for medical problems.  7. Review,  reconcile, and reinstate any pertinent home medications for other health issues where appropriate. 8. Call for consults with hospitalist for any additional specialty patient care services as needed.  Observation Level/Precautions:  15 minute checks  Laboratory:  Completed in the ED and reviewed  Psychotherapy:  Individual counseling  Medications:  Risperdal 0.5 mg BID for mood control, Lexapro 10 mg for depression, Trazodone 100 mg at bedtime for sleep, Librium detox protocols, resumed Lantus 15 units for diabetes.  Consultations: As needed  Discharge Concerns: Safety, sobriety  Estimated LOS: 2-4 days  Other: Admit to [facility]    Armandina StammerNwoko, Agnes I, NP, PMHNP-BC 5/13/20179:07 AM I have examined the patient and agreed with the findings of H&P and treatment plan. I have also done suicide assessment on this patient.

## 2015-12-16 NOTE — [facility] Group Notes (Signed)
[facility] LCSW Group Therapy Note  12/16/2015 10:00 AM  Type of Therapy and Topic:  Group Therapy: Avoiding Self-Sabotaging and Enabling Behaviors  Participation Level:  Did Not Attend although encouraged to do so by MHT and group facilitator  Summary of Progress/Problems:  The main focus of today's process group was for the patient to identify ways in which they have in the past sabotaged their own recovery. Motivational Interviewing was utilized to identify motivation they may have for wanting to change. The Stages of Change were explained using a handout, and patients identified where they currently are with regard to stages of change.    Carney Bernatherine C Harrill, LCSW
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
## 2015-12-17 LAB — GLUCOSE, CAPILLARY
GLUCOSE-CAPILLARY: 333 mg/dL — AB (ref 65–99)
GLUCOSE-CAPILLARY: 200 mg/dL — AB (ref 65–99)
GLUCOSE-CAPILLARY: 177 mg/dL — AB (ref 65–99)
Glucose-Capillary: 432 mg/dL — ABNORMAL HIGH (ref 65–99)
Glucose-Capillary: 263 mg/dL — ABNORMAL HIGH (ref 65–99)

## 2015-12-17 NOTE — Progress Notes (Signed)
D. Pt flat but pleasant on approach, denies complaints att this time.  Pt did not attend evening AA group, minimal interaction on unit.  Pt denies SI/HI/hallucinations at this time.  A  Support and encouragement offered, medication given as ordered  R.  Pt remains safe on the unit, will continue to monitor.

## 2015-12-17 NOTE — Progress Notes (Signed)
D:  Patient's self inventory sheet, patient sleeps good, sleep medication was given.  Fair appetite, low energy level, poor concentration.  Rated depression, hopeless and anxiety #10.  Denied SI.  Denied physical problems.  Denied pain.  Goal is to feel better, rest. A:  Medications administered per MD orders.  Emotional support and encouragement given patient. R:  Denied SI and HI, contracts for safety.  Denied A/V hallucinations.  Safety maintained with 15 minute checks.

## 2015-12-17 NOTE — Plan of Care (Signed)
Problem: Alteration in mood & ability to function due to Goal: STG-Patient will attend groups Outcome: Not Progressing Pt has not attended evening groups this weekend

## 2015-12-17 NOTE — [facility] Group Notes (Signed)
[facility] LCSW Group Therapy  12/17/2015 10:00 AM  Type of Therapy:  Group Therapy  Participation Level:  Did Not Attend   Beverly SessionsLINDSEY, Irene Mitcham J 12/17/2015, 3:37 PM

## 2015-12-17 NOTE — [facility] Suicide Risk Assessment (Signed)
[facility] INPATIENT:  Family/Significant Other Suicide Prevention Education  Suicide Prevention Education:  Patient Refusal for Family/Significant Other Suicide Prevention Education: The patient [name] Kennedy has refused to provide written consent for family/significant other to be provided Family/Significant Other Suicide Prevention Education during admission and/or prior to discharge.  Physician notified.  Writer provided suicide prevention education directly to patient following PSA discussion. Conversation included risk factors, warning signs and resources to contact for help. Mobile crisis services explained and  explanations given as to resources which will be included on patient's discharge paperwork.   Clide DalesHarrill, Catherine Campbell 12/17/2015, 6:15 PM

## 2015-12-17 NOTE — Plan of Care (Signed)
Problem: Consults Goal: Suicide Risk Patient Education (See Patient Education module for education specifics)  Outcome: Progressing Nurse discussed suicidal thoughts/ depression/ coping skills with patient.        

## 2015-12-17 NOTE — Progress Notes (Signed)
Patient did not attend AA group meeting. 

## 2015-12-17 NOTE — [facility] Notes (Signed)
[facility]  12/17/2015 12:38 PM [name]  MRN:  [id number]  Subjective: [name] Kennedy reports, "I'm doing the same as I was doing yesterday".  Objective: [name] Kennedy is seen, chart reviewed. He is lying down in his bed during this follow-up visit. He presents with restricted affects. He says he is doing the same today as he was doing yesterday. So far, he is not attending group milieu, however, will get up to the cafeteria for meals. He appears to be in no apparent distress. He is instructed & encouraged to participate in the group milieu.  Principal Problem: Alcohol use disorder, dependence, Cocaine dependence with cocaine-induced mood disorder (HCC)  Diagnosis:   Patient Active Problem List   Diagnosis Date Noted  . Alcohol dependence (HCC) [F10.20] 01/24/2013    Priority: High  . Cocaine dependence (HCC) [F14.20] 01/24/2013    Priority: Medium  . Alcohol use disorder (HCC) [F10.99]   . Cocaine dependence with cocaine-induced mood disorder (HCC) [F14.24]   . MDD (major depressive disorder) (HCC) [F32.9] 12/15/2015  . MDD (major depressive disorder), recurrent severe, without psychosis (HCC) [F33.2] 12/13/2015  . Cocaine-induced mood disorder (HCC) [F14.94] 12/12/2015  . Alcohol dependence with uncomplicated withdrawal (HCC) [F10.230]   . Insomnia [G47.00]   . 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 Cocaine abuse [F14.10] 10/24/2015  . Alcohol-induced mood disorder (HCC) [F10.94] 06/26/2015  . Substance induced mood disorder (HCC) [F19.94] 06/26/2015  . Uncomplicated alcohol dependence (HCC) [F10.20]   . Suicidal ideation [R45.851] 01/23/2013   Total Time spent with patient: 25 minutes  Past Psychiatric History: Hx: Alcogholism, chronic  Past Medical History:  Past Medical History  Diagnosis Date  . Suicidal ideations   . Diabetes mellitus without complication (HCC)   . Alcohol abuse   . Major depressive disorder (HCC)   . Bipolar  disorder (HCC)    History reviewed. No pertinent past surgical history. Family History: History reviewed. No pertinent family history. Family Psychiatric  History: See H&P  Social History:  History  Alcohol Use  . Yes    Comment: "as muck as I can consume"     History  Drug Use  . Yes  . Special: Cocaine    Comment: last used 12/14/15    Social History   Social History  . Marital Status: Single    Spouse Name: N/A  . Number of Children: N/A  . Years of Education: N/A   Social History Main Topics  . Smoking status: Current Every Day Smoker -- 0.00 packs/day    Types: Cigarettes  . Smokeless tobacco: None  . Alcohol Use: Yes     Comment: "as muck as I can consume"  . Drug Use: Yes    Special: Cocaine     Comment: last used 12/14/15  . Sexual Activity: Not Asked   Other Topics Concern  . None   Social History Narrative   Additional Social History:                         Sleep: Good  Appetite:  Fair  Current Medications: Current Facility-Administered Medications  Medication Dose Route Frequency Provider Last Rate Last Dose  . acetaminophen (TYLENOL) tablet 650 mg  650 mg Oral Q6H PRN Thermon LeylandLaura A Davis, NP      . alum & mag hydroxide-simeth (MAALOX/MYLANTA) 200-200-20 MG/5ML suspension 30 mL  30 mL Oral Q4H PRN Thermon LeylandLaura A Davis, NP      . chlordiazePOXIDE (  LIBRIUM) capsule 25 mg  25 mg Oral Q6H PRN Sanjuana Kava, NP      . chlordiazePOXIDE (LIBRIUM) capsule 25 mg  25 mg Oral TID Sanjuana Kava, NP   25 mg at 12/17/15 1153   Followed by  . [START ON 12/18/2015] chlordiazePOXIDE (LIBRIUM) capsule 25 mg  25 mg Oral BH-qamhs Sanjuana Kava, NP       Followed by  . [START ON 12/19/2015] chlordiazePOXIDE (LIBRIUM) capsule 25 mg  25 mg Oral Daily Sanjuana Kava, NP      . escitalopram (LEXAPRO) tablet 10 mg  10 mg Oral Daily Thermon Leyland, NP   10 mg at 12/17/15 0757  . hydrOXYzine (ATARAX/VISTARIL) tablet 25 mg  25 mg Oral Q6H PRN Sanjuana Kava, NP      . ibuprofen  (ADVIL,MOTRIN) tablet 600 mg  600 mg Oral Q8H PRN Thermon Leyland, NP      . insulin aspart (novoLOG) injection 0-15 Units  0-15 Units Subcutaneous TID WC Rachael Fee, MD   3 Units at 12/17/15 1210  . insulin aspart (novoLOG) injection 4 Units  4 Units Subcutaneous TID WC Rachael Fee, MD   4 Units at 12/17/15 1210  . insulin glargine (LANTUS) injection 15 Units  15 Units Subcutaneous QHS Thermon Leyland, NP   15 Units at 12/16/15 2040  . loperamide (IMODIUM) capsule 2-4 mg  2-4 mg Oral PRN Sanjuana Kava, NP      . magnesium hydroxide (MILK OF MAGNESIA) suspension 30 mL  30 mL Oral Daily PRN Thermon Leyland, NP      . multivitamin with minerals tablet 1 tablet  1 tablet Oral Daily Sanjuana Kava, NP   1 tablet at 12/17/15 0758  . nicotine (NICODERM CQ - dosed in mg/24 hours) patch 14 mg  14 mg Transdermal Daily Thermon Leyland, NP   14 mg at 12/16/15 0817  . ondansetron (ZOFRAN-ODT) disintegrating tablet 4 mg  4 mg Oral Q6H PRN Sanjuana Kava, NP      . risperiDONE (RISPERDAL) tablet 0.5 mg  0.5 mg Oral BID Thermon Leyland, NP   0.5 mg at 12/17/15 0800  . thiamine (VITAMIN B-1) tablet 100 mg  100 mg Oral Daily Sanjuana Kava, NP   100 mg at 12/17/15 0758  . traZODone (DESYREL) tablet 50 mg  50 mg Oral QHS Sanjuana Kava, NP   50 mg at 12/16/15 2109    Lab Results:  Results for orders placed or performed during the hospital encounter of 12/15/15 (from the past 48 hour(s))  Glucose, capillary     Status: Abnormal   Collection Time: 12/15/15  4:56 PM  Result Value Ref Range   Glucose-Capillary 249 (H) 65 - 99 mg/dL  Glucose, capillary     Status: None   Collection Time: 12/15/15  9:02 PM  Result Value Ref Range   Glucose-Capillary 80 65 - 99 mg/dL  Glucose, capillary     Status: Abnormal   Collection Time: 12/16/15  5:46 AM  Result Value Ref Range   Glucose-Capillary 190 (H) 65 - 99 mg/dL   Comment 1 Notify RN   Glucose, capillary     Status: Abnormal   Collection Time: 12/16/15 11:51 AM  Result  Value Ref Range   Glucose-Capillary 249 (H) 65 - 99 mg/dL  Glucose, capillary     Status: Abnormal   Collection Time: 12/16/15  4:44 PM  Result Value Ref Range  Glucose-Capillary 243 (H) 65 - 99 mg/dL  Glucose, capillary     Status: Abnormal   Collection Time: 12/16/15  8:36 PM  Result Value Ref Range   Glucose-Capillary 272 (H) 65 - 99 mg/dL  Glucose, capillary     Status: Abnormal   Collection Time: 12/17/15  5:46 AM  Result Value Ref Range   Glucose-Capillary 333 (H) 65 - 99 mg/dL  Glucose, capillary     Status: Abnormal   Collection Time: 12/17/15 11:59 AM  Result Value Ref Range   Glucose-Capillary 200 (H) 65 - 99 mg/dL    Blood Alcohol level:  Lab Results  Component Value Date   ETH 55* 12/15/2015   ETH 87* 12/12/2015    Physical Findings: AIMS: Facial and Oral Movements Muscles of Facial Expression: None, normal Lips and Perioral Area: None, normal Jaw: None, normal Tongue: None, normal,Extremity Movements Upper (arms, wrists, hands, fingers): None, normal Lower (legs, knees, ankles, toes): None, normal, Trunk Movements Neck, shoulders, hips: None, normal, Overall Severity Severity of abnormal movements (highest score from questions above): None, normal Incapacitation due to abnormal movements: None, normal Patient's awareness of abnormal movements (rate only patient's report): No Awareness, Dental Status Current problems with teeth and/or dentures?: No Does patient usually wear dentures?: No  CIWA:  CIWA-Ar Total: 0 COWS:  COWS Total Score: 3  Musculoskeletal: Strength & Muscle Tone: within normal limits Gait & Station: normal Patient leans: N/A  Psychiatric Specialty Exam: Review of Systems  Constitutional: Positive for malaise/fatigue.  HENT: Negative.   Eyes: Negative.   Respiratory: Negative.   Cardiovascular: Negative.   Gastrointestinal: Negative.   Genitourinary: Negative.   Musculoskeletal: Negative.   Skin: Negative.   Neurological:  Positive for weakness.  Endo/Heme/Allergies: Negative.   Psychiatric/Behavioral: Positive for depression and substance abuse (Alcoholism, chronic). Negative for suicidal ideas, hallucinations and memory loss. The patient is nervous/anxious and has insomnia.     Blood pressure 94/66, pulse 97, temperature 98.2 F (36.8 C), temperature source Oral, resp. rate 14, height  (1.702 m), weight 68.493 kg (151 lb), SpO2 97 %.Body mass index is 23.64 kg/(m^2).  General Appearance: Casual  Eye Contact:: Good  Speech: Normal Rate  Volume: Normal  Mood: Depressed and Irritable  Affect: Congruent and Flat  Thought Process: Coherent  Orientation: Full (Time, Place, and Person)  Thought Content: Hallucinations: Auditory  Suicidal Thoughts: Yes. without intent/plan  Homicidal Thoughts: No  Memory: Immediate; Good Recent; Good Remote; Good  Judgement: Fair  Insight: Fair  Psychomotor Activity: Normal  Concentration: Good  Recall: Good  Fund of Knowledge:Good  Language: Good  Akathisia: No  Handed: Right  AIMS (if indicated):    Assets: Communication Skills Desire for Improvement  ADL's: Intact  Cognition: WNL  Sleep: Number of Hours: 6.75         Treatment Plan Summary: Daily contact with patient to assess and evaluate symptoms and progress in treatment and Medication management: Daily contact with patient to assess and evaluate symptoms and progress in treatment Medication management: Continue the [facility] detox protocols for alcohol detox.  Plan: Supportive approach/coping skills/relapse prevention           Depression: Continue Lexapro 10 mg. Insomnia: continue Trazodone 50 mg,  Continue to work with mindfulness, CBT help  identify the cognitive distortions that keep the depression going/alcoholism.          Discuss other life style changes that can help with both his depression and his alcohol use such like exercise,  meditation  Mood control: will continue the Risperdal 0.5 mg bid.  Will use motivational interviewing and encourage medication management & counseling services while inpatient & after discharge.           Continue home medication regimen for the other medical issues.           Continue to educate on the effects of substance abuse and other recovery strategies.  Sanjuana Kava, NP, PMHNP-BC 12/17/2015, 12:38 PM I agreed with findings and treatment plan of this patient

## 2015-12-17 NOTE — Progress Notes (Signed)
Patient's CBG at 1700 was 432.  T. Lewis, NP was called and stated to give patient 15 units novolog sliding scale and 4 units novolog for meal coverage.  CBG to be repeated in one hour and call NP if CBG is over 400.

## 2015-12-17 NOTE — Progress Notes (Signed)
Patient's CBG after dinner was 263.  Charge nurse informed.

## 2015-12-18 DIAGNOSIS — F321 Major depressive disorder, single episode, moderate: Secondary | ICD-10-CM

## 2015-12-18 LAB — GLUCOSE, CAPILLARY
GLUCOSE-CAPILLARY: 320 mg/dL — AB (ref 65–99)
Glucose-Capillary: 279 mg/dL — ABNORMAL HIGH (ref 65–99)
Glucose-Capillary: 294 mg/dL — ABNORMAL HIGH (ref 65–99)
Glucose-Capillary: 231 mg/dL — ABNORMAL HIGH (ref 65–99)

## 2015-12-18 NOTE — Progress Notes (Signed)
[name] Kennedy has been asleep most of the night. He only awoke in order to take his medications. Denies SI/HI/AVH. Encouragement and support given. Medications administered as prescribed. Continue Q 15 minute checks for patient safety and medication effectiveness.

## 2015-12-18 NOTE — Progress Notes (Signed)
Patient ID: [name] Kennedy, male   DOB: 11/27/1965, 50 y.o.   MRN: [id number] [facility]  12/18/2015 10:34 AM [name] Kennedy  MRN:  [id number]  Subjective: "I am depressed".  Objective: Patient was seen, chart reviewed. He is lying down in his bed during this follow-up visit. Patient reports feeling depressed, denies suicidal thoughts. He denies any withdrawal symptoms. Continues to stay isolated to his room and not attending groups.. He appears to be in no apparent distress. He is instructed & encouraged to participate in the group milieu.  Principal Problem: Alcohol use disorder, dependence, Cocaine dependence with cocaine-induced mood disorder (HCC)  Diagnosis:   Patient Active Problem List   Diagnosis Date Noted  . Alcohol use disorder (HCC) [F10.99]   . Cocaine dependence with cocaine-induced mood disorder (HCC) [F14.24]   . MDD (major depressive disorder) (HCC) [F32.9] 12/15/2015  . MDD (major depressive disorder), recurrent severe, without psychosis (HCC) [F33.2] 12/13/2015  . Cocaine-induced mood disorder (HCC) [F14.94] 12/12/2015  . Alcohol dependence with uncomplicated withdrawal (HCC) [F10.230]   . Insomnia [G47.00]   . 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 Cocaine abuse [F14.10] 10/24/2015  . Alcohol-induced mood disorder (HCC) [F10.94] 06/26/2015  . Substance induced mood disorder (HCC) [F19.94] 06/26/2015  . Uncomplicated alcohol dependence (HCC) [F10.20]   . Alcohol dependence (HCC) [F10.20] 01/24/2013  . Cocaine dependence (HCC) [F14.20] 01/24/2013  . Suicidal ideation [R45.851] 01/23/2013   Total Time spent with patient: 25 minutes  Past Psychiatric History: Hx: Alcogholism, chronic  Past Medical History:  Past Medical History  Diagnosis Date  . Suicidal ideations   . Diabetes mellitus without complication (HCC)   . Alcohol abuse   . Major depressive disorder (HCC)   . Bipolar disorder (HCC)    History  reviewed. No pertinent past surgical history. Family History: History reviewed. No pertinent family history. Family Psychiatric  History: See H&P  Social History:  History  Alcohol Use  . Yes    Comment: "as muck as I can consume"     History  Drug Use  . Yes  . Special: Cocaine    Comment: last used 12/14/15    Social History   Social History  . Marital Status: Single    Spouse Name: N/A  . Number of Children: N/A  . Years of Education: N/A   Social History Main Topics  . Smoking status: Current Every Day Smoker -- 0.00 packs/day    Types: Cigarettes  . Smokeless tobacco: None  . Alcohol Use: Yes     Comment: "as muck as I can consume"  . Drug Use: Yes    Special: Cocaine     Comment: last used 12/14/15  . Sexual Activity: Not Asked   Other Topics Concern  . None   Social History Narrative   Additional Social History:                         Sleep: Good  Appetite:  Fair  Current Medications: Current Facility-Administered Medications  Medication Dose Route Frequency Provider Last Rate Last Dose  . acetaminophen (TYLENOL) tablet 650 mg  650 mg Oral Q6H PRN Thermon LeylandLaura A Davis, NP      . alum & mag hydroxide-simeth (MAALOX/MYLANTA) 200-200-20 MG/5ML suspension 30 mL  30 mL Oral Q4H PRN Thermon LeylandLaura A Davis, NP      . chlordiazePOXIDE (LIBRIUM) capsule 25 mg  25 mg Oral Q6H PRN Sanjuana KavaAgnes I Nwoko, NP      .  chlordiazePOXIDE (LIBRIUM) capsule 25 mg  25 mg Oral BH-qamhs Sanjuana Kava, NP   25 mg at 12/18/15 4540   Followed by  . [START ON 12/19/2015] chlordiazePOXIDE (LIBRIUM) capsule 25 mg  25 mg Oral Daily Sanjuana Kava, NP      . escitalopram (LEXAPRO) tablet 10 mg  10 mg Oral Daily Thermon Leyland, NP   10 mg at 12/18/15 9811  . hydrOXYzine (ATARAX/VISTARIL) tablet 25 mg  25 mg Oral Q6H PRN Sanjuana Kava, NP      . ibuprofen (ADVIL,MOTRIN) tablet 600 mg  600 mg Oral Q8H PRN Thermon Leyland, NP      . insulin aspart (novoLOG) injection 0-15 Units  0-15 Units Subcutaneous TID  WC Rachael Fee, MD   8 Units at 12/18/15 [phone]  . insulin aspart (novoLOG) injection 4 Units  4 Units Subcutaneous TID WC Rachael Fee, MD   4 Units at 12/18/15 8295  . insulin glargine (LANTUS) injection 15 Units  15 Units Subcutaneous QHS Thermon Leyland, NP   15 Units at 12/17/15 2200  . loperamide (IMODIUM) capsule 2-4 mg  2-4 mg Oral PRN Sanjuana Kava, NP      . magnesium hydroxide (MILK OF MAGNESIA) suspension 30 mL  30 mL Oral Daily PRN Thermon Leyland, NP      . multivitamin with minerals tablet 1 tablet  1 tablet Oral Daily Sanjuana Kava, NP   1 tablet at 12/18/15 6213  . nicotine (NICODERM CQ - dosed in mg/24 hours) patch 14 mg  14 mg Transdermal Daily Thermon Leyland, NP   14 mg at 12/18/15 0865  . ondansetron (ZOFRAN-ODT) disintegrating tablet 4 mg  4 mg Oral Q6H PRN Sanjuana Kava, NP      . risperiDONE (RISPERDAL) tablet 0.5 mg  0.5 mg Oral BID Thermon Leyland, NP   0.5 mg at 12/18/15 7846  . thiamine (VITAMIN B-1) tablet 100 mg  100 mg Oral Daily Sanjuana Kava, NP   100 mg at 12/18/15 9629  . traZODone (DESYREL) tablet 50 mg  50 mg Oral QHS Sanjuana Kava, NP   50 mg at 12/16/15 2109    Lab Results:  Results for orders placed or performed during the hospital encounter of 12/15/15 (from the past 48 hour(s))  Glucose, capillary     Status: Abnormal   Collection Time: 12/16/15 11:51 AM  Result Value Ref Range   Glucose-Capillary 249 (H) 65 - 99 mg/dL  Glucose, capillary     Status: Abnormal   Collection Time: 12/16/15  4:44 PM  Result Value Ref Range   Glucose-Capillary 243 (H) 65 - 99 mg/dL  Glucose, capillary     Status: Abnormal   Collection Time: 12/16/15  8:36 PM  Result Value Ref Range   Glucose-Capillary 272 (H) 65 - 99 mg/dL  Glucose, capillary     Status: Abnormal   Collection Time: 12/17/15  5:46 AM  Result Value Ref Range   Glucose-Capillary 333 (H) 65 - 99 mg/dL  Glucose, capillary     Status: Abnormal   Collection Time: 12/17/15 11:59 AM  Result Value Ref Range    Glucose-Capillary 200 (H) 65 - 99 mg/dL  Glucose, capillary     Status: Abnormal   Collection Time: 12/17/15  4:39 PM  Result Value Ref Range   Glucose-Capillary 432 (H) 65 - 99 mg/dL   Comment 1 Notify RN    Comment 2 Document in Chart  Glucose, capillary     Status: Abnormal   Collection Time: 12/17/15  6:23 PM  Result Value Ref Range   Glucose-Capillary 263 (H) 65 - 99 mg/dL   Comment 1 Notify RN    Comment 2 Document in Chart   Glucose, capillary     Status: Abnormal   Collection Time: 12/17/15  9:25 PM  Result Value Ref Range   Glucose-Capillary 177 (H) 65 - 99 mg/dL  Glucose, capillary     Status: Abnormal   Collection Time: 12/18/15  5:58 AM  Result Value Ref Range   Glucose-Capillary 279 (H) 65 - 99 mg/dL    Blood Alcohol level:  Lab Results  Component Value Date   ETH 55* 12/15/2015   ETH 87* 12/12/2015    Physical Findings: AIMS: Facial and Oral Movements Muscles of Facial Expression: None, normal Lips and Perioral Area: None, normal Jaw: None, normal Tongue: None, normal,Extremity Movements Upper (arms, wrists, hands, fingers): None, normal Lower (legs, knees, ankles, toes): None, normal, Trunk Movements Neck, shoulders, hips: None, normal, Overall Severity Severity of abnormal movements (highest score from questions above): None, normal Incapacitation due to abnormal movements: None, normal Patient's awareness of abnormal movements (rate only patient's report): No Awareness, Dental Status Current problems with teeth and/or dentures?: No Does patient usually wear dentures?: No  CIWA:  CIWA-Ar Total: 0 COWS:  COWS Total Score: 1  Musculoskeletal: Strength & Muscle Tone: within normal limits Gait & Station: normal Patient leans: N/A  Psychiatric Specialty Exam: Review of Systems  Constitutional: Positive for malaise/fatigue.  HENT: Negative.   Eyes: Negative.   Respiratory: Negative.   Cardiovascular: Negative.   Gastrointestinal: Negative.    Genitourinary: Negative.   Musculoskeletal: Negative.   Skin: Negative.   Neurological: Positive for weakness.  Endo/Heme/Allergies: Negative.   Psychiatric/Behavioral: Positive for depression and substance abuse (Alcoholism, chronic). Negative for suicidal ideas, hallucinations and memory loss. The patient is nervous/anxious and has insomnia.     Blood pressure 111/73, pulse 91, temperature 98.5 F (36.9 C), temperature source Oral, resp. rate 16, height 5\' 7"  (1.702 m), weight 151 lb (68.493 kg), SpO2 97 %.Body mass index is 23.64 kg/(m^2).  General Appearance: Casual  Eye Contact:: Good  Speech: Normal Rate  Volume: Normal  Mood: Depressed  Affect: Congruent and Flat  Thought Process: Coherent  Orientation: Full (Time, Place, and Person)  Thought Content:normal  Suicidal Thoughts: denies  Homicidal Thoughts: No  Memory: Immediate; Good Recent; Good Remote; Good  Judgement: Fair  Insight: Fair  Psychomotor Activity: Normal  Concentration: Good  Recall: Good  Fund of Knowledge:Good  Language: Good  Akathisia: No  Handed: Right  AIMS (if indicated):    Assets: Communication Skills Desire for Improvement  ADL's: Intact  Cognition: WNL  Sleep: Number of Hours: 6.75         Treatment Plan Summary: Daily contact with patient to assess and evaluate symptoms and progress in treatment and Medication management: Daily contact with patient to assess and evaluate symptoms and progress in treatment Medication management: Continue the Librium detox protocols for alcohol detox.  Plan: Supportive approach/coping skills/relapse prevention           Depression: Continue Lexapro 10 mg, consider increasing the dose. Insomnia: continue Trazodone 50 mg,  Continue to work with mindfulness, CBT help  identify the cognitive distortions that keep the depression going/alcoholism.          Discuss other life style changes that can help with  both his depression and his  alcohol use such like exercise, meditation           Mood control: will continue the Risperdal 0.5 mg bid.  Will use motivational interviewing and encourage medication management & counseling services while inpatient & after discharge.           Continue home medication regimen for the other medical issues.           Continue to educate on the effects of substance abuse and other recovery strategies.  Patrick North, MD 12/18/2015, 10:34 AM

## 2015-12-18 NOTE — Progress Notes (Signed)
Psychoeducational Group Note  Date:  12/18/2015 Time:  2354  Group Topic/Focus:  Relapse Prevention Planning:   The focus of this group is to define relapse and discuss the need for planning to combat relapse. Wrap-Up Group:   The focus of this group is to help patients review their daily goal of treatment and discuss progress on daily workbooks.  Participation Level: Did Not Attend  Participation Quality:  Not Applicable  Affect:  Not Applicable  Cognitive:  Not Applicable  Insight:  Not Applicable  Engagement in Group: Not Applicable  Additional Comments:  The patient did not attend this evening's A.A. Meeting.   Hazle CocaGOODMAN, Eamon Tantillo S 12/18/2015, 11:54 PM

## 2015-12-18 NOTE — [facility] Group Notes (Signed)
[facility] LCSW Aftercare Discharge Planning Group Note  12/18/2015  8:45 AM  Participation Quality: Did Not Attend. Patient invited to participate but declined.  Mayu Ronk, MSW, LCSW Clinical Social Worker [facility] [phone]   

## 2015-12-18 NOTE — [facility] Group Notes (Signed)
[facility] LCSW Group Therapy 12/18/2015  1:15 PM   Type of Therapy: Group Therapy  Participation Level: Did Not Attend. Patient invited to participate but declined.   Merve Hotard, MSW, LCSW Clinical Social Worker  [facility] [phone]   

## 2015-12-18 NOTE — Progress Notes (Signed)
D-  Patient has been isolative to room this shift.  Patient denies SI, HI and AVH this shift.  Patient reported needing help with placement upon discharge.  Patient  A- Assess patient for safety, offer medications as prescribed, engage patient in 1:1 staff talks  R- Patient able to contract for safety.

## 2015-12-18 NOTE — Progress Notes (Signed)
Recreation Therapy Notes  Date: 05.15.2017 Time: 9:30am Location: [address] Group Room   Group Topic: Stress Management  Goal Area(s) Addresses:  Patient will actively participate in stress management techniques presented during session.   Behavioral Response: Did not attend.   Marykay Lexenise L Elasia Furnish, LRT/CTRS        Daje Stark L 12/18/2015 1:50 PM

## 2015-12-19 DIAGNOSIS — F321 Major depressive disorder, single episode, moderate: Secondary | ICD-10-CM | POA: Diagnosis present

## 2015-12-19 DIAGNOSIS — E088 Diabetes mellitus due to underlying condition with unspecified complications: Secondary | ICD-10-CM

## 2015-12-19 LAB — GLUCOSE, CAPILLARY
GLUCOSE-CAPILLARY: 283 mg/dL — AB (ref 65–99)
GLUCOSE-CAPILLARY: 340 mg/dL — AB (ref 65–99)
GLUCOSE-CAPILLARY: 429 mg/dL — AB (ref 65–99)
Glucose-Capillary: 261 mg/dL — ABNORMAL HIGH (ref 65–99)

## 2015-12-19 MED ORDER — ESCITALOPRAM OXALATE 10 MG PO TABS
15.0000 mg | ORAL_TABLET | Freq: Every day | ORAL | Status: DC
  Administered 2015-12-20 – 2015-12-21 (×2): 15 mg via ORAL
  Filled 2015-12-19 (×4): qty 1.5
  Filled 2015-12-19: qty 11

## 2015-12-19 MED ORDER — INSULIN GLARGINE 100 UNIT/ML ~~LOC~~ SOLN
18.0000 [IU] | Freq: Every day | SUBCUTANEOUS | Status: DC
  Administered 2015-12-19: 18 [IU] via SUBCUTANEOUS

## 2015-12-19 MED ORDER — INSULIN ASPART 100 UNIT/ML ~~LOC~~ SOLN
6.0000 [IU] | Freq: Three times a day (TID) | SUBCUTANEOUS | Status: DC
  Administered 2015-12-19 – 2015-12-22 (×9): 6 [IU] via SUBCUTANEOUS
  Filled 2015-12-19 (×2): qty 0.06

## 2015-12-19 MED ORDER — INSULIN ASPART 100 UNIT/ML ~~LOC~~ SOLN
10.0000 [IU] | Freq: Once | SUBCUTANEOUS | Status: AC
  Administered 2015-12-19: 10 [IU] via SUBCUTANEOUS

## 2015-12-19 NOTE — Progress Notes (Signed)
Recreation Therapy Notes  Animal-Assisted Activity (AAA) Program Checklist/Progress Notes Patient Eligibility Criteria Checklist & Daily Group note for Rec Tx Intervention  Date: 05.15.2017 Time: 2:45pm [facility] [address] [facility] [facility]    AAA/T Program Assumption of Risk Form signed by Patient/ or Parent Legal Guardian Yes  Patient is free of allergies or sever asthma Yes  Patient reports no fear of animals Yes  Patient reports no history of cruelty to animals Yes  Patient understands his/her participation is voluntary Yes  Behavioral Response: Did not attend.    Marykay Lexenise L Kamarii Buren, LRT/CTRS        Jearl KlinefelterBlanchfield, Brittanee Ghazarian L 12/19/2015 2:59 PM

## 2015-12-19 NOTE — Progress Notes (Signed)
D-  Patient has been isolative to his room for the majority of the shift.  Patient did not engage with Clinical research associatewriter nor did he engage in his treatment.     A- assess patient for safety, offer medications as prescribed engage patient in 1:1 staff talks.   R- Patient able to contract for safety.

## 2015-12-19 NOTE — Consult Note (Signed)
Medical Consultation   [name] Kennedy  WNU:[id number]  DOB: 06/10/1966  DOA: 12/15/2015  PCP: No primary care provider on file.     Requesting physician: Dr Daleen Boavi  Reason for consultation: DM management  History of Present Illness: [name] Kennedy is an 50 y.o. male with a past medical history of diabetes mellitus that was diagnosed 5 years ago, homelessness, admitted to health for cocaine induced mood disorder. States that he has been unable to afford his medications and has not been on his diabetic regimen. When asked what has he taken in the past for his diabetes he reports that he has never been able to afford any of his medications. Initially presented with blood sugars in the 400 range, was started on Lantus 15 units subcutaneous daily. With the past 24 hours blood sugars have been in the 200 range. Patient reporting stress and concern about his homeless situation. He denies chest pain, shortness of breath, fevers, chills, nausea, vomiting. He states tolerating by mouth intake.     Review of Systems:  ROS As per HPI otherwise 10 point review of systems negative.    Past Medical History: Past Medical History  Diagnosis Date  . Suicidal ideations   . Diabetes mellitus without complication (HCC)   . Alcohol abuse   . Major depressive disorder (HCC)   . Bipolar disorder [facility](HCC)     Past Surgical History: History reviewed. No pertinent past surgical history.   Allergies:  No Known Allergies   Social History:  reports that he has been smoking Cigarettes.  He has been smoking about 0.00 packs per day. He does not have any smokeless tobacco history on file. He reports that he drinks alcohol. He reports that he uses illicit drugs (Cocaine).   Family History: Noncontributory  Physical Exam: Filed Vitals:   12/18/15 1157 12/18/15 1706 12/19/15 0700 12/19/15 0701  BP: 112/71 119/67 102/63 103/68  Pulse: 91 93 83 92  Temp:   98.5 F (36.9 C)   TempSrc:    Oral   Resp: 16 16 16    Height:      Weight:      SpO2:        Constitutional: Nontoxic-appearing Eyes: PERLA, EOMI, irises appear normal, anicteric sclera,  ENMT: external ears and nose appear normal,            Lips appears normal, oropharynx mucosa, tongue, posterior pharynx appear normal  Neck: neck appears normal, no masses, normal ROM, no thyromegaly, no JVD  CVS: S1-S2 clear, no murmur rubs or gallops, no LE edema, normal pedal pulses  Respiratory:  clear to auscultation bilaterally, no wheezing, rales or rhonchi. Respiratory effort normal. No accessory muscle use.  Abdomen: soft nontender, nondistended, normal bowel sounds, no hepatosplenomegaly, no hernias  Musculoskeletal: : no cyanosis, clubbing or edema noted bilaterally Neuro: Cranial nerves II-XII intact, strength, sensation, reflexes Psych: judgement and insight appear normal, stable mood and affect, mental status Skin: no rashes or lesions or ulcers, no induration or nodules   Data reviewed:  I have personally reviewed following labs and imaging studies Labs:  CBC:  Recent Labs Lab 12/15/15 0422  WBC 9.4  HGB 13.5  HCT 40.1  MCV 83.4  PLT 372    Basic Metabolic Panel:  Recent Labs Lab 12/15/15 0422  NA 142  K 3.8  CL 102  CO2 27  GLUCOSE 235*  BUN 6  CREATININE 0.74  CALCIUM  9.2   GFR Estimated Creatinine Clearance: 104.4 mL/min (by C-G formula based on Cr of 0.74). Liver Function Tests:  Recent Labs Lab 12/15/15 0422  AST 16  ALT 13*  ALKPHOS 73  BILITOT 0.3  PROT 7.3  ALBUMIN 3.9   No results for input(s): LIPASE, AMYLASE in the last 168 hours. No results for input(s): AMMONIA in the last 168 hours. Coagulation profile No results for input(s): INR, PROTIME in the last 168 hours.  Cardiac Enzymes: No results for input(s): CKTOTAL, CKMB, CKMBINDEX, TROPONINI in the last 168 hours. BNP: Invalid input(s): POCBNP CBG:  Recent Labs Lab 12/18/15 0558 12/18/15 1205 12/18/15 1713  12/18/15 2112 12/19/15 0556  GLUCAP 279* 320* 294* 231* 283*   D-Dimer No results for input(s): DDIMER in the last 72 hours. Hgb A1c No results for input(s): HGBA1C in the last 72 hours. Lipid Profile No results for input(s): CHOL, HDL, LDLCALC, TRIG, CHOLHDL, LDLDIRECT in the last 72 hours. Thyroid function studies No results for input(s): TSH, T4TOTAL, T3FREE, THYROIDAB in the last 72 hours.  Invalid input(s): FREET3 Anemia work up No results for input(s): VITAMINB12, FOLATE, FERRITIN, TIBC, IRON, RETICCTPCT in the last 72 hours. Urinalysis    Component Value Date/Time   COLORURINE YELLOW 06/25/2015 1926   APPEARANCEUR CLEAR 06/25/2015 1926   LABSPEC 1.029 06/25/2015 1926   PHURINE 5.5 06/25/2015 1926   GLUCOSEU >1000* 06/25/2015 1926   HGBUR TRACE* 06/25/2015 1926   BILIRUBINUR NEGATIVE 06/25/2015 1926   KETONESUR NEGATIVE 06/25/2015 1926   PROTEINUR NEGATIVE 06/25/2015 1926   UROBILINOGEN 0.2 05/03/2013 2324   NITRITE NEGATIVE 06/25/2015 1926   LEUKOCYTESUR NEGATIVE 06/25/2015 1926     Microbiology No results found for this or any previous visit (from the past 240 hour(s)).     Inpatient Medications:   Scheduled Meds: . [START ON 12/20/2015] escitalopram  15 mg Oral Daily  . insulin aspart  0-15 Units Subcutaneous TID WC  . insulin aspart  6 Units Subcutaneous TID WC  . insulin glargine  18 Units Subcutaneous QHS  . multivitamin with minerals  1 tablet Oral Daily  . nicotine  14 mg Transdermal Daily  . risperiDONE  0.5 mg Oral BID  . thiamine  100 mg Oral Daily  . traZODone  50 mg Oral QHS   Continuous Infusions:    Radiological Exams on Admission: No results found.  Impression/Recommendations Active Problems:   MDD (major depressive disorder) (HCC)   Alcohol use disorder (HCC)   Cocaine dependence with cocaine-induced mood disorder (HCC)   Moderate single current episode of major depressive disorder (HCC)  1.  Diabetes mellitus. [name] Kennedy is a  50 year old with history of diabetes mellitus diagnosed 5 years ago presenting with hyperglycemia. He reports unable to afford medications in the outpatient setting. Blood sugars elevated in the 200 range, will recommend increasing left to 18 units subcutaneous daily and mealtime coverage to 6 units. Will likely need further titration. Would recommend social work consult on discharge to assist with prescriptions. Recommend continuing slight scale coverage and can't modify diet.  Please call with any questions. Will sign off think you    Time Spent: 35 min  Jeralyn Bennett M.D. [facility] 12/19/2015, 11:41 AM

## 2015-12-19 NOTE — Progress Notes (Signed)
D: Pt denies SI/HI/AVH. Pt is pleasant and cooperative. Pt sleep majority of the evening.   A: Pt was offered support and encouragement. Pt was given scheduled medications. Pt was encourage to attend groups. Q 15 minute checks were done for safety.   R:Pt attends groups and interacts well with peers and staff. Pt is taking medication. Pt has no complaints at this time.Pt receptive to treatment and safety maintained on unit.
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
## 2015-12-19 NOTE — Progress Notes (Signed)
Patient ID: [name] Kennedy, male   DOB: 20-Sep-1965, 50 y.o.   MRN: [id number]  [facility] MD Progress Note  12/19/2015 11:08 AM [name] Kennedy  MRN:  [id number]  Subjective: "I am still depressed".  Objective: Patient was seen, chart reviewed. Patient was discussed in treatment team this morning. Per staff patient has been admitted multiple times to this unit. He has been homeless for 5 years. Patient continues to stay isolated to his room and has not been attending any groups. He continues to report feeling depressed and states that his medications need to be changed. We discussed increasing his Lexapro to 15 mg. Patient urged to attend groups and and leave his room.  Patient's blood sugars running high in the 200s.  Principal Problem: Alcohol use disorder, dependence, Cocaine dependence with cocaine-induced mood disorder (HCC)  Diagnosis:   Patient Active Problem List   Diagnosis Date Noted  . Alcohol use disorder (HCC) [F10.99]   . Cocaine dependence with cocaine-induced mood disorder (HCC) [F14.24]   . MDD (major depressive disorder) (HCC) [F32.9] 12/15/2015  . MDD (major depressive disorder), recurrent severe, without psychosis (HCC) [F33.2] 12/13/2015  . Cocaine-induced mood disorder (HCC) [F14.94] 12/12/2015  . Alcohol dependence with uncomplicated withdrawal (HCC) [F10.230]   . Insomnia [G47.00]   . 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 Cocaine abuse [F14.10] 10/24/2015  . Alcohol-induced mood disorder (HCC) [F10.94] 06/26/2015  . Substance induced mood disorder (HCC) [F19.94] 06/26/2015  . Uncomplicated alcohol dependence (HCC) [F10.20]   . Alcohol dependence (HCC) [F10.20] 01/24/2013  . Cocaine dependence (HCC) [F14.20] 01/24/2013  . Suicidal ideation [R45.851] 01/23/2013   Total Time spent with patient: 25 minutes  Past Psychiatric History: Hx: Alcogholism, chronic  Past Medical History:  Past Medical History  Diagnosis Date  . Suicidal  ideations   . Diabetes mellitus without complication (HCC)   . Alcohol abuse   . Major depressive disorder (HCC)   . Bipolar disorder (HCC)    History reviewed. No pertinent past surgical history. Family History: History reviewed. No pertinent family history. Family Psychiatric  History: See H&P  Social History:  History  Alcohol Use  . Yes    Comment: "as muck as I can consume"     History  Drug Use  . Yes  . Special: Cocaine    Comment: last used 12/14/15    Social History   Social History  . Marital Status: Single    Spouse Name: N/A  . Number of Children: N/A  . Years of Education: N/A   Social History Main Topics  . Smoking status: Current Every Day Smoker -- 0.00 packs/day    Types: Cigarettes  . Smokeless tobacco: None  . Alcohol Use: Yes     Comment: "as muck as I can consume"  . Drug Use: Yes    Special: Cocaine     Comment: last used 12/14/15  . Sexual Activity: Not Asked   Other Topics Concern  . None   Social History Narrative   Additional Social History:                         Sleep: Good  Appetite:  Fair  Current Medications: Current Facility-Administered Medications  Medication Dose Route Frequency Provider Last Rate Last Dose  . acetaminophen (TYLENOL) tablet 650 mg  650 mg Oral Q6H PRN Thermon Leyland, NP      . alum & mag hydroxide-simeth (MAALOX/MYLANTA) 200-200-20 MG/5ML suspension 30 mL  30 mL Oral Q4H PRN Thermon Leyland, NP      . Melene Muller ON 12/20/2015] escitalopram (LEXAPRO) tablet 15 mg  15 mg Oral Daily Karenann Mcgrory, MD      . ibuprofen (ADVIL,MOTRIN) tablet 600 mg  600 mg Oral Q8H PRN Thermon Leyland, NP      . insulin aspart (novoLOG) injection 0-15 Units  0-15 Units Subcutaneous TID WC Rachael Fee, MD   8 Units at 12/19/15 [phone]  . insulin aspart (novoLOG) injection 4 Units  4 Units Subcutaneous TID WC Rachael Fee, MD   4 Units at 12/19/15 [phone]  . insulin glargine (LANTUS) injection 15 Units  15 Units Subcutaneous QHS  Thermon Leyland, NP   15 Units at 12/18/15 2255  . magnesium hydroxide (MILK OF MAGNESIA) suspension 30 mL  30 mL Oral Daily PRN Thermon Leyland, NP      . multivitamin with minerals tablet 1 tablet  1 tablet Oral Daily Sanjuana Kava, NP   1 tablet at 12/19/15 0856  . nicotine (NICODERM CQ - dosed in mg/24 hours) patch 14 mg  14 mg Transdermal Daily Thermon Leyland, NP   14 mg at 12/19/15 0854  . risperiDONE (RISPERDAL) tablet 0.5 mg  0.5 mg Oral BID Thermon Leyland, NP   0.5 mg at 12/19/15 0855  . thiamine (VITAMIN B-1) tablet 100 mg  100 mg Oral Daily Sanjuana Kava, NP   100 mg at 12/19/15 0855  . traZODone (DESYREL) tablet 50 mg  50 mg Oral QHS Sanjuana Kava, NP   50 mg at 12/18/15 2253    Lab Results:  Results for orders placed or performed during the hospital encounter of 12/15/15 (from the past 48 hour(s))  Glucose, capillary     Status: Abnormal   Collection Time: 12/17/15 11:59 AM  Result Value Ref Range   Glucose-Capillary 200 (H) 65 - 99 mg/dL  Glucose, capillary     Status: Abnormal   Collection Time: 12/17/15  4:39 PM  Result Value Ref Range   Glucose-Capillary 432 (H) 65 - 99 mg/dL   Comment 1 Notify RN    Comment 2 Document in Chart   Glucose, capillary     Status: Abnormal   Collection Time: 12/17/15  6:23 PM  Result Value Ref Range   Glucose-Capillary 263 (H) 65 - 99 mg/dL   Comment 1 Notify RN    Comment 2 Document in Chart   Glucose, capillary     Status: Abnormal   Collection Time: 12/17/15  9:25 PM  Result Value Ref Range   Glucose-Capillary 177 (H) 65 - 99 mg/dL  Glucose, capillary     Status: Abnormal   Collection Time: 12/18/15  5:58 AM  Result Value Ref Range   Glucose-Capillary 279 (H) 65 - 99 mg/dL  Glucose, capillary     Status: Abnormal   Collection Time: 12/18/15 12:05 PM  Result Value Ref Range   Glucose-Capillary 320 (H) 65 - 99 mg/dL  Glucose, capillary     Status: Abnormal   Collection Time: 12/18/15  5:13 PM  Result Value Ref Range    Glucose-Capillary 294 (H) 65 - 99 mg/dL  Glucose, capillary     Status: Abnormal   Collection Time: 12/18/15  9:12 PM  Result Value Ref Range   Glucose-Capillary 231 (H) 65 - 99 mg/dL   Comment 1 Notify RN   Glucose, capillary     Status: Abnormal   Collection Time: 12/19/15  5:56 AM  Result Value Ref Range   Glucose-Capillary 283 (H) 65 - 99 mg/dL   Comment 1 Notify RN     Blood Alcohol level:  Lab Results  Component Value Date   ETH 55* 12/15/2015   ETH 87* 12/12/2015    Physical Findings: AIMS: Facial and Oral Movements Muscles of Facial Expression: None, normal Lips and Perioral Area: None, normal Jaw: None, normal Tongue: None, normal,Extremity Movements Upper (arms, wrists, hands, fingers): None, normal Lower (legs, knees, ankles, toes): None, normal, Trunk Movements Neck, shoulders, hips: None, normal, Overall Severity Severity of abnormal movements (highest score from questions above): None, normal Incapacitation due to abnormal movements: None, normal Patient's awareness of abnormal movements (rate only patient's report): No Awareness, Dental Status Current problems with teeth and/or dentures?: No Does patient usually wear dentures?: No  CIWA:  CIWA-Ar Total: 0 COWS:  COWS Total Score: 1  Musculoskeletal: Strength & Muscle Tone: within normal limits Gait & Station: normal Patient leans: N/A  Psychiatric Specialty Exam: Review of Systems  Constitutional: Positive for malaise/fatigue.  HENT: Negative.   Eyes: Negative.   Respiratory: Negative.   Cardiovascular: Negative.   Gastrointestinal: Negative.   Genitourinary: Negative.   Musculoskeletal: Negative.   Skin: Negative.   Neurological: Positive for weakness.  Endo/Heme/Allergies: Negative.   Psychiatric/Behavioral: Positive for depression and substance abuse (Alcoholism, chronic). Negative for suicidal ideas, hallucinations and memory loss. The patient is nervous/anxious and has insomnia.     Blood  pressure 103/68, pulse 92, temperature 98.5 F (36.9 C), temperature source Oral, resp. rate 16, height  (1.702 m), weight 151 lb (68.493 kg), SpO2 97 %.Body mass index is 23.64 kg/(m^2).  General Appearance: Casual  Eye Contact:: Good  Speech: Normal Rate  Volume: Normal  Mood: Depressed  Affect: Congruent and Flat  Thought Process: Coherent  Orientation: Full (Time, Place, and Person)  Thought Content:normal  Suicidal Thoughts: denies  Homicidal Thoughts: No  Memory: Immediate; Good Recent; Good Remote; Good  Judgement: Fair  Insight: Fair  Psychomotor Activity: Normal  Concentration: Good  Recall: Good  Fund of Knowledge:Good  Language: Good  Akathisia: No  Handed: Right  AIMS (if indicated):    Assets: Communication Skills Desire for Improvement  ADL's: Intact  Cognition: WNL  Sleep: Number of Hours: 6.75         Treatment Plan Summary: Daily contact with patient to assess and evaluate symptoms and progress in treatment and Medication management: Daily contact with patient to assess and evaluate symptoms and progress in treatment Medication management: Continue the [facility] detox protocols for alcohol detox.  Plan: Supportive approach/coping skills/relapse prevention           Depression: Increase Lexapro to  po qd. Insomnia: continue Trazodone 50 mg,  Continue to work with mindfulness, CBT help  identify the cognitive distortions that keep the depression going/alcoholism.           Encourage patient to attend groups.          Discuss other life style changes that can help with both his depression and his alcohol use such like exercise, meditation           Mood control: will continue the Risperdal 0.5 mg bid.  Will use motivational interviewing and encourage medication management & counseling services while inpatient & after discharge.           Continue home medication regimen for the other medical  issues.  Continue to educate on the effects of substance abuse and other recovery strategies.  Diabetes Obtain hospitalist, consult to address his elevated blood sugars.  Patrick NorthAVI, Jazyah Butsch, MD 12/19/2015, 11:08 AM

## 2015-12-19 NOTE — Plan of Care (Signed)
Problem: Alteration in mood Goal: LTG-Patient reports reduction in suicidal thoughts (Patient reports reduction in suicidal thoughts and is able to verbalize a safety plan for whenever patient is feeling suicidal)  Outcome: Progressing Pt denies SI at this time     

## 2015-12-19 NOTE — [facility] Group Notes (Signed)
[facility] LCSW Group Therapy 12/19/2015  1:15 PM   Type of Therapy: Group Therapy  Participation Level: Did Not Attend. Patient invited to participate but declined.   Rafaella Kole, MSW, LCSW Clinical Social Worker [facility] [phone]   

## 2015-12-19 NOTE — BHH Group Notes (Signed)
The focus of this group is to educate the patient on the purpose and policies of crisis stabilization and provide a format to answer questions about their admission.  The group details unit policies and expectations of patients while admitted.  Patient did not attend 0900 nurse education orientation group this morning.  Patient stayed in bed.   

## 2015-12-20 LAB — GLUCOSE, CAPILLARY
GLUCOSE-CAPILLARY: 378 mg/dL — AB (ref 65–99)
GLUCOSE-CAPILLARY: 433 mg/dL — AB (ref 65–99)
Glucose-Capillary: 222 mg/dL — ABNORMAL HIGH (ref 65–99)
Glucose-Capillary: 186 mg/dL — ABNORMAL HIGH (ref 65–99)

## 2015-12-20 MED ORDER — INSULIN ASPART 100 UNIT/ML ~~LOC~~ SOLN
0.0000 [IU] | Freq: Three times a day (TID) | SUBCUTANEOUS | Status: DC
  Administered 2015-12-21: 15 [IU] via SUBCUTANEOUS
  Administered 2015-12-21: 4 [IU] via SUBCUTANEOUS
  Administered 2015-12-21: 15 [IU] via SUBCUTANEOUS
  Administered 2015-12-22: 7 [IU] via SUBCUTANEOUS

## 2015-12-20 MED ORDER — INSULIN ASPART 100 UNIT/ML ~~LOC~~ SOLN
0.0000 [IU] | Freq: Every day | SUBCUTANEOUS | Status: DC
  Administered 2015-12-20: 5 [IU] via SUBCUTANEOUS
  Administered 2015-12-21: 2 [IU] via SUBCUTANEOUS

## 2015-12-20 MED ORDER — INSULIN GLARGINE 100 UNIT/ML ~~LOC~~ SOLN
24.0000 [IU] | Freq: Every day | SUBCUTANEOUS | Status: DC
  Administered 2015-12-20 – 2015-12-21 (×2): 24 [IU] via SUBCUTANEOUS
  Filled 2015-12-20 (×2): qty 0.24

## 2015-12-20 NOTE — [facility] Group Notes (Signed)
Patient did not attend group.

## 2015-12-20 NOTE — [facility] Group Notes (Signed)
[facility] LCSW Group Therapy  12/20/2015 3:46 PM  Type of Therapy:  Group Therapy  Participation Level:  None  Participation Quality:  Attentive  Affect:  Depressed and Flat  Cognitive:  Oriented  Insight:  Limited  Engagement in Therapy:  Limited  Modes of Intervention:  Discussion, Education, Exploration, Limit-setting, Problem-solving, Rapport Building, Socialization and Support  Summary of Progress/Problems: Feelings around Diagnosis--Patients were asked to identify how they felt about being diagnosed with a mental illness/substance abuse issue, identify what they have lost due to their illness(es), and identify what they have learned from their experiences. Patient were also asked to process how perceive their mental illness/substance abuse issue personally, relating to family, and relating to society. [name] Kennedy sat quitely for the first 20 minutes of group. He did not contribute to discussion but actively listened to others. After 20 minutes, he left group and did not return. At this time, he does not appear to be making progress in the group setting.   Smart, Agustin Swatek LCSW 12/20/2015, 3:46 PM

## 2015-12-20 NOTE — Progress Notes (Signed)
D: Pt denies SI/HI/AVH. Pt is pleasant and cooperative. Pt stated he was feeling better. Pt observed on unit watching TV interacting with peers.   A: Pt was offered support and encouragement. Pt was given scheduled medications. Pt was encourage to attend groups. Q 15 minute checks were done for safety.   R:Pt attends groups and interacts well with peers and staff. Pt is taking medication. Pt has no complaints at this time.Pt receptive to treatment and safety maintained on unit.

## 2015-12-20 NOTE — Progress Notes (Signed)
Recreation Therapy Notes  Date: 05.17.2017 Time: 9:30am Location: 300 Hall Group Room   Group Topic: Stress Management  Goal Area(s) Addresses:  Patient will actively participate in stress management techniques presented during session.   Behavioral Response: Did not attend.   Marykay Lexenise L Sydney Hasten, LRT/CTRS        Loye Vento L 12/20/2015 2:08 PM

## 2015-12-20 NOTE — [facility] Group Notes (Signed)
[facility]   12/20/2015 9:39 AM  Participation Quality:  Invited. DID NOT ATTEND.   Smart, Radford Pease LCSW

## 2015-12-20 NOTE — Progress Notes (Signed)
[facility] Group Notes:  (Nursing/MHT/Case Management/Adjunct)  Date:  12/20/2015  Time:  2:44 PM  Type of Therapy:  Psychoeducational Skills  Participation Level:  Did Not Attend  Participation Quality:  did not attend  Affect:  did not attend  Cognitive:  did not attend  Insight:  None  Engagement in Group:  did not attend  Modes of Intervention:  did not attend  Summary of Progress/Problems: Pt did not attend group.  Karleen HampshireFox, Tamra Koos Brittini 12/20/2015, 2:44 PM

## 2015-12-20 NOTE — Progress Notes (Signed)
Patient ID: [name] Kennedy, male   DOB: 02-24-1966, 50 y.o.   MRN: [id number]  [facility] MD Progress Note  12/20/2015 3:34 PM [name] Kennedy  MRN:  [id number]  Subjective: "What is my diagnosis.  Am I bipolar?"  Objective: Patient was seen today, chart reviewed. Patient was discussed in treatment team this morning. Per staff patient has been admitted multiple times to this unit. He stated that he had been homeless for 5 years and was raised by his grandma.  He is not isolating in his room has been attending any groups. He continues to report feeling depressed and states that his medications need to be changed. We discussed increasing his Lexapro to 15 mg. Patient urged to attend groups and and leave his room.  Patient's blood sugars running high in the 200s.  Principal Problem: Alcohol use disorder, dependence, Cocaine dependence with cocaine-induced mood disorder (HCC)  Diagnosis:   Patient Active Problem List   Diagnosis Date Noted  . Moderate single current episode of major depressive disorder (HCC) [F32.1]   . Alcohol use disorder (HCC) [F10.99]   . Cocaine dependence with cocaine-induced mood disorder (HCC) [F14.24]   . MDD (major depressive disorder) (HCC) [F32.9] 12/15/2015  . MDD (major depressive disorder), recurrent severe, without psychosis (HCC) [F33.2] 12/13/2015  . Cocaine-induced mood disorder (HCC) [F14.94] 12/12/2015  . Alcohol dependence with uncomplicated withdrawal (HCC) [F10.230]   . Insomnia [G47.00]   . 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 Cocaine abuse [F14.10] 10/24/2015  . Alcohol-induced mood disorder (HCC) [F10.94] 06/26/2015  . Substance induced mood disorder (HCC) [F19.94] 06/26/2015  . Uncomplicated alcohol dependence (HCC) [F10.20]   . Alcohol dependence (HCC) [F10.20] 01/24/2013  . Cocaine dependence (HCC) [F14.20] 01/24/2013  . Suicidal ideation [R45.851] 01/23/2013   Total Time spent with patient: 25 minutes  Past  Psychiatric History: Hx: Alcogholism, chronic  Past Medical History:  Past Medical History  Diagnosis Date  . Suicidal ideations   . Diabetes mellitus without complication (HCC)   . Alcohol abuse   . Major depressive disorder (HCC)   . Bipolar disorder (HCC)    History reviewed. No pertinent past surgical history. Family History: History reviewed. No pertinent family history. Family Psychiatric  History: See H&P  Social History:  History  Alcohol Use  . Yes    Comment: "as muck as I can consume"     History  Drug Use  . Yes  . Special: Cocaine    Comment: last used 12/14/15    Social History   Social History  . Marital Status: Single    Spouse Name: N/A  . Number of Children: N/A  . Years of Education: N/A   Social History Main Topics  . Smoking status: Current Every Day Smoker -- 0.00 packs/day    Types: Cigarettes  . Smokeless tobacco: None  . Alcohol Use: Yes     Comment: "as muck as I can consume"  . Drug Use: Yes    Special: Cocaine     Comment: last used 12/14/15  . Sexual Activity: Not Asked   Other Topics Concern  . None   Social History Narrative   Additional Social History:                         Sleep: Good  Appetite:  Fair  Current Medications: Current Facility-Administered Medications  Medication Dose Route Frequency Provider Last Rate Last Dose  . acetaminophen (TYLENOL) tablet 650 mg  650 mg Oral Q6H PRN Thermon Leyland, NP      . alum & mag hydroxide-simeth (MAALOX/MYLANTA) 200-200-20 MG/5ML suspension 30 mL  30 mL Oral Q4H PRN Thermon Leyland, NP      . escitalopram (LEXAPRO) tablet 15 mg  15 mg Oral Daily Himabindu Ravi, MD   15 mg at 12/20/15 0818  . ibuprofen (ADVIL,MOTRIN) tablet 600 mg  600 mg Oral Q8H PRN Thermon Leyland, NP      . insulin aspart (novoLOG) injection 0-15 Units  0-15 Units Subcutaneous TID WC Rachael Fee, MD   3 Units at 12/20/15 1220  . insulin aspart (novoLOG) injection 6 Units  6 Units Subcutaneous TID WC  Jeralyn Bennett, MD   6 Units at 12/20/15 1221  . insulin glargine (LANTUS) injection 18 Units  18 Units Subcutaneous QHS Jeralyn Bennett, MD   18 Units at 12/19/15 2154  . magnesium hydroxide (MILK OF MAGNESIA) suspension 30 mL  30 mL Oral Daily PRN Thermon Leyland, NP      . multivitamin with minerals tablet 1 tablet  1 tablet Oral Daily Sanjuana Kava, NP   1 tablet at 12/20/15 0819  . nicotine (NICODERM CQ - dosed in mg/24 hours) patch 14 mg  14 mg Transdermal Daily Thermon Leyland, NP   14 mg at 12/19/15 0854  . risperiDONE (RISPERDAL) tablet 0.5 mg  0.5 mg Oral BID Thermon Leyland, NP   0.5 mg at 12/20/15 0819  . thiamine (VITAMIN B-1) tablet 100 mg  100 mg Oral Daily Sanjuana Kava, NP   100 mg at 12/20/15 0819  . traZODone (DESYREL) tablet 50 mg  50 mg Oral QHS Sanjuana Kava, NP   50 mg at 12/18/15 2253    Lab Results:  Results for orders placed or performed during the hospital encounter of 12/15/15 (from the past 48 hour(s))  Glucose, capillary     Status: Abnormal   Collection Time: 12/18/15  5:13 PM  Result Value Ref Range   Glucose-Capillary 294 (H) 65 - 99 mg/dL  Glucose, capillary     Status: Abnormal   Collection Time: 12/18/15  9:12 PM  Result Value Ref Range   Glucose-Capillary 231 (H) 65 - 99 mg/dL   Comment 1 Notify RN   Glucose, capillary     Status: Abnormal   Collection Time: 12/19/15  5:56 AM  Result Value Ref Range   Glucose-Capillary 283 (H) 65 - 99 mg/dL   Comment 1 Notify RN   Glucose, capillary     Status: Abnormal   Collection Time: 12/19/15 12:07 PM  Result Value Ref Range   Glucose-Capillary 261 (H) 65 - 99 mg/dL  Glucose, capillary     Status: Abnormal   Collection Time: 12/19/15  5:18 PM  Result Value Ref Range   Glucose-Capillary 340 (H) 65 - 99 mg/dL  Glucose, capillary     Status: Abnormal   Collection Time: 12/19/15  8:57 PM  Result Value Ref Range   Glucose-Capillary 429 (H) 65 - 99 mg/dL  Glucose, capillary     Status: Abnormal   Collection Time:  12/20/15  6:21 AM  Result Value Ref Range   Glucose-Capillary 222 (H) 65 - 99 mg/dL  Glucose, capillary     Status: Abnormal   Collection Time: 12/20/15 12:14 PM  Result Value Ref Range   Glucose-Capillary 186 (H) 65 - 99 mg/dL   Comment 1 Notify RN     Blood Alcohol level:  Lab Results  Component Value Date   ETH 55* 12/15/2015   ETH 87* 12/12/2015    Physical Findings: AIMS: Facial and Oral Movements Muscles of Facial Expression: None, normal Lips and Perioral Area: None, normal Jaw: None, normal Tongue: None, normal,Extremity Movements Upper (arms, wrists, hands, fingers): None, normal Lower (legs, knees, ankles, toes): None, normal, Trunk Movements Neck, shoulders, hips: None, normal, Overall Severity Severity of abnormal movements (highest score from questions above): None, normal Incapacitation due to abnormal movements: None, normal Patient's awareness of abnormal movements (rate only patient's report): No Awareness, Dental Status Current problems with teeth and/or dentures?: No Does patient usually wear dentures?: No  CIWA:  CIWA-Ar Total: 1 COWS:  COWS Total Score: 1  Musculoskeletal: Strength & Muscle Tone: within normal limits Gait & Station: normal Patient leans: N/A  Psychiatric Specialty Exam: Review of Systems  Constitutional: Positive for malaise/fatigue.  HENT: Negative.   Eyes: Negative.   Respiratory: Negative.   Cardiovascular: Negative.   Gastrointestinal: Positive for vomiting.  Genitourinary: Negative.   Musculoskeletal: Negative.   Skin: Negative.   Neurological: Positive for weakness.  Endo/Heme/Allergies: Negative.   Psychiatric/Behavioral: Positive for depression and substance abuse (Alcoholism, chronic). Negative for suicidal ideas, hallucinations and memory loss. The patient is nervous/anxious and has insomnia.     Blood pressure 109/68, pulse 93, temperature 98.5 F (36.9 C), temperature source Oral, resp. rate 16, height   (1.702 m), weight 68.493 kg (151 lb), SpO2 97 %.Body mass index is 23.64 kg/(m^2).  General Appearance: Casual  Eye Contact:: Good  Speech: Normal Rate  Volume: Normal  Mood: Depressed  Affect: Congruent and Flat  Thought Process: Coherent  Orientation: Full (Time, Place, and Person)  Thought Content:normal  Suicidal Thoughts: denies  Homicidal Thoughts: No  Memory: Immediate; Good Recent; Good Remote; Good  Judgement: Fair  Insight: Fair  Psychomotor Activity: Normal  Concentration: Good  Recall: Good  Fund of Knowledge:Good  Language: Good  Akathisia: No  Handed: Right  AIMS (if indicated):    Assets: Communication Skills Desire for Improvement  ADL's: Intact  Cognition: WNL  Sleep: Number of Hours: 6.75         Treatment Plan Summary: Daily contact with patient to assess and evaluate symptoms and progress in treatment and Medication management: Daily contact with patient to assess and evaluate symptoms and progress in treatment Medication management: Continue the [facility] detox protocols for alcohol detox.  Plan: Supportive approach/coping skills/relapse prevention           Depression: Increase Lexapro to  po qd. Insomnia: continue Trazodone 50 mg,  Continue to work with mindfulness, CBT help  identify the cognitive distortions that keep the depression going/alcoholism.           Encourage patient to attend groups.          Discuss other life style changes that can help with both his depression and his alcohol use such like exercise, meditation           Mood control: will continue the Risperdal 0.5 mg bid.  Will use motivational interviewing and encourage medication management & counseling services while inpatient & after discharge.           Continue home medication regimen for the other medical issues.           Continue to educate on the effects of substance abuse and other recovery  strategies.  Diabetes Obtain hospitalist, consult to address his elevated blood sugars.  Lindwood Qua, NP  [facility] 12/20/2015, 3:34 PM

## 2015-12-20 NOTE — Plan of Care (Signed)
Problem: Alteration in mood Goal: LTG-Patient reports reduction in suicidal thoughts (Patient reports reduction in suicidal thoughts and is able to verbalize a safety plan for whenever patient is feeling suicidal)  Outcome: Progressing Pt denies SI at this time     

## 2015-12-21 LAB — GLUCOSE, CAPILLARY
GLUCOSE-CAPILLARY: 199 mg/dL — AB (ref 65–99)
GLUCOSE-CAPILLARY: 243 mg/dL — AB (ref 65–99)
Glucose-Capillary: 326 mg/dL — ABNORMAL HIGH (ref 65–99)
Glucose-Capillary: 334 mg/dL — ABNORMAL HIGH (ref 65–99)

## 2015-12-21 MED ORDER — RISPERIDONE 0.5 MG PO TABS: 0.5000 mg | ORAL_TABLET | Freq: Two times a day (BID) | ORAL | Status: AC

## 2015-12-21 MED ORDER — NICOTINE 14 MG/24HR TD PT24
14.0000 mg | MEDICATED_PATCH | Freq: Every day | TRANSDERMAL | Status: AC
Start: 2015-12-21 — End: ?

## 2015-12-21 MED ORDER — TRAZODONE HCL 50 MG PO TABS: 50.0000 mg | ORAL_TABLET | Freq: Every day | ORAL | Status: AC

## 2015-12-21 MED ORDER — INSULIN ASPART 100 UNIT/ML ~~LOC~~ SOLN: 6.0000 [IU] | Freq: Three times a day (TID) | SUBCUTANEOUS | Status: AC

## 2015-12-21 MED ORDER — INSULIN GLARGINE 100 UNIT/ML ~~LOC~~ SOLN: 24.0000 [IU] | Freq: Every day | SUBCUTANEOUS | Status: AC

## 2015-12-21 MED ORDER — ESCITALOPRAM OXALATE 5 MG PO TABS
15.0000 mg | ORAL_TABLET | Freq: Every day | ORAL | Status: AC
Start: 2015-12-21 — End: ?

## 2015-12-21 NOTE — Progress Notes (Signed)
Patient ID: [name] Kennedy, male   DOB: 06/04/1966, 50 y.o.   MRN: [id number]   Pt currently presents with a flat affect and sullen behavior. Pt interacts positively with peers but forwards little to Clinical research associatewriter. Pt seen playing chess in the dayroom with another pt for most of the day.   Pt provided with medications per providers orders. Pt's labs and vitals were monitored throughout the day. Pt supported emotionally and encouraged to express concerns and questions. Pt educated on medications.  Pt's safety ensured with 15 minute and environmental checks. Pt currently denies SI/HI and A/V hallucinations. Pt verbally agrees to seek staff if SI/HI or A/VH occurs and to consult with staff before acting on any harmful thoughts. Pt to be discharged tomorrow morning. Pt plan is to go to live with his sister. Will continue POC.  All papers and prescriptions were reviewed.Verbal understanding expressed. Pt given opportunity to express concerns and ask questions.

## 2015-12-21 NOTE — Progress Notes (Signed)
  [facility] :  Will you be returning to the same living situation after discharge:  No.Pt moving to [place] with his sister at discharge.  At discharge, do you have transportation home?: Yes,  pt provided with bus passes and train ticket to [place]. Pt must d/c no later than 7:00AM on Friday 12/22/15 Do you have the ability to pay for your medications: Yes,  mental health  Release of information consent forms completed and submitted to medical records by CSW.  Patient to Follow up at: Follow-up Information    Follow up with [facility].   Why:  Walk in between 8am-9am Monday through Friday for hospital follow-up/medication management/assessment for services. Ask about ACTT [place] Please bring photo ID with you to this appt. Thank you.    Contact information:   [address] [place], [place] [address] Phone: [phone] Fax: [phone]      Next level of care provider has access to [facility] Link:no  Safety Planning and Suicide Prevention discussed: Yes,  SPE completed with pt, as he declined to consent to family contact. SPI pamphlet and Mobile Crisis information provided to pt and he was encouraged to share information with support network, ask questions, and talk about any concerns relating to SPE.  Have you used any form of tobacco in the last 30 days? (Cigarettes, Smokeless Tobacco, Cigars, and/or Pipes): Yes  Has patient been referred to the Quitline?: Patient refused referral  Patient has been referred for addiction treatment: Yes  Smart, Ajaya Crutchfield LCSW 12/21/2015, 2:27 PM

## 2015-12-21 NOTE — [facility] Group Notes (Signed)
[facility] LCSW Group Therapy  12/21/2015 3:43 PM  Type of Therapy:  Group Therapy  Participation Level:  Active  Participation Quality:  Attentive  Affect:  Appropriate  Cognitive:  Alert and Oriented  Insight:  Engaged  Engagement in Therapy:  Engaged  Modes of Intervention:  Confrontation, Discussion, Education, Exploration, Problem-solving, Rapport Building, Socialization and Support  Summary of Progress/Problems: Feelings around Relapse. Group members discussed the meaning of relapse and shared personal stories of relapse, how it affected them and others, and how they perceived themselves during this time. Group members were encouraged to identify triggers, warning signs and coping skills used when facing the possibility of relapse. Social supports were discussed and explored in detail. Post Acute Withdrawal Syndrome (handout provided) was introduced and examined. Pt's were encouraged to ask questions, talk about key points associated with PAWS, and process this information in terms of relapse prevention. [name] Kennedy was attentive and engaged during today's processing group. He shared his personal experiences with PAWS and acknowledged the importance of creating a safety plan. He reported that he plans to live with [place] sister in [place] after discharge rather than return to the shelter. "She is hard on me in a good way. She makes sure I take my medications and go to meetings."   Smart, Lytle ButteHeather LCSW 12/21/2015, 3:43 PM
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
## 2015-12-21 NOTE — Plan of Care (Signed)
Problem: Alteration in mood & ability to function due to Goal: STG: Patient verbalizes decreases in signs of withdrawal Outcome: Completed/Met Date Met:  12/21/15 Pt reports that he "feels fine" today, denies any signs or symptoms of withdrawal.

## 2015-12-21 NOTE — Progress Notes (Signed)
D: Pt denies SI/HI/AVH. Pt is pleasant and cooperative. Pt stated he was ready to leave, pt excited about going to [place].   A: Pt was offered support and encouragement. Pt was given scheduled medications. Pt was encourage to attend groups. Q 15 minute checks were done for safety.    R:Pt attends groups and interacts well with peers and staff. Pt is taking medication. Pt has no complaints at this time .Pt receptive to treatment and safety maintained on unit.

## 2015-12-21 NOTE — [facility] Suicide Risk Assessment (Signed)
[facility]   Principal Problem: MDD (major depressive disorder), recurrent severe, without psychosis (HCC) Discharge Diagnoses:  Patient Active Problem List   Diagnosis Date Noted  . Alcohol use disorder (HCC) [F10.99]   . Cocaine dependence with cocaine-induced mood disorder (HCC) [F14.24]   . MDD (major depressive disorder), recurrent severe, without psychosis (HCC) [F33.2] 12/13/2015  . Cocaine-induced mood disorder (HCC) [F14.94] 12/12/2015  . Alcohol dependence with uncomplicated withdrawal (HCC) [F10.230]   . Insomnia [G47.00]   . 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 Cocaine abuse [F14.10] 10/24/2015  . Alcohol-induced mood disorder (HCC) [F10.94] 06/26/2015  . Substance induced mood disorder (HCC) [F19.94] 06/26/2015  . Uncomplicated alcohol dependence (HCC) [F10.20]   . Alcohol dependence (HCC) [F10.20] 01/24/2013  . Cocaine dependence (HCC) [F14.20] 01/24/2013  . Suicidal ideation [R45.851] 01/23/2013    Total Time spent with patient: 30 minutes  Musculoskeletal: Strength & Muscle Tone: within normal limits Gait & Station: normal Patient leans: N/A  Psychiatric Specialty Exam: Review of Systems  Psychiatric/Behavioral: Positive for substance abuse. Negative for depression, suicidal ideas and hallucinations.  All other systems reviewed and are negative.   Blood pressure 110/69, pulse 89, temperature 97.9 F (36.6 C), temperature source Oral, resp. rate 18, height 5\' 7"  (1.702 m), weight 68.493 kg (151 lb), SpO2 97 %.Body mass index is 23.64 kg/(m^2).  General Appearance: Casual  Eye Contact::  Fair  Speech:  Clear and Coherent409  Volume:  Normal  Mood:  Euthymic  Affect:  Appropriate  Thought Process:  Coherent  Orientation:  Full (Time, Place, and Person)  Thought Content:  WDL  Suicidal Thoughts:  No  Homicidal Thoughts:  No  Memory:  Immediate;   Fair Recent;   Fair Remote;   Fair   Judgement:  Fair  Insight:  Fair  Psychomotor Activity:  Normal  Concentration:  Fair  Recall:  Fis[employer]Fair  Fund of Knowledge:Fair  Language: Fair  Akathisia:  No  Handed:  Right  AIMS (if indicated):     Assets:  Desire for Improvement  Sleep:  Number of Hours: 6.5  Cognition: WNL  ADL's:  Intact   Mental Status Per Nursing Assessment::   On Admission:  Suicidal ideation indicated by patient, Suicide plan, Self-harm thoughts, Self-harm behaviors  Demographic Factors:  Male  Loss Factors: NA  Historical Factors: Impulsivity  Risk Reduction Factors:   Positive social support  Continued Clinical Symptoms:  Alcohol/Substance Abuse/Dependencies Previous Psychiatric Diagnoses and Treatments  Cognitive Features That Contribute To Risk:  None    Suicide Risk:  Minimal: No identifiable suicidal ideation.  Patients presenting with no risk factors but with morbid ruminations; may be classified as minimal risk based on the severity of the depressive symptoms  Follow-up Information    Follow up with [facility].   Why:  Walk in between 8am-9am Monday through Friday for hospital follow-up/medication management/assessment for services. Ask about ACTT paperwork[place]. Please bring photo ID with you to this appt. Thank you.    Contact information:   [address] [place], [place] [address] Phone: [phone] Fax: [phone]      Plan Of Care/Follow-up recommendations:  Activity:  no restrictions Diet:  regular Tests:  as needed Other:  follow up with aftercare  Heather Streeper, MD 12/21/2015, 3:03 PM

## 2015-12-21 NOTE — Progress Notes (Addendum)
Adult Psychoeducational Group Note  Date:  12/21/2015 Time:  9:33 AM  Group Topic/Focus:  Developing a Wellness Toolbox:   The focus of this group is to help patients develop a "wellness toolbox" with skills and strategies to promote recovery upon discharge.  Participation Level:  Did Not Attend  Participation Quality:  N/a  Affect: n/a  Cognitive: n/a  Insight: n/a  Engagement in Group: n/a  Modes of Intervention:  Activity, Discussion, Education and Support  Additional Comments:  Pt did not attend, pt in bed asleep.   Aurora Maskwyman, Kelsee Preslar E 12/21/2015, 9:33 AM

## 2015-12-21 NOTE — Progress Notes (Signed)
[facility]  12/21/2015 4:36 PM [name] FlemingsOscar Kennedy  MRN:  [id number]  Subjective: "My meds seems to be working?"  Objective: Patient was seen today, chart reviewed. Patient was discussed in treatment team this morning. Per staff patient has been admitted multiple times to this unit. He stated that he had been homeless for 5 years and was raised by his grandma.  He is not isolating in his room has been attending any groups. He continues to report feeling depressed and states that his medications need to be changed.  Lexapro 15 mg seems to be working well.  Patient urged to attend groups and and leave his room.  Patient is due to be discharged in the morning.  He is headed to [place].  He is future and goal oriented.  Denies SI HI AVH.  Principal Problem: Alcohol use disorder, dependence, Cocaine dependence with cocaine-induced mood disorder (HCC)  Diagnosis:   Patient Active Problem List   Diagnosis Date Noted  . Alcohol use disorder (HCC) [F10.99]   . Cocaine dependence with cocaine-induced mood disorder (HCC) [F14.24]   . MDD (major depressive disorder), recurrent severe, without psychosis (HCC) [F33.2] 12/13/2015  . Cocaine-induced mood disorder (HCC) [F14.94] 12/12/2015  . Alcohol dependence with uncomplicated withdrawal (HCC) [F10.230]   . Insomnia [G47.00]   . 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 Cocaine abuse [F14.10] 10/24/2015  . Alcohol-induced mood disorder (HCC) [F10.94] 06/26/2015  . Substance induced mood disorder (HCC) [F19.94] 06/26/2015  . Uncomplicated alcohol dependence (HCC) [F10.20]   . Alcohol dependence (HCC) [F10.20] 01/24/2013  . Cocaine dependence (HCC) [F14.20] 01/24/2013  . Suicidal ideation [R45.851] 01/23/2013   Total Time spent with patient: 25 minutes  Past Psychiatric History: Hx: Alcogholism, chronic  Past Medical History:  Past Medical History  Diagnosis Date  . Suicidal ideations   . Diabetes  mellitus without complication (HCC)   . Alcohol abuse   . Major depressive disorder (HCC)   . Bipolar disorder (HCC)    History reviewed. No pertinent past surgical history. Family History: History reviewed. No pertinent family history. Family Psychiatric  History: See H&P  Social History:  History  Alcohol Use  . Yes    Comment: "as muck as I can consume"     History  Drug Use  . Yes  . Special: Cocaine    Comment: last used 12/14/15    Social History   Social History  . Marital Status: Single    Spouse Name: N/A  . Number of Children: N/A  . Years of Education: N/A   Social History Main Topics  . Smoking status: Current Every Day Smoker -- 0.00 packs/day    Types: Cigarettes  . Smokeless tobacco: None  . Alcohol Use: Yes     Comment: "as muck as I can consume"  . Drug Use: Yes    Special: Cocaine     Comment: last used 12/14/15  . Sexual Activity: Not Asked   Other Topics Concern  . None   Social History Narrative   Additional Social History:     Sleep: Good  Appetite:  Fair  Current Medications: Current Facility-Administered Medications  Medication Dose Route Frequency Provider Last Rate Last Dose  . acetaminophen (TYLENOL) tablet 650 mg  650 mg Oral Q6H PRN Thermon LeylandLaura A Davis, NP      . alum & mag hydroxide-simeth (MAALOX/MYLANTA) 200-200-20 MG/5ML suspension 30 mL  30 mL Oral Q4H PRN Thermon LeylandLaura A Davis, NP      .  escitalopram (LEXAPRO) tablet 15 mg  15 mg Oral Daily Himabindu Ravi, MD   15 mg at 12/21/15 0749  . ibuprofen (ADVIL,MOTRIN) tablet 600 mg  600 mg Oral Q8H PRN Thermon Leyland, NP      . insulin aspart (novoLOG) injection 0-20 Units  0-20 Units Subcutaneous TID WC Kerry Hough, PA-C   15 Units at 12/21/15 1210  . insulin aspart (novoLOG) injection 0-5 Units  0-5 Units Subcutaneous QHS Kerry Hough, PA-C   5 Units at 12/20/15 2116  . insulin aspart (novoLOG) injection 6 Units  6 Units Subcutaneous TID WC Jeralyn Bennett, MD   6 Units at 12/21/15 1209   . insulin glargine (LANTUS) injection 24 Units  24 Units Subcutaneous QHS Kerry Hough, PA-C   24 Units at 12/20/15 2116  . magnesium hydroxide (MILK OF MAGNESIA) suspension 30 mL  30 mL Oral Daily PRN Thermon Leyland, NP      . multivitamin with minerals tablet 1 tablet  1 tablet Oral Daily Sanjuana Kava, NP   1 tablet at 12/21/15 0750  . nicotine (NICODERM CQ - dosed in mg/24 hours) patch 14 mg  14 mg Transdermal Daily Thermon Leyland, NP   14 mg at 12/19/15 0854  . risperiDONE (RISPERDAL) tablet 0.5 mg  0.5 mg Oral BID Thermon Leyland, NP   0.5 mg at 12/21/15 0750  . thiamine (VITAMIN B-1) tablet 100 mg  100 mg Oral Daily Sanjuana Kava, NP   100 mg at 12/21/15 0750  . traZODone (DESYREL) tablet 50 mg  50 mg Oral QHS Sanjuana Kava, NP   50 mg at 12/18/15 2253    Lab Results:  Results for orders placed or performed during the hospital encounter of 12/15/15 (from the past 48 hour(s))  Glucose, capillary     Status: Abnormal   Collection Time: 12/19/15  5:18 PM  Result Value Ref Range   Glucose-Capillary 340 (H) 65 - 99 mg/dL  Glucose, capillary     Status: Abnormal   Collection Time: 12/19/15  8:57 PM  Result Value Ref Range   Glucose-Capillary 429 (H) 65 - 99 mg/dL  Glucose, capillary     Status: Abnormal   Collection Time: 12/20/15  6:21 AM  Result Value Ref Range   Glucose-Capillary 222 (H) 65 - 99 mg/dL  Glucose, capillary     Status: Abnormal   Collection Time: 12/20/15 12:14 PM  Result Value Ref Range   Glucose-Capillary 186 (H) 65 - 99 mg/dL   Comment 1 Notify RN   Glucose, capillary     Status: Abnormal   Collection Time: 12/20/15  4:59 PM  Result Value Ref Range   Glucose-Capillary 378 (H) 65 - 99 mg/dL   Comment 1 Notify RN   Glucose, capillary     Status: Abnormal   Collection Time: 12/20/15  8:52 PM  Result Value Ref Range   Glucose-Capillary 433 (H) 65 - 99 mg/dL  Glucose, capillary     Status: Abnormal   Collection Time: 12/21/15  6:04 AM  Result Value Ref Range    Glucose-Capillary 199 (H) 65 - 99 mg/dL  Glucose, capillary     Status: Abnormal   Collection Time: 12/21/15 12:04 PM  Result Value Ref Range   Glucose-Capillary 326 (H) 65 - 99 mg/dL   Comment 1 Notify RN     Blood Alcohol level:  Lab Results  Component Value Date   ETH 55* 12/15/2015   ETH 87* 12/12/2015  Physical Findings: AIMS: Facial and Oral Movements Muscles of Facial Expression: None, normal Lips and Perioral Area: None, normal Jaw: None, normal Tongue: None, normal,Extremity Movements Upper (arms, wrists, hands, fingers): None, normal Lower (legs, knees, ankles, toes): None, normal, Trunk Movements Neck, shoulders, hips: None, normal, Overall Severity Severity of abnormal movements (highest score from questions above): None, normal Incapacitation due to abnormal movements: None, normal Patient's awareness of abnormal movements (rate only patient's report): No Awareness, Dental Status Current problems with teeth and/or dentures?: No Does patient usually wear dentures?: No  CIWA:  CIWA-Ar Total: 0 COWS:  COWS Total Score: 1  Musculoskeletal: Strength & Muscle Tone: within normal limits Gait & Station: normal Patient leans: N/A  Psychiatric Specialty Exam: Review of Systems  Constitutional: Positive for malaise/fatigue.  HENT: Negative.   Eyes: Negative.   Respiratory: Negative.   Cardiovascular: Negative.   Gastrointestinal: Positive for vomiting.  Genitourinary: Negative.   Musculoskeletal: Negative.   Skin: Negative.   Neurological: Positive for weakness.  Endo/Heme/Allergies: Negative.   Psychiatric/Behavioral: Positive for depression and substance abuse (Alcoholism, chronic). Negative for suicidal ideas, hallucinations and memory loss. The patient is nervous/anxious and has insomnia.     Blood pressure 110/69, pulse 89, temperature 97.9 F (36.6 C), temperature source Oral, resp. rate 18, height  (1.702 m), weight 68.493 kg (151 lb), SpO2 97  %.Body mass index is 23.64 kg/(m^2).  General Appearance: Casual  Eye Contact:: Good  Speech: Normal Rate  Volume: Normal  Mood: Depressed  Affect: Congruent and Flat  Thought Process: Coherent  Orientation: Full (Time, Place, and Person)  Thought Content:normal  Suicidal Thoughts: denies  Homicidal Thoughts: No  Memory: Immediate; Good Recent; Good Remote; Good  Judgement: Fair  Insight: Fair  Psychomotor Activity: Normal  Concentration: Good  Recall: Good  Fund of Knowledge:Good  Language: Good  Akathisia: No  Handed: Right  AIMS (if indicated):    Assets: Communication Skills Desire for Improvement  ADL's: Intact  Cognition: WNL  Sleep: Number of Hours: 6.75         Treatment Plan Summary: Daily contact with patient to assess and evaluate symptoms and progress in treatment and Medication management: Daily contact with patient to assess and evaluate symptoms and progress in treatment Medication management: Continue the [facility] detox protocols for alcohol detox.  Plan: Supportive approach/coping skills/relapse prevention           Depression: Increase Lexapro to  po qd. Insomnia: continue Trazodone 50 mg,  Continue to work with mindfulness, CBT help  identify the cognitive distortions that keep the depression going/alcoholism.           Encourage patient to attend groups.          Discuss other life style changes that can help with both his depression and his alcohol use such like exercise, meditation           Mood control: will continue the Risperdal 0.5 mg bid.  Will use motivational interviewing and encourage medication management & counseling services while inpatient & after discharge.           Continue home medication regimen for the other medical issues.           Continue to educate on the effects of substance abuse and other recovery strategies.  Diabetes Obtain hospitalist, consult to address his  elevated blood sugars.  Lindwood Qua, NP [facility] 12/21/2015, 4:36 PM

## 2015-12-22 LAB — GLUCOSE, CAPILLARY: Glucose-Capillary: 224 mg/dL — ABNORMAL HIGH (ref 65–99)

## 2015-12-22 NOTE — Progress Notes (Signed)
Nurs Dischg Note:  D:Patient denies SI/HI/AVH at this time. Pt appears calm and cooperative, and no distress noted.   A: All Personal items in locker returned to pt. Pt given his prescriptions and medications, bus passes.  Pt escorted out of the building.  R:  Pt States he will comply with outpatient services, and take MEDS as prescribed.

## 2015-12-22 NOTE — Discharge Summary (Signed)
Physician Discharge Summary Note  Patient:  [name] Kennedy is an 50 y.o., male MRN:  [id number] DOB:  10/30/1965 Patient phone:  [phone] (home)  [place]   [place] [id number],  Total Time spent with patient: 30 minutes  Date of Admission:  12/15/2015 Date of Discharge: 12/22/2015  Reason for Admission:  Suicidal ideations  Principal Problem: MDD (major depressive disorder), recurrent severe, without psychosis [facility](HCC) Discharge Diagnoses: Patient Active Problem List   Diagnosis Date Noted  . Alcohol use disorder (HCC) [F10.99]   . Cocaine dependence with cocaine-induced mood disorder (HCC) [F14.24]   . MDD (major depressive disorder), recurrent severe, without psychosis (HCC) [F33.2] 12/13/2015  . Cocaine-induced mood disorder (HCC) [F14.94] 12/12/2015  . Alcohol dependence with uncomplicated withdrawal (HCC) [F10.230]   . Insomnia [G47.00]   . 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 Cocaine abuse [F14.10] 10/24/2015  . Alcohol-induced mood disorder (HCC) [F10.94] 06/26/2015  . Substance induced mood disorder (HCC) [F19.94] 06/26/2015  . Uncomplicated alcohol dependence (HCC) [F10.20]   . Alcohol dependence (HCC) [F10.20] 01/24/2013  . Cocaine dependence (HCC) [F14.20] 01/24/2013  . Suicidal ideation [R45.851] 01/23/2013    Past Psychiatric History:  See above noted  Past Medical History:  Past Medical History  Diagnosis Date  . Suicidal ideations   . Diabetes mellitus without complication (HCC)   . Alcohol abuse   . Major depressive disorder (HCC)   . Bipolar disorder (HCC)    History reviewed. No pertinent past surgical history. Family History: History reviewed. No pertinent family history. Family Psychiatric  History:  Denied Social History:  History  Alcohol Use  . Yes    Comment: "as muck as I can consume"     History  Drug Use  . Yes  . Special: Cocaine    Comment: last used 12/14/15    Social  History   Social History  . Marital Status: Single    Spouse Name: N/A  . Number of Children: N/A  . Years of Education: N/A   Social History Main Topics  . Smoking status: Current Every Day Smoker -- 0.00 packs/day    Types: Cigarettes  . Smokeless tobacco: None  . Alcohol Use: Yes     Comment: "as muck as I can consume"  . Drug Use: Yes    Special: Cocaine     Comment: last used 12/14/15  . Sexual Activity: Not Asked   Other Topics Concern  . None   Social History Narrative    Hospital Course:  [name] Kennedy, 50 year old African-American male  [facility] with complaints of Suicidal ideations.  He reported being prescribed meds for depression and auditory hallucinations.  He does not recall names and unclear if he had been compliant on the meds.    [name] Kennedy was admitted for MDD (major depressive disorder), recurrent severe, without psychosis (HCC) and crisis management.  He was treated with Lexapro 15mg  depression, Trazodone 50 mg for insomnia and Risperdal 0.5 mg mood control.    Improvement was monitored by observation and [name] Kennedy daily report of symptom reduction.  Emotional and mental status was monitored by daily self inventory reports completed by [name] Kennedy and clinical staff.  Patient reported continued improvement, denied any new concerns.  Patient had been compliant on medications and denied side effects.  Support and encouragement was provided.    Patient did well during inpatient stay.  At time of discharge,  patient rated both depression and anxiety levels to be manageable and minimal.  Patient was encouraged to attend groups able to identify the triggers of emotional crises and de-stabilizations.  Patient identified the positive things in life that would help in dealing with feelings of loss, depression and unhealthy or abusive tendencies.         [name] Kennedy was evaluated by the treatment team for  stability and plans for continued recovery upon discharge.  He was offered further treatment options upon discharge including Residential, Intensive Outpatient and Outpatient treatment.  He will follow up with agencies listed below for medication management and counseling.  Encouraged patient to maintain satisfactory support network and home environment.  Advised to adhere to medication compliance and outpatient treatment follow up.      [name] Kennedy motivation was an integral factor for scheduling further treatment.  Employment, transportation, bed availability, health status, family support, and any pending legal issues were also considered during his hospital stay.  Upon completion of this admission the patient was both mentally and medically stable for discharge denying suicidal/homicidal ideation, auditory/visual/tactile hallucinations, delusional thoughts and paranoia.      Physical Findings: AIMS: Facial and Oral Movements Muscles of Facial Expression: None, normal Lips and Perioral Area: None, normal Jaw: None, normal Tongue: None, normal,Extremity Movements Upper (arms, wrists, hands, fingers): None, normal Lower (legs, knees, ankles, toes): None, normal, Trunk Movements Neck, shoulders, hips: None, normal, Overall Severity Severity of abnormal movements (highest score from questions above): None, normal Incapacitation due to abnormal movements: None, normal Patient's awareness of abnormal movements (rate only patient's report): No Awareness, Dental Status Current problems with teeth and/or dentures?: No Does patient usually wear dentures?: No  CIWA:  CIWA-Ar Total: 0 COWS:  COWS Total Score: 1  Musculoskeletal: Strength & Muscle Tone: within normal limits Gait & Station: normal Patient leans: N/A  Psychiatric Specialty Exam:  SEE MD SRA Review of Systems  Psychiatric/Behavioral: Negative for suicidal ideas and hallucinations. Depression: improved.  All other systems reviewed  and are negative.   Blood pressure 110/69, pulse 89, temperature 97.9 F (36.6 C), temperature source Oral, resp. rate 18, height  (1.702 m), weight 68.493 kg (151 lb), SpO2 97 %.Body mass index is 23.64 kg/(m^2).  Have you used any form of tobacco in the last 30 days? (Cigarettes, Smokeless Tobacco, Cigars, and/or Pipes): Yes  Has this patient used any form of tobacco in the last 30 days? (Cigarettes, Smokeless Tobacco, Cigars, and/or Pipes) Yes, N/A  Blood Alcohol level:  Lab Results  Component Value Date   ETH 55* 12/15/2015   ETH 87* 12/12/2015    Metabolic Disorder Labs:  No results found for: HGBA1C, MPG No results found for: PROLACTIN No results found for: CHOL, TRIG, HDL, CHOLHDL, VLDL, LDLCALC  See Psychiatric Specialty Exam and Suicide Risk Assessment completed by Attending Physician prior to discharge.  Discharge destination:  Home  Is patient on multiple antipsychotic therapies at discharge:  No   Has Patient had three or more failed trials of antipsychotic monotherapy by history:  No  Recommended Plan for Multiple Antipsychotic Therapies: NA     Medication List    TAKE these medications      Indication   escitalopram 5 MG tablet  Commonly known as:  LEXAPRO  Take 3 tablets (15 mg total) by mouth daily.   Indication:  Major Depressive Disorder     insulin aspart 100 UNIT/ML injection  Commonly known as:  novoLOG  Inject 6 Units into  the skin 3 (three) times daily with meals.   Indication:  Type 2 Diabetes     insulin glargine 100 UNIT/ML injection  Commonly known as:  LANTUS  Inject 0.24 mLs (24 Units total) into the skin at bedtime.   Indication:  Type 2 Diabetes     nicotine 14 mg/24hr patch  Commonly known as:  NICODERM CQ - dosed in mg/24 hours  Place 1 patch (14 mg total) onto the skin daily.   Indication:  Nicotine Addiction     risperiDONE 0.5 MG tablet  Commonly known as:  RISPERDAL  Take 1 tablet (0.5 mg total) by mouth 2 (two) times  daily.   Indication:  mood stabilization     traZODone 50 MG tablet  Commonly known as:  DESYREL  Take 1 tablet (50 mg total) by mouth at bedtime.   Indication:  Trouble Sleeping           Follow-up Information    Follow up with [facility].   Why:  Walk in between 8am-9am Monday through Friday for hospital follow-up/medication management/assessment for services. Ask about ACTT paperwork--if it can be transferred from [facility]. Please bring photo ID with you to this appt. Thank you.    Contact information:   [address] [place], [place] [address] Phone: [phone] Fax: [phone]      Follow-up recommendations:  Activity:  as tol Diet:  as tol  Comments:  1.  Take all your medications as prescribed.   2.  Report any adverse side effects to outpatient provider. 3.  Patient instructed to not use alcohol or illegal drugs while on prescription medicines. 4.  In the event of worsening symptoms, instructed patient to call 911, the crisis hotline or go to nearest emergency room for evaluation of symptoms.  Signed: Lindwood Qua, NP [facility] 12/22/2015, 2:14 PM

## 2016-04-02 DIAGNOSIS — F209 Schizophrenia, unspecified: Secondary | ICD-10-CM

## 2016-04-02 DIAGNOSIS — E119 Type 2 diabetes mellitus without complications: Principal | ICD-10-CM

## 2016-04-02 DIAGNOSIS — F1010 Alcohol abuse, uncomplicated: Secondary | ICD-10-CM

## 2016-04-02 DIAGNOSIS — E1165 Type 2 diabetes mellitus with hyperglycemia: Secondary | ICD-10-CM

## 2016-04-02 DIAGNOSIS — R45851 Suicidal ideations: Principal | ICD-10-CM

## 2016-04-02 DIAGNOSIS — F17200 Nicotine dependence, unspecified, uncomplicated: Secondary | ICD-10-CM

## 2016-04-02 MED ORDER — UNKNOWN TO PATIENT
Status: SS
Start: 2016-04-02 — End: 2016-04-11

## 2016-04-03 ENCOUNTER — Inpatient Hospital Stay: Admit: 2016-04-03 | Discharge: 2016-04-03

## 2016-04-03 MED ORDER — MORPHINE SULFATE 2 MG/ML IV SOLN CUSTOM JX
2 mg | Freq: Once | INTRAVENOUS | Status: DC
Start: 2016-04-03 — End: 2016-04-03

## 2016-04-03 MED ORDER — INSULIN REGULAR HUMAN 100 UNIT/ML IJ SOLN
5 [IU] | Freq: Once | SUBCUTANEOUS | Status: CP
Start: 2016-04-03 — End: ?

## 2016-04-03 MED ORDER — BOLUS IV FLUID JX
Freq: Once | INTRAVENOUS | Status: CP
Start: 2016-04-03 — End: ?

## 2016-04-03 MED ORDER — BOLUS IV FLUID JX
1000 mL | Freq: Once | INTRAVENOUS | Status: CP
Start: 2016-04-03 — End: ?

## 2016-04-03 NOTE — ED Provider Notes
Height 04/02/16 2216 1.676 m   Weight 04/02/16 2216 70.3 kg   SpO2 04/02/16 2213 99 %   BMI (Calculated) 04/02/16 2216 25.07       BP 116/72 - Pulse 100 - Temp 36.7 ?C (98 ?F) (Oral)  - Resp 20 - Ht 1.676 m - Wt 70.3 kg - SpO2 99% - BMI 25.02 kg/m2    Physical Exam   Constitutional: He is oriented to person, place, and time. He appears well-developed.   HENT:   Head: Normocephalic and atraumatic.   Right Ear: External ear normal.   Left Ear: External ear normal.   Eyes: Conjunctivae and EOM are normal. Pupils are equal, round, and reactive to light. Right eye exhibits no discharge. Left eye exhibits no discharge. No scleral icterus.   Neck: Normal range of motion.   Cardiovascular: Normal rate, regular rhythm, normal heart sounds and intact distal pulses.  Exam reveals no gallop and no friction rub.    No murmur heard.  Pulmonary/Chest: Effort normal and breath sounds normal. No respiratory distress. He has no wheezes. He has no rales. He exhibits no tenderness.   Abdominal: Soft. Bowel sounds are normal. He exhibits no distension and no mass. There is no tenderness. There is no rebound and no guarding.   Musculoskeletal: Normal range of motion.   Neurological: He is alert and oriented to person, place, and time. No cranial nerve deficit.   Skin: Skin is warm and dry. He is not diaphoretic.   Nursing note and vitals reviewed.      Differential DDx: DKA, HHS, and others     Is this an Emergent Medical Condition? Yes - Severe Pain/Acute Onset of Symptons  409.901 FS  641.19 FS  627.732 (16) FS    ED Workup   Procedures    Labs:  -   BASIC METABOLIC PANEL - Abnormal        Result Value Ref Range    Chloride 98 (*) 101 - 110 mmol/L    Glucose 350 (*) 71 - 99 mg/dL    Sodium 161136  096135 - 045145 mmol/L    Potassium 3.7  3.3 - 4.6 mmol/L    CO2 23  21 - 29 mmol/L    Urea Nitrogen 8  6 - 22 mg/dL    Creatinine 4.090.76  8.110.67 - 1.17 mg/dL    BUN/Creatinine Ratio 10.5  6.0 - 22.0 (calc)    Calcium 8.9  8.6 - 10.0 mg/dL

## 2016-04-03 NOTE — ED Provider Notes
Glucose (Meter) 240 (*) 60.0 - 99.0 mg/dL   BETA HYDROXYBUTYRATE - Normal    B-Hydroxybutyrate 0.8  0.2 - 2.8 mg/dL   PHOSPHORUS - Normal    Phosphorus,Inorganic 3.3  2.5 - 4.5 mg/dL   POCT GLUCOSE   POCT GLUCOSE   POCT GLUCOSE   CBC AND DIFFERENTIAL         Imaging (Read by ED Provider)  No results found.    EKG (Read by ED Provider):  not applicable    MDM  Number of Diagnoses or Management Options  Schizophrenia, unspecified type:   Suicidal ideation:      Amount and/or Complexity of Data Reviewed  Clinical lab tests: ordered and reviewed        ED Course & Re-Evaluation     ED Course   Comment By Time   Evaluated pt. Hx of diabetes, schizophrenia, off medicaitons for both. Recently released from alcohol withdrawal program. Denies CP/HA/AP/Back pain/F/C/N/V. States slight blurry vision d/t diabetes Melvin NorlanderJiang, Robert, MD 08/29 2238   BA by Scarlette Slicejso, states not on medications 2/2 being in alcohol rehab program. States in the past has walked in front of a bus Melvin NorlanderJiang, Robert, MD 08/29 2246   Melvin Ellis is a 50 y.o. M coming in with the chief complaint of suicidal ideation. Patient with a past history of diabetes, alcohol abuse, [facility] [place] [facility]. Patient states that he was unable to find a ride earlier today, and became suicidal. Patient with a past history of suicidal attempts, stepping in front of a bus. Patient denies access to gun.     POCT blood glucose  vital signs reassuring  physical exam unremarkable  baker acted by [facility]  Med noncompliance    04/02/2016 2233 Melvin Norlanderobert Jiang, M.D.    Glucose elevated, labs ordered for DKA/HHS.  04/03/2016 0115 Melvin Norlanderobert Jiang, M.D.    Labwork without evidence of DKA, fluids, insulin ordered. Plan to be checked POCT  04/03/2016 0145 Melvin Norlanderobert Jiang, M.D.    Repeat blood glucose improving, patient clear for ETU evaluation  04/03/2016 0350 Melvin Norlanderobert Jiang, M.D.    ETU to lift Baker act, agree with their assessment.  ETU's and my history

## 2016-04-03 NOTE — ED Provider Notes
pain/F/C/N/V. States slight blurry vision d/t diabetes Jiang, Robert, MD 08/29 2238         Janora NorlanderED Disposition   ED Disposition: No ED Disposition Set      ED Clinical Impression   ED Clinical Impression:   No Clinical Impression Set      ED Patient Status   Patient Status:   {[facility]:[phone]}        ED Medical Evaluation Initiated   Medical Evaluation Initiated:   Yes, filed at 04/02/16 2233  by Janora NorlanderJiang, Robert, MD

## 2016-04-03 NOTE — Progress Notes
ETU:    Patient is a 50 y/o [place] male presenting on a BA-52 via [facility] secondary to report of SI. Per triage RN, "50 yo black male amb with [facility] from the bus station under a baker act. Per the officer, pt was made to leave the bus and then was unable to find a place to go. Pt admits to becoming suicidal after he was unable to find a ride." Patient is not known to this Clinical research associatewriter or to [facility] ED. Patient told ED physician that he "had schizophrenia & the diabetes". He has been sleeping comfortably in obs room since his arrival, was pleasant when this writer woke him up for eval.    On interview, patient is A&Ox4 w/ fairly euthymic mood & congruent affect. Speech appropriate/relevant. Thought process decidedly linear/intact - does not appear to be attending to internal stimuli @ this time. No objective signs/sxs of psychosis or mania noted. Currently in green ED paper scrubs, not disheveled or malodorous, well-kempt. Patient states he is from [place], [place], came down to [place] on the [employer] to go to [facility] facility here in [place], [address] Patient states that he was upset that he couldn't find a ride to the facility from the bus station, "and plus, they don't open til 9 in the morning anyways" & so he flagged down a [facility] to endorse SI -- "I knew they'd bring me to a hospital for the night".  Patient states he is motivated to continue tx for his ETOH abuse, states that he came to [place] b/c this program is "affordable & kind of like a work-release thing". He also admits to using cocaine on a "pretty regular basis". Verified that there is, in fact, a [facility] facility here in [place], Lexicographercontact info listed below. This Clinical research associatewriter asked patient if he was still suicidal, as he has been @ our ED since 2200 yesterday evening - patient replied, "well, until morning time I am". Asked patient what type of medications he's supposed to be taking,

## 2016-04-03 NOTE — ED Provider Notes
States in the past has walked in front of a bus Janora NorlanderJiang, Robert, MD 08/29 2246   Blood glucose elevated 393, checked labs for DKA. Labs without acidosis, without AG, negative beta hydroxy  Janora Norlanderobert Jiang, MD 1:45 AM 04/03/2016  Glucose improved to 240. Medically cleared for ETU evaluation  Janora Norlanderobert Jiang, MD 3:38 AM 04/03/2016    ED Disposition   ED Disposition: No ED Disposition Set      ED Clinical Impression   ED Clinical Impression:   Suicidal ideation  Schizophrenia, unspecified type      ED Patient Status   Patient Status:   [facility]:[phone]}        ED Medical Evaluation Initiated   Medical Evaluation Initiated:   Yes, filed at 04/02/16 2233  by Janora NorlanderJiang, Robert, MD

## 2016-04-03 NOTE — ED Provider Notes
Calcium 8.9  8.6 - 10.0 mg/dL    Osmolality Calc 284.3      Anion Gap 15  4 - 16 mmol/L    EGFR >59  mL/min/1.73M2    Comment:   Reference range: =>90 ml/min/1.73M2  eGFR estimates are unable to accurately differentiate levels of GFR above 60 ml/min/1.73M2.   MAGNESIUM - Abnormal     Magnesium 1.7 (*) 1.8 - 2.6 mg/dL   CBC AUTODIFF - Abnormal     RBC 3.70 (*) 4.50 - 6.30 x10E6/uL    Hemoglobin 9.9 (*) 14.0 - 18.0 g/dL    Hematocrit 31.1 (*) 40.0 - 54.0 %    MCH 26.8 (*) 27.0 - 34.0 pg    MPV 11.6 (*) 9.5 - 11.5 fl    Lymphocytes % 20.7 (*) 25.0 - 45.0 %    Monocytes % 10.3 (*) 2.0 - 6.0 %    WBC 8.03  4.5 - 11 x10E3/uL    MCV 84.1  82.0 - 101.0 fl    MCHC 31.8  31.0 - 36.0 g/dL    RDW 13.5  12.0 - 16.1 %    Platelet Count 286  140 - 440 thou/cu mm    nRBC % 0.0  0.0 - 1.0 %    Absolute NRBC Count 0.00      Neutrophils % 67.1  34.0 - 73.0 %    Eosinophils % 1.1  1.0 - 4.0 %    Immature Granulocytes % 0.2  0.0 - 2.0 %    Neutrophils Absolute 5.38  1.80 - 8.70 x10E3/uL    Lymphocytes Absolute 1.66  x10E3/uL    Monocytes Absolute 0.83  x10E3/uL    Eosinophils Absolute 0.09  x10E3/uL    Basophil Absolute 0.05  x10E3/uL    Basophils % 0.6  0 - 1 %   POCT GLUCOSE - Abnormal     Glucose (Meter) 393 (*) 60.0 - 99.0 mg/dL   BETA HYDROXYBUTYRATE - Normal    B-Hydroxybutyrate 0.8  0.2 - 2.8 mg/dL   PHOSPHORUS - Normal    Phosphorus,Inorganic 3.3  2.5 - 4.5 mg/dL   POCT GLUCOSE   POCT GLUCOSE   POCT GLUCOSE   CBC AND DIFFERENTIAL         Imaging (Read by ED Provider)    EKG (Read by ED Provider):  {EKG [phone]}    MDM    ED Course & Re-Evaluation     ED Course   Comment By Time   Evaluated pt. Hx of diabetes, schizophrenia, off medicaitons for both. Recently released from alcohol withdrawal program. Denies CP/HA/AP/Back pain/F/C/N/V. States slight blurry vision d/t diabetes Chinita Pester, MD 08/29 2238   BA by [facility], states not on medications 2/2 being in alcohol rehab program.

## 2016-04-03 NOTE — ED Provider Notes
Osmolality Calc 284.3      Anion Gap 15  4 - 16 mmol/L    EGFR >59  mL/min/1.73M2    Comment:   Reference range: =>90 ml/min/1.73M2  eGFR estimates are unable to accurately differentiate levels of GFR above 60 ml/min/1.73M2.   MAGNESIUM - Abnormal     Magnesium 1.7 (*) 1.8 - 2.6 mg/dL   CBC AUTODIFF - Abnormal     RBC 3.70 (*) 4.50 - 6.30 x10E6/uL    Hemoglobin 9.9 (*) 14.0 - 18.0 g/dL    Hematocrit 31.1 (*) 40.0 - 54.0 %    MCH 26.8 (*) 27.0 - 34.0 pg    MPV 11.6 (*) 9.5 - 11.5 fl    Lymphocytes % 20.7 (*) 25.0 - 45.0 %    Monocytes % 10.3 (*) 2.0 - 6.0 %    WBC 8.03  4.5 - 11 x10E3/uL    MCV 84.1  82.0 - 101.0 fl    MCHC 31.8  31.0 - 36.0 g/dL    RDW 13.5  12.0 - 16.1 %    Platelet Count 286  140 - 440 thou/cu mm    nRBC % 0.0  0.0 - 1.0 %    Absolute NRBC Count 0.00      Neutrophils % 67.1  34.0 - 73.0 %    Eosinophils % 1.1  1.0 - 4.0 %    Immature Granulocytes % 0.2  0.0 - 2.0 %    Neutrophils Absolute 5.38  1.80 - 8.70 x10E3/uL    Lymphocytes Absolute 1.66  x10E3/uL    Monocytes Absolute 0.83  x10E3/uL    Eosinophils Absolute 0.09  x10E3/uL    Basophil Absolute 0.05  x10E3/uL    Basophils % 0.6  0 - 1 %   POCT GLUCOSE - Abnormal     Glucose (Meter) 393 (*) 60.0 - 99.0 mg/dL   POCT GLUCOSE - Abnormal     Glucose (Meter) 240 (*) 60.0 - 99.0 mg/dL   BETA HYDROXYBUTYRATE - Normal    B-Hydroxybutyrate 0.8  0.2 - 2.8 mg/dL   PHOSPHORUS - Normal    Phosphorus,Inorganic 3.3  2.5 - 4.5 mg/dL   POCT GLUCOSE   POCT GLUCOSE   POCT GLUCOSE   CBC AND DIFFERENTIAL         Imaging (Read by ED Provider)    EKG (Read by ED Provider):  not applicable    MDM    ED Course & Re-Evaluation     ED Course   Comment By Time   Evaluated pt. Hx of diabetes, schizophrenia, off medicaitons for both. Recently released from alcohol withdrawal program. Denies CP/HA/AP/Back pain/F/C/N/V. States slight blurry vision d/t diabetes Chinita Pester, MD 08/29 2238   BA by [facility], states not on medications 2/2 being in alcohol rehab program.

## 2016-04-03 NOTE — ED Provider Notes
History     Chief Complaint   Patient presents with   ? Suicidal       HPI Comments: Melvin Ellis is a 50 y.o. male  has a past medical history of Alcohol abuse; Diabetes mellitus; and Schizophrenia., coming in for Suicidal.  Patient states that       The history is provided by the patient.       No Known Allergies    Patient's Medications   New Prescriptions    No medications on file   Previous Medications    UNKNOWN TO PATIENT       Modified Medications    No medications on file   Discontinued Medications    No medications on file       Past Medical History:   Diagnosis Date   ? Alcohol abuse    ? Diabetes mellitus    ? Schizophrenia        History reviewed. No pertinent surgical history.    No family history on file.    Social History     Social History   ? Marital status: Single     Spouse name: N/A   ? Number of children: N/A   ? Years of education: N/A     Social History Main Topics   ? Smoking status: Current Every Day Smoker   ? Smokeless tobacco: Never Used   ? Alcohol use Yes   ? Drug use: Yes     Special: Cocaine   ? Sexual activity: Not Asked     Other Topics Concern   ? None     Social History Narrative   ? None       Review of Systems   Constitutional: Negative for fever, chills, diaphoresis, activity change, appetite change and unexpected weight change.   Eyes: Positive for visual disturbance.   Respiratory: Negative for shortness of breath and wheezing.    Cardiovascular: Negative for chest pain and palpitations.   Gastrointestinal: Negative for nausea, vomiting, abdominal pain, diarrhea and constipation.   Genitourinary: Negative for dysuria.   Musculoskeletal: Negative for joint swelling and arthralgias.   Skin: Negative for rash.   Neurological: Negative for numbness and headaches.       Physical Exam       ED Triage Vitals   BP 04/02/16 2213 115/74   Pulse 04/02/16 2213 115   Resp 04/02/16 2213 20   Temp 04/02/16 2213 37.1 ?C (98.7 ?F)   Temp src 04/02/16 2213 Oral

## 2016-04-03 NOTE — ED Provider Notes
Respiratory: Negative for shortness of breath and wheezing.    Cardiovascular: Negative for chest pain and palpitations.   Gastrointestinal: Negative for nausea, vomiting, abdominal pain, diarrhea and constipation.   Genitourinary: Negative for dysuria.   Musculoskeletal: Negative for joint swelling and arthralgias.   Skin: Negative for rash.   Neurological: Negative for numbness and headaches.       Physical Exam       ED Triage Vitals   BP 04/02/16 2213 115/74   Pulse 04/02/16 2213 115   Resp 04/02/16 2213 20   Temp 04/02/16 2213 37.1 ?C (98.7 ?F)   Temp src 04/02/16 2213 Oral   Height 04/02/16 2216 1.676 m   Weight 04/02/16 2216 70.3 kg   SpO2 04/02/16 2213 99 %   BMI (Calculated) 04/02/16 2216 25.07       BP 124/74 - Pulse 90 - Temp 36.8 ?C (98.3 ?F) (Oral)  - Resp 18 - Ht 1.676 m - Wt 70.3 kg - SpO2 99% - BMI 25.02 kg/m2    Physical Exam   Constitutional: He is oriented to person, place, and time. He appears well-developed.   HENT:   Head: Normocephalic and atraumatic.   Right Ear: External ear normal.   Left Ear: External ear normal.   Eyes: Conjunctivae and EOM are normal. Pupils are equal, round, and reactive to light. Right eye exhibits no discharge. Left eye exhibits no discharge. No scleral icterus.   Neck: Normal range of motion.   Cardiovascular: Normal rate, regular rhythm, normal heart sounds and intact distal pulses.  Exam reveals no gallop and no friction rub.    No murmur heard.  Pulmonary/Chest: Effort normal and breath sounds normal. No respiratory distress. He has no wheezes. He has no rales. He exhibits no tenderness.   Abdominal: Soft. Bowel sounds are normal. He exhibits no distension and no mass. There is no tenderness. There is no rebound and no guarding.   Musculoskeletal: Normal range of motion.   Neurological: He is alert and oriented to person, place, and time. No cranial nerve deficit.   Skin: Skin is warm and dry. He is not diaphoretic.

## 2016-04-03 NOTE — Progress Notes
patient noted "just for the sugars", referring to his DM & insulin, mentioned nothing re: supposed schizophrenia dx.    Patient does not meet Child psychotherapist Act/inpatient psych admission criteria @ this time. Denies HI/AVH, admits to endorsing SI so that [facility] would transport him to local hospital, where he "figured he could spend the night". Patient may very well be dx w/ Schizophrenia, although he certainly does not appear decompensated @ this time, or even actively psychotic. Patient appears to be very well versed in what to say to various law enforcement officers & health care providers in order to get his temporary social needs met. Patient's speech is decidedly future-oriented, asking this Probation officer if Probation officer could arrange transportation to facility for him. Spoke w/ oncall psychiatrist Dr. Rory Percy, who agrees w/ this writer's assessment & recommends vacating BA. Spoke w/ ED physician, who also agrees w/ this writer's assessment & concurs that patient was using ED for shelter this morning. D/c to community.    Axis I:  ETOH Dependence    Axis II:  Deferred    Axis III:  DM    Axis IV:  Substance abuse    Axis V:  75

## 2016-04-03 NOTE — ED Provider Notes
Psychiatric: He expresses suicidal ideation. He expresses suicidal plans.   Nursing note and vitals reviewed.      Differential DDx: DKA, HHS, and others     Is this an Emergent Medical Condition? Yes - Severe Pain/Acute Onset of Symptons  409.901 FS  641.19 FS  627.732 (16) FS    ED Workup   Procedures    Labs:  -   BASIC METABOLIC PANEL - Abnormal        Result Value Ref Range    Chloride 98 (*) 101 - 110 mmol/L    Glucose 350 (*) 71 - 99 mg/dL    Sodium 136  135 - 145 mmol/L    Potassium 3.7  3.3 - 4.6 mmol/L    CO2 23  21 - 29 mmol/L    Urea Nitrogen 8  6 - 22 mg/dL    Creatinine 0.76  0.67 - 1.17 mg/dL    BUN/Creatinine Ratio 10.5  6.0 - 22.0 (calc)    Calcium 8.9  8.6 - 10.0 mg/dL    Osmolality Calc 284.3      Anion Gap 15  4 - 16 mmol/L    EGFR >59  mL/min/1.73M2    Comment:   Reference range: =>90 ml/min/1.73M2  eGFR estimates are unable to accurately differentiate levels of GFR above 60 ml/min/1.73M2.   MAGNESIUM - Abnormal     Magnesium 1.7 (*) 1.8 - 2.6 mg/dL   CBC AUTODIFF - Abnormal     RBC 3.70 (*) 4.50 - 6.30 x10E6/uL    Hemoglobin 9.9 (*) 14.0 - 18.0 g/dL    Hematocrit 31.1 (*) 40.0 - 54.0 %    MCH 26.8 (*) 27.0 - 34.0 pg    MPV 11.6 (*) 9.5 - 11.5 fl    Lymphocytes % 20.7 (*) 25.0 - 45.0 %    Monocytes % 10.3 (*) 2.0 - 6.0 %    WBC 8.03  4.5 - 11 x10E3/uL    MCV 84.1  82.0 - 101.0 fl    MCHC 31.8  31.0 - 36.0 g/dL    RDW 13.5  12.0 - 16.1 %    Platelet Count 286  140 - 440 thou/cu mm    nRBC % 0.0  0.0 - 1.0 %    Absolute NRBC Count 0.00      Neutrophils % 67.1  34.0 - 73.0 %    Eosinophils % 1.1  1.0 - 4.0 %    Immature Granulocytes % 0.2  0.0 - 2.0 %    Neutrophils Absolute 5.38  1.80 - 8.70 x10E3/uL    Lymphocytes Absolute 1.66  x10E3/uL    Monocytes Absolute 0.83  x10E3/uL    Eosinophils Absolute 0.09  x10E3/uL    Basophil Absolute 0.05  x10E3/uL    Basophils % 0.6  0 - 1 %   POCT GLUCOSE - Abnormal     Glucose (Meter) 393 (*) 60.0 - 99.0 mg/dL   POCT GLUCOSE - Abnormal

## 2016-04-03 NOTE — ED Provider Notes
History     Chief Complaint   Patient presents with   ? Suicidal       HPI    No Known Allergies    Patient's Medications   New Prescriptions    No medications on file   Previous Medications    UNKNOWN TO PATIENT       Modified Medications    No medications on file   Discontinued Medications    No medications on file       Past Medical History:   Diagnosis Date   ? Alcohol abuse    ? Diabetes mellitus    ? Schizophrenia        History reviewed. No pertinent surgical history.    No family history on file.    Social History     Social History   ? Marital status: Single     Spouse name: N/A   ? Number of children: N/A   ? Years of education: N/A     Social History Main Topics   ? Smoking status: Current Every Day Smoker   ? Smokeless tobacco: Never Used   ? Alcohol use Yes   ? Drug use: Yes     Special: Cocaine   ? Sexual activity: Not Asked     Other Topics Concern   ? None     Social History Narrative   ? None       Review of Systems    Physical Exam       ED Triage Vitals   BP 04/02/16 2213 115/74   Pulse 04/02/16 2213 115   Resp 04/02/16 2213 20   Temp 04/02/16 2213 37.1 ?C (98.7 ?F)   Temp src 04/02/16 2213 Oral   Height 04/02/16 2216 1.676 m   Weight 04/02/16 2216 70.3 kg   SpO2 04/02/16 2213 99 %   BMI (Calculated) 04/02/16 2216 25.07       BP 116/72 - Pulse 100 - Temp 36.7 ?C (98 ?F) (Oral)  - Resp 20 - Ht 1.676 m - Wt 70.3 kg - SpO2 99% - BMI 25.02 kg/m2    Physical Exam    Differential DDx: ***    Is this an Emergent Medical Condition? {SH ED EMERGENT MEDICAL CONDITION:[phone]}  409.901 FS  641.19 FS  627.732 (16) FS    ED Workup   Procedures    Labs:  -   BASIC METABOLIC PANEL - Abnormal        Result Value Ref Range    Chloride 98 (*) 101 - 110 mmol/L    Glucose 350 (*) 71 - 99 mg/dL    Sodium 295136  621135 - 308145 mmol/L    Potassium 3.7  3.3 - 4.6 mmol/L    CO2 23  21 - 29 mmol/L    Urea Nitrogen 8  6 - 22 mg/dL    Creatinine 6.570.76  8.460.67 - 1.17 mg/dL    BUN/Creatinine Ratio 10.5  6.0 - 22.0 (calc)

## 2016-04-03 NOTE — ED Provider Notes
History     Chief Complaint   Patient presents with   ? Suicidal       HPI    No Known Allergies    Patient's Medications   New Prescriptions    No medications on file   Previous Medications    UNKNOWN TO PATIENT       Modified Medications    No medications on file   Discontinued Medications    No medications on file       Past Medical History:   Diagnosis Date   ? Alcohol abuse    ? Diabetes mellitus    ? Schizophrenia        History reviewed. No pertinent surgical history.    No family history on file.    Social History     Social History   ? Marital status: N/A     Spouse name: N/A   ? Number of children: N/A   ? Years of education: N/A     Social History Main Topics   ? Smoking status: Current Every Day Smoker   ? Smokeless tobacco: Never Used   ? Alcohol use Yes   ? Drug use: Yes     Special: Cocaine   ? Sexual activity: Not Asked     Other Topics Concern   ? None     Social History Narrative   ? None       Review of Systems    Physical Exam     ED Triage Vitals   BP 04/02/16 2213 115/74   Pulse 04/02/16 2213 115   Resp 04/02/16 2213 90   Temp 04/02/16 2213 37.1 ?C (98.7 ?F)   Temp src 04/02/16 2213 Oral   Height 04/02/16 2216 1.676 m   Weight 04/02/16 2216 70.3 kg   SpO2 04/02/16 2213 99 %   BMI (Calculated) 04/02/16 2216 25.07       BP 115/74 - Pulse 115 - Temp 37.1 ?C (98.7 ?F) (Oral)  - Resp (!) 90 - Ht 1.676 m - Wt 70.3 kg - SpO2 99% - BMI 25.02 kg/m2    Physical Exam    Differential DDx: ***    Is this an Emergent Medical Condition? {SH ED EMERGENT MEDICAL CONDITION:[phone]}  409.901 FS  641.19 FS  627.732 (16) FS    ED Workup   Procedures    Labs:  - - No data to display      Imaging (Read by ED Provider):  {Imaging findings:[phone]}    EKG (Read by ED Provider):  {EKG findings:[phone]}    MDM    ED Course & Re-Evaluation     ED Course   Comment By Time   Evaluated pt. Hx of diabetes, schizophrenia, off medicaitons for both. Recently released from alcohol withdrawal program. Denies CP/HA/AP/Back

## 2016-04-03 NOTE — ED Provider Notes
History     Chief Complaint   Patient presents with   ? Suicidal       HPI Comments: Melvin Ellis is a 50 y.o. male  has a past medical history of Alcohol abuse; Diabetes mellitus; and Schizophrenia., coming in for Suicidal.  Patient with a past history of diabetes, alcohol abuse, [facility]. Patient states that he was unable to find a ride earlier today, and became suicidal. Patient with a past history of suicidal attempts, stepping in front of a bus. Patient denies access to gun.       Patient is a 50 y.o. male presenting with hyperglycemia. The history is provided by the patient.   Hyperglycemia   Onset quality:  Gradual  Chronicity:  New  Diabetes status:  Controlled with insulin  Context: noncompliance    Context: not change in medication    Associated symptoms: no abdominal pain, no chest pain, no diaphoresis, no dysuria, no fever, no nausea, no shortness of breath and no vomiting        No Known Allergies    Discharge Medication List as of 04/03/2016  4:44 AM      CONTINUE these medications which have NOT CHANGED    Details   UNKNOWN TO PATIENT Historical Med             Past Medical History:   Diagnosis Date   ? Alcohol abuse    ? Diabetes mellitus    ? Schizophrenia        History reviewed. No pertinent surgical history.    No family history on file.    Social History     Social History   ? Marital status: Single     Spouse name: N/A   ? Number of children: N/A   ? Years of education: N/A     Social History Main Topics   ? Smoking status: Current Every Day Smoker   ? Smokeless tobacco: Never Used   ? Alcohol use Yes   ? Drug use: Yes     Special: Cocaine   ? Sexual activity: Not Asked     Other Topics Concern   ? None     Social History Narrative   ? None       Review of Systems   Constitutional: Negative for fever, chills, diaphoresis, activity change, appetite change and unexpected weight change.   Eyes: Positive for visual disturbance.

## 2016-04-03 NOTE — ED Provider Notes
States in the past has walked in front of a bus Janora NorlanderJiang, Robert, MD 08/29 2246         ED Disposition   ED Disposition: No ED Disposition Set      ED Clinical Impression   ED Clinical Impression:   Suicidal ideation  Schizophrenia, unspecified type      ED Patient Status   Patient Status:   {[phone]}        ED Medical Evaluation Initiated   Medical Evaluation Initiated:   Yes, filed at 04/02/16 2233  by Janora NorlanderJiang, Robert, MD

## 2016-04-03 NOTE — ED Notes
50 yo black male amb with [facility] from the bus station under a baker act. Per the officer, pt was made to leave the bus and then was unable to find a place to go. Pt admits to becoming suicidal after he was unable to find a ride. To cou for eval

## 2016-04-03 NOTE — ED Provider Notes
obtained reflects malingering. Patient discharged in stable condition, with follow up with PCP. Patient instructed that if symptoms should worsen, return to the emergency department. Patient questions addressed, and patient has understanding of the plan.  04/03/2016 [id number] Janora Norlanderobert Jiang, M.D.      ED Disposition   ED Disposition: Discharge      ED Clinical Impression   ED Clinical Impression:   Suicidal ideation  Schizophrenia, unspecified type      ED Patient Status   Patient Status:   Good        ED Medical Evaluation Initiated   Medical Evaluation Initiated:   Yes, filed at 04/02/16 2233  by Janora NorlanderJiang, Robert, MD             Janora NorlanderJiang, Robert, MD  Resident  04/03/16 [id number]

## 2016-04-03 NOTE — ED Notes
IV removed with catheter intact.  Pressure applied, Time of discharge: 0520  AM., Patient discharged to  Home.  Patient discharged  ambulatory. to exit with belongings in  Stable condition.  Patient escorted by  no one., Written discharge instructions given to  patient.  Patient/recipient  verbalizes discharge instructions.

## 2016-04-06 ENCOUNTER — Inpatient Hospital Stay: Admit: 2016-04-06 | Discharge: 2016-04-11 | Disposition: A | Discharge status: Home / Self Care | Admitting: Psychiatry

## 2016-04-06 DIAGNOSIS — F1020 Alcohol dependence, uncomplicated: Secondary | ICD-10-CM

## 2016-04-06 DIAGNOSIS — Z720 Tobacco use: Secondary | ICD-10-CM

## 2016-04-06 DIAGNOSIS — F209 Schizophrenia, unspecified: Secondary | ICD-10-CM

## 2016-04-06 DIAGNOSIS — F329 Major depressive disorder, single episode, unspecified: Secondary | ICD-10-CM

## 2016-04-06 DIAGNOSIS — F1010 Alcohol abuse, uncomplicated: Secondary | ICD-10-CM

## 2016-04-06 DIAGNOSIS — Z9119 Patient's noncompliance with other medical treatment and regimen: Secondary | ICD-10-CM

## 2016-04-06 DIAGNOSIS — Z590 Homelessness: Secondary | ICD-10-CM

## 2016-04-06 DIAGNOSIS — E119 Type 2 diabetes mellitus without complications: Principal | ICD-10-CM

## 2016-04-06 DIAGNOSIS — F29 Unspecified psychosis not due to a substance or known physiological condition: Secondary | ICD-10-CM

## 2016-04-06 DIAGNOSIS — R000 Tachycardia, unspecified: Secondary | ICD-10-CM

## 2016-04-06 DIAGNOSIS — F1490 Cocaine use, unspecified, uncomplicated: Secondary | ICD-10-CM

## 2016-04-06 DIAGNOSIS — T6594XA Toxic effect of unspecified substance, undetermined, initial encounter: Principal | ICD-10-CM

## 2016-04-06 DIAGNOSIS — F1420 Cocaine dependence, uncomplicated: Secondary | ICD-10-CM

## 2016-04-06 DIAGNOSIS — F819 Developmental disorder of scholastic skills, unspecified: Secondary | ICD-10-CM

## 2016-04-06 DIAGNOSIS — F17200 Nicotine dependence, unspecified, uncomplicated: Secondary | ICD-10-CM

## 2016-04-06 DIAGNOSIS — Z794 Long term (current) use of insulin: Secondary | ICD-10-CM

## 2016-04-06 DIAGNOSIS — D649 Anemia, unspecified: Secondary | ICD-10-CM

## 2016-04-06 MED ORDER — LORAZEPAM 2 MG/ML IJ SOLN
1 mg | INTRAMUSCULAR | Status: DC | PRN
Start: 2016-04-06 — End: 2016-04-11

## 2016-04-06 MED ORDER — INSULIN ASPART 100 UNIT/ML SC SOLN
5 [IU] | Freq: Once | SUBCUTANEOUS | Status: DC
Start: 2016-04-06 — End: 2016-04-06

## 2016-04-06 MED ORDER — DEXTROSE 50 % IV SOLN
30 mL | INTRAVENOUS | Status: DC | PRN
Start: 2016-04-06 — End: 2016-04-11

## 2016-04-06 MED ORDER — HALOPERIDOL 2 MG/1 ML UNIT DOSE ORAL LIQD JX
2 mg | Freq: Three times a day (TID) | ORAL | Status: DC | PRN
Start: 2016-04-06 — End: 2016-04-11

## 2016-04-06 MED ORDER — GUAIFENESIN 300MG/15ML UD
15 mL | Freq: Four times a day (QID) | ORAL | Status: CP | PRN
Start: 2016-04-06 — End: ?

## 2016-04-06 MED ORDER — ACETAMINOPHEN 325 MG PO TABS
650 mg | Freq: Four times a day (QID) | ORAL | Status: CP | PRN
Start: 2016-04-06 — End: ?

## 2016-04-06 MED ORDER — INSULIN ASPART 100 UNIT/ML SC SOLN
0-3 [IU] | Freq: Three times a day (TID) | SUBCUTANEOUS | Status: DC
Start: 2016-04-06 — End: 2016-04-07

## 2016-04-06 MED ORDER — DIPHENHYDRAMINE HCL 50 MG PO CAPS
50 mg | Freq: Every evening | ORAL | Status: DC | PRN
Start: 2016-04-06 — End: 2016-04-11

## 2016-04-06 MED ORDER — ALUM & MAG HYDROX-SIMETH 1200-1200-120 MG/30ML PO SUSP UD
30 mL | ORAL | Status: CP | PRN
Start: 2016-04-06 — End: ?

## 2016-04-06 MED ORDER — LORAZEPAM 1 MG PO TABS
1 mg | ORAL | Status: DC | PRN
Start: 2016-04-06 — End: 2016-04-11

## 2016-04-06 MED ORDER — LORAZEPAM 1 MG/0.5 ML UNIT DOSE ORAL LIQD JX
1 mg | ORAL | Status: DC | PRN
Start: 2016-04-06 — End: 2016-04-11

## 2016-04-06 MED ORDER — ONDANSETRON 4 MG PO TBDP
4 mg | Freq: Four times a day (QID) | ORAL | Status: CP | PRN
Start: 2016-04-06 — End: ?

## 2016-04-06 MED ORDER — GLUCOSE 4 G PO CHEW JX
16 g | ORAL | Status: DC | PRN
Start: 2016-04-06 — End: 2016-04-11

## 2016-04-06 MED ORDER — HALOPERIDOL 2 MG PO TABS
2 mg | Freq: Three times a day (TID) | ORAL | Status: DC | PRN
Start: 2016-04-06 — End: 2016-04-11

## 2016-04-06 MED ORDER — BENZTROPINE MESYLATE 1 MG PO TABS
1 mg | Freq: Four times a day (QID) | ORAL | Status: DC | PRN
Start: 2016-04-06 — End: 2016-04-11

## 2016-04-06 MED ORDER — BENZTROPINE MESYLATE 1 MG/ML IJ SOLN
1 mg | Freq: Four times a day (QID) | INTRAMUSCULAR | Status: DC | PRN
Start: 2016-04-06 — End: 2016-04-11

## 2016-04-06 MED ORDER — HALOPERIDOL LACTATE 5 MG/ML IJ SOLN
2 mg | Freq: Three times a day (TID) | INTRAMUSCULAR | Status: DC | PRN
Start: 2016-04-06 — End: 2016-04-11

## 2016-04-06 NOTE — ED Provider Notes
History   No chief complaint on file.      HPI Comments: 49y/o AA M with pmh of DM, alcohol abuse, schizophrenia who presents for suicidal ideations. Patient reports he wants to walk out infront of traffic. Suicidal ideations for the last month. Not compliant with medications. Currently homeless. Endorsed smoking a substance to triage, however only endorses tobacco and alcohol use to me.     Patient is a 50 y.o. male presenting with mental health issue. The history is provided by the patient and medical records.   Mental Health Problem   This is a recurrent problem. The current episode started 1 to 4 weeks ago. The problem occurs constantly. The problem has been unchanged. Pertinent negatives include no abdominal pain, anorexia, arthralgias, change in bowel habit, chest pain, chills, congestion, coughing, diaphoresis, fatigue, fever, headaches, joint swelling, myalgias, nausea, neck pain, numbness, rash, sore throat, swollen glands, urinary symptoms, vertigo, visual change, vomiting or weakness. Exacerbated by: noncompliance. He has tried nothing for the symptoms.       No Known Allergies    Patient's Medications   New Prescriptions    No medications on file   Previous Medications    UNKNOWN TO PATIENT       Modified Medications    No medications on file   Discontinued Medications    No medications on file       Past Medical History:   Diagnosis Date   ? Alcohol abuse    ? Diabetes mellitus    ? Schizophrenia        History reviewed. No pertinent surgical history.    No family history on file.    Social History     Social History   ? Marital status: Single     Spouse name: N/A   ? Number of children: N/A   ? Years of education: N/A     Social History Main Topics   ? Smoking status: Current Every Day Smoker   ? Smokeless tobacco: Never Used   ? Alcohol use Yes   ? Drug use: Yes     Special: Cocaine   ? Sexual activity: Not Asked     Other Topics Concern   ? None     Social History Narrative       Review of Systems

## 2016-04-06 NOTE — ED Provider Notes
suicidal ideations. Patient reports he wants to walk out infront of traffic. Suicidal ideations for the last month. Not compliant with medications. Currently homeless. Endorsed smoking a substance to triage, however only endorses tobacco and alcohol use to me.   Tachycardia, suspect from smoking substance.  ETU consulted.  Regan Rakersaitlin S Borkowski, MD 6:36 AM 04/06/2016      ED Disposition   ED Disposition: No ED Disposition Set      ED Clinical Impression   ED Clinical Impression:   Ingestion of substance, undetermined intent, initial encounter      ED Patient Status   Patient Status:   {[facility][phone]}        ED Medical Evaluation Initiated   Medical Evaluation Initiated:   Yes, filed at 04/06/16 0600  by Wende BushyBorkowski, Caitlin Suzanne, MD

## 2016-04-06 NOTE — ED Provider Notes
medications. Currently homeless. Endorsed smoking a substance to triage, however only endorses tobacco and alcohol use to me.   Tachycardia, suspect from smoking substance.  Accucheck 260s. Does not know what insulin regimen he is supposed to be on.   ETU consulted.  Regan Rakersaitlin S Borkowski, MD 6:36 AM 04/06/2016    ETU recommends Baker Act. Will place patient under Baker Act. Patient is medically cleared to transfer to psych facility.   Regan Rakersaitlin S Borkowski, MD 7:37 AM 04/06/2016      ED Disposition   ED Disposition: Transfer to Another Facility      ED Clinical Impression   ED Clinical Impression:   Ingestion of substance, undetermined intent, initial encounter  Tobacco use  Schizophrenia, unspecified type  Diabetes mellitus type 2 in nonobese      ED Patient Status   Patient Status:   Fair        ED Medical Evaluation Initiated   Medical Evaluation Initiated:   Yes, filed at 04/06/16 0600  by Wende BushyBorkowski, Caitlin Suzanne, MD             Wende BushyBorkowski, Caitlin Suzanne, MD  Resident  04/06/16 [phone]

## 2016-04-06 NOTE — ED Provider Notes
ED Disposition: No ED Disposition Set      ED Clinical Impression   ED Clinical Impression:   Ingestion of substance, undetermined intent, initial encounter      ED Patient Status   Patient Status:   [facility][phone]}        ED Medical Evaluation Initiated   Medical Evaluation Initiated:   Yes, filed at 04/06/16 0600  by Wende BushyBorkowski, Caitlin Suzanne, MD

## 2016-04-06 NOTE — Progress Notes
ETU:  Pt's chart was reviewed and Pt was interviewed.  Pt was seen for the first time in this ED four days ago.  At that time he was endorsing suicidal ideations and held under a Herbalist Act.  Pt was assessed and found to be seeking admission for secondary gains for basic needs not met in the community and the [employer] Act was eventually vacated.      Currently, Pt arrived to the ED at approximately 0530 hours after using alcohol and smoking "something out of small tube."  He is endorsing auditory hallucinations command type for suicide with a plan to walk out in front of traffic.  Pt continues to endorse suicidal ideation with a plan and auditory hallucinations.  Pt also states he is distressed about being homeless in [place], [place] with no social supports.    Psychiatric:  Schizophrenia.  No medications "for a while" and Pt does not know the medications he has been prescribed in the past, he states, "the kind that stop the voices."  Pt denies any previous inpatient psychiatric admissions.    Social:  Pt states he is originally from [place], [place] and he traveled to [place] "4 or 5 days ago, I came down here for a work program, but didn't nobody pick me up."  Pt states he is roaming the streets of [place] and has no information about the community services available in the area.     MSE:  Pt is a 50 year old Male who appears disheveled, weathered skin and poor hygiene.  His psychomotor activity is calm and he has poor eye contact.  Pt's speech is relevant with no perceptual delays and non elaborative.  He has no insight regarding his mental health illness nor medication.  He denies being intoxicated or under the influence; however, he seems to have poor judgment and impulse control.  He has numerous social stressors wherein his basic needs are not met except what he is provided at the emergency room.  His mood is angry and his affect is

## 2016-04-06 NOTE — ED Notes
BM ambulatory to triage with CC of SI. Sts he is having command auditory hallucinations telling him to hurt himself. Sts he has been out in the streets and has been drinking, but unsure of amount. Sts he wants some help for his mind. Endorses smoking "something out of a small tube". ECG complete.  To ecc for eval.

## 2016-04-06 NOTE — ED Notes
Time of transfer:  1508  PM., Condition at transfer:  Stable., Patient transferred to  [facility]. Patient transferred  via stretcher to exit with belongings in  Stable condition.  Patient escorted by [facility].

## 2016-04-06 NOTE — ED Provider Notes
History   No chief complaint on file.      HPI    No Known Allergies    Patient's Medications   New Prescriptions    No medications on file   Previous Medications    UNKNOWN TO PATIENT       Modified Medications    No medications on file   Discontinued Medications    No medications on file       Past Medical History:   Diagnosis Date   ? Alcohol abuse    ? Diabetes mellitus    ? Schizophrenia        History reviewed. No pertinent surgical history.    No family history on file.    Social History     Social History   ? Marital status: Single     Spouse name: N/A   ? Number of children: N/A   ? Years of education: N/A     Social History Main Topics   ? Smoking status: Current Every Day Smoker   ? Smokeless tobacco: Never Used   ? Alcohol use Yes   ? Drug use: Yes     Special: Cocaine   ? Sexual activity: Not Asked     Other Topics Concern   ? None     Social History Narrative       Review of Systems    Physical Exam     ED Triage Vitals   BP 04/06/16 0532 137/91   Pulse 04/06/16 0532 101   Resp 04/06/16 0532 16   Temp 04/06/16 0532 36.7 ?C (98 ?F)   Temp src --    Height 04/06/16 0532 1.676 m   Weight 04/06/16 0532 70.3 kg   SpO2 04/06/16 0532 99 %   BMI (Calculated) 04/06/16 0532 25.07       BP (!) 137/91 - Pulse 101 - Temp 36.7 ?C (98 ?F) - Resp 16 - Ht 1.676 m - Wt 70.3 kg - SpO2 99% - BMI 25.02 kg/m2    Physical Exam    Differential DDx: ***    Is this an Emergent Medical Condition? {SH ED EMERGENT MEDICAL [phone]}  409.901 FS  641.19 FS  627.732 (16) FS    ED Workup   Procedures    Labs:  -   POCT GLUCOSE - Abnormal        Result Value Ref Range    Glucose (Meter) 266 (*) 60.0 - 99.0 mg/dL   POCT GLUCOSE         Imaging (Read by ED Provider):  {Imaging findings:[phone]}    EKG (Read by ED Provider):  {EKG findings:4696132074}    MDM    ED Course & Re-Evaluation     ED Course     49y/o AA M with pmh of DM, alcohol abuse, schizophrenia who presents for

## 2016-04-06 NOTE — ED Notes
[facility]

## 2016-04-06 NOTE — ED Notes
Provided pt sandwich. Darrell JewelLaura Palmer, EDT

## 2016-04-06 NOTE — ED Notes
BM ambulatory to triage with CC of SI. Sts he is having command auditory hallucinations telling him to hurt himself. Sts he has been out in the streets and has been drinking, but unsure of amount. Sts he wants some help for his mind. Denies drug use. To ecc for eval.

## 2016-04-06 NOTE — ED Notes
Report called to Ishmael HolterBrian, RN on [facility]

## 2016-04-06 NOTE — ED Provider Notes
Constitutional: Negative for fever, chills, diaphoresis and fatigue.   HENT: Negative for nasal congestion, sore throat and neck pain.    Respiratory: Negative for cough.    Cardiovascular: Negative for chest pain.   Gastrointestinal: Negative for nausea, vomiting, abdominal pain, anorexia and change in bowel habit.   Musculoskeletal: Negative for myalgias, joint swelling and arthralgias.   Skin: Negative for rash.   Neurological: Negative for vertigo, weakness, numbness and headaches.   Psychiatric/Behavioral: Positive for suicidal ideas, depression and substance abuse. Negative for self-injury.   All other systems reviewed and are negative.      Physical Exam       ED Triage Vitals   BP 04/06/16 0532 137/91   Pulse 04/06/16 0532 101   Resp 04/06/16 0532 16   Temp 04/06/16 0532 36.7 ?C (98 ?F)   Temp src --    Height 04/06/16 0532 1.676 m   Weight 04/06/16 0532 70.3 kg   SpO2 04/06/16 0532 99 %   BMI (Calculated) 04/06/16 0532 25.07       BP (!) 137/91 - Pulse 101 - Temp 36.7 ?C (98 ?F) - Resp 16 - Ht 1.676 m - Wt 70.3 kg - SpO2 99% - BMI 25.02 kg/m2    Physical Exam   Constitutional: He is oriented to person, place, and time. He appears well-developed and well-nourished. No distress.   HENT:   Head: Normocephalic and atraumatic.   Mouth/Throat: Oropharynx is clear and moist.   Eyes: Pupils are equal, round, and reactive to light.   Neck: Normal range of motion. Neck supple.   Cardiovascular: Regular rhythm and intact distal pulses.    Tachycardia to 102    Radial pulses 2+ b/l  DP pulses 2+ b/l  No lower extremity edema   Pulmonary/Chest: Effort normal and breath sounds normal. No respiratory distress. He has no wheezes. He has no rales.   Lungs CTA bilaterally  No wheezing/rhonchi/rales   Abdominal: Soft. Bowel sounds are normal. He exhibits no distension. There is no tenderness. There is no rebound.   Musculoskeletal: Normal range of motion. He exhibits no edema or tenderness.

## 2016-04-06 NOTE — ED Provider Notes
Neurological: He is alert and oriented to person, place, and time.   Alert, oriented x3  CN 2-12 grossly intact  Sensation to light touch intact in upper and lower extremities b/l  Strength in UE:  Right shoulder abduction/adduction 5/5  Left shoulder abduction/adduction 5/5  Right elbow flexion/extension 5/5  Left elbow flexion/extension 5/5  Right hand grip strength 5/5  Left hand grip strength 5/5  Strength in LE:  Right hip flexion/extension 5/5  Left hip flexion/extension 5/5  Right knee flexion/extension 5/5  Left knee flexion/extension 5/5  Right dorsi/plantar flexion 5/5  Left dorsi/plantar flexion 5/5    Finger to nose without ataxia b/l  No pronator drift  Normal gait   Skin: Skin is warm and dry. No rash noted. He is not diaphoretic.   Psychiatric: He exhibits a depressed mood. He expresses suicidal ideation. He expresses suicidal plans.   Nursing note and vitals reviewed.      Differential DDx: intoxication, ingestion, mania, depression, noncompliance and other    Is this an Emergent Medical Condition? Yes - Severe Pain/Acute Onset of Symptons  409.901 FS  641.19 FS  627.732 (16) FS    ED Workup   Procedures    Labs:  -   POCT GLUCOSE - Abnormal        Result Value Ref Range    Glucose (Meter) 266 (*) 60.0 - 99.0 mg/dL   POCT GLUCOSE         Imaging (Read by ED Provider):  not applicable    EKG (Read by ED Provider):  not applicable    MDM  Number of Diagnoses or Management Options  Ingestion of substance, undetermined intent, initial encounter:   Schizophrenia, unspecified type:   Tobacco use:      Amount and/or Complexity of Data Reviewed  Clinical lab tests: reviewed  Discuss the patient with other providers: yes (ETU)        ED Course & Re-Evaluation     ED Course     49y/o AA M with pmh of DM, alcohol abuse, schizophrenia who presents for suicidal ideations. Patient reports he wants to walk out infront of traffic. Suicidal ideations for the last month. Not compliant with

## 2016-04-06 NOTE — ED Notes
Report given to Diane RN

## 2016-04-06 NOTE — ED Provider Notes
Constitutional: Negative for fever, chills, diaphoresis and fatigue.   HENT: Negative for nasal congestion, sore throat and neck pain.    Respiratory: Negative for cough.    Cardiovascular: Negative for chest pain.   Gastrointestinal: Negative for nausea, vomiting, abdominal pain, anorexia and change in bowel habit.   Musculoskeletal: Negative for myalgias, joint swelling and arthralgias.   Skin: Negative for rash.   Neurological: Negative for vertigo, weakness, numbness and headaches.   Psychiatric/Behavioral: Positive for suicidal ideas, depression and substance abuse. Negative for self-injury.   All other systems reviewed and are negative.      Physical Exam     ED Triage Vitals   BP 04/06/16 0532 137/91   Pulse 04/06/16 0532 101   Resp 04/06/16 0532 16   Temp 04/06/16 0532 36.7 ?C (98 ?F)   Temp src --    Height 04/06/16 0532 1.676 m   Weight 04/06/16 0532 70.3 kg   SpO2 04/06/16 0532 99 %   BMI (Calculated) 04/06/16 0532 25.07       BP (!) 137/91 - Pulse 101 - Temp 36.7 ?C (98 ?F) - Resp 16 - Ht 1.676 m - Wt 70.3 kg - SpO2 99% - BMI 25.02 kg/m2    Physical Exam   Constitutional: He is oriented to person, place, and time. He appears well-developed and well-nourished. No distress.   HENT:   Head: Normocephalic and atraumatic.   Mouth/Throat: Oropharynx is clear and moist.   Eyes: Pupils are equal, round, and reactive to light.   Neck: Normal range of motion. Neck supple.   Cardiovascular: Regular rhythm and intact distal pulses.    Tachycardia to 102    Radial pulses 2+ b/l  DP pulses 2+ b/l  No lower extremity edema   Pulmonary/Chest: Effort normal and breath sounds normal. No respiratory distress. He has no wheezes. He has no rales.   Lungs CTA bilaterally  No wheezing/rhonchi/rales   Abdominal: Soft. Bowel sounds are normal. He exhibits no distension. There is no tenderness. There is no rebound.   Musculoskeletal: Normal range of motion. He exhibits no edema or tenderness.

## 2016-04-06 NOTE — ED Provider Notes
Accucheck 260s. Does not know what insulin regimen he is supposed to be on.   ETU consulted.  Regan Rakersaitlin S Borkowski, MD 6:36 AM 04/06/2016    ETU recommends Baker Act. Will place patient under Baker Act  Regan Rakersaitlin S Borkowski, MD 7:37 AM 04/06/2016      ED Disposition   ED Disposition: No ED Disposition Set      ED Clinical Impression   ED Clinical Impression:   Ingestion of substance, undetermined intent, initial encounter      ED Patient Status   Patient Status:   {[facility][id number]}        ED Medical Evaluation Initiated   Medical Evaluation Initiated:   Yes, filed at 04/06/16 0600  by Wende BushyBorkowski, Caitlin Suzanne, MD

## 2016-04-06 NOTE — ED Provider Notes
Neurological: He is alert and oriented to person, place, and time.   Alert, oriented x3  CN 2-12 grossly intact  Sensation to light touch intact in upper and lower extremities b/l  Strength in UE:  Right shoulder abduction/adduction 5/5  Left shoulder abduction/adduction 5/5  Right elbow flexion/extension 5/5  Left elbow flexion/extension 5/5  Right hand grip strength 5/5  Left hand grip strength 5/5  Strength in LE:  Right hip flexion/extension 5/5  Left hip flexion/extension 5/5  Right knee flexion/extension 5/5  Left knee flexion/extension 5/5  Right dorsi/plantar flexion 5/5  Left dorsi/plantar flexion 5/5    Finger to nose without ataxia b/l  No pronator drift  Normal gait   Skin: Skin is warm and dry. No rash noted. He is not diaphoretic.   Psychiatric: He has a normal mood and affect. His behavior is normal.   Nursing note and vitals reviewed.      Differential DDx: intoxication, ingestion, mania, depression, noncompliance and other    Is this an Emergent Medical Condition? Yes - Severe Pain/Acute Onset of Symptons  409.901 FS  641.19 FS  627.732 (16) FS    ED Workup   Procedures    Labs:  -   POCT GLUCOSE - Abnormal        Result Value Ref Range    Glucose (Meter) 266 (*) 60.0 - 99.0 mg/dL   POCT GLUCOSE         Imaging (Read by ED Provider):  not applicable    EKG (Read by ED Provider):  not applicable    MDM  Number of Diagnoses or Management Options     Amount and/or Complexity of Data Reviewed  Clinical lab tests: reviewed  Discuss the patient with other providers: yes (ETU)        ED Course & Re-Evaluation     ED Course     50y/o AA M with pmh of DM, alcohol abuse, schizophrenia who presents for suicidal ideations. Patient reports he wants to walk out infront of traffic. Suicidal ideations for the last month. Not compliant with medications. Currently homeless. Endorsed smoking a substance to triage, however only endorses tobacco and alcohol use to me.   Tachycardia, suspect from smoking substance.

## 2016-04-06 NOTE — ED Provider Notes
History   No chief complaint on file.      HPI    No Known Allergies    Patient's Medications   New Prescriptions    No medications on file   Previous Medications    UNKNOWN TO PATIENT       Modified Medications    No medications on file   Discontinued Medications    No medications on file       Past Medical History:   Diagnosis Date   ? Alcohol abuse    ? Diabetes mellitus    ? Schizophrenia        History reviewed. No pertinent surgical history.    No family history on file.    Social History     Social History   ? Marital status: Single     Spouse name: N/A   ? Number of children: N/A   ? Years of education: N/A     Social History Main Topics   ? Smoking status: Current Every Day Smoker   ? Smokeless tobacco: Never Used   ? Alcohol use Yes   ? Drug use: Yes     Special: Cocaine   ? Sexual activity: Not Asked     Other Topics Concern   ? None     Social History Narrative       Review of Systems    Physical Exam     ED Triage Vitals   BP 04/06/16 0532 137/91   Pulse 04/06/16 0532 101   Resp 04/06/16 0532 16   Temp 04/06/16 0532 36.7 ?C (98 ?F)   Temp src --    Height 04/06/16 0532 1.676 m   Weight 04/06/16 0532 70.3 kg   SpO2 04/06/16 0532 99 %   BMI (Calculated) 04/06/16 0532 25.07       BP (!) 137/91 - Pulse 101 - Temp 36.7 ?C (98 ?F) - Resp 16 - Ht 1.676 m - Wt 70.3 kg - SpO2 99% - BMI 25.02 kg/m2    Physical Exam    Differential DDx: ***    Is this an Emergent Medical Condition? {SH ED EMERGENT MEDICAL CONDITION:[phone]}  409.901 FS  641.19 FS  627.732 (16) FS    ED Workup   Procedures    Labs:  -   POCT GLUCOSE - Abnormal        Result Value Ref Range    Glucose (Meter) 266 (*) 60.0 - 99.0 mg/dL   POCT GLUCOSE         Imaging (Read by ED Provider):  {Imaging findings:310 203 2708}    EKG (Read by ED Provider):  {EKG findings:[phone]}    MDM    ED Course & Re-Evaluation     ED Course     50y/o AA M with pmh of DM, alcohol abuse, schizophrenia who presents for     ED Disposition

## 2016-04-06 NOTE — ED Provider Notes
Accucheck 230s. Does not know what insulin regimen he is supposed to be on.   ETU consulted.  Melvin Rakersaitlin S Borkowski, MD 6:36 AM 04/06/2016      ED Disposition   ED Disposition: No ED Disposition Set      ED Clinical Impression   ED Clinical Impression:   Ingestion of substance, undetermined intent, initial encounter      ED Patient Status   Patient Status:   {[phone]}        ED Medical Evaluation Initiated   Medical Evaluation Initiated:   Yes, filed at 04/06/16 0600  by Wende BushyBorkowski, Caitlin Suzanne, MD

## 2016-04-06 NOTE — Psych Nurses Notes
49y marginally dressed/groomed AAM admitted to Rm 425 per Dr Wilford CornerArora. Under Ba for auditory hallucinations and SI's to walk into traffic. Skin/contraband check performed with Demetrio LappingKala, nothing to note. Presents with depressed mood, A&OX3, little eye contact, marginal historian, guarded, speech is relevant and organized. Admits to being homeless from [place], no support, and ETOH and cocaine binges. Reports intermittent auditory hall and SI's, will verbally contract for safety. Medical HX of NIDDM. Fair appetite, lab work obtained, oriented to unit and routine, will cont to monitor.

## 2016-04-06 NOTE — Progress Notes
constricted. He consistently endorses auditory hallucinations and suicidal ideations. He states, "I need some help, I don't want to die behind these voices, I need them to stop."    Recommendation:  Initiate a Architectural technologistrofessional Certificate Baker Act due to Pt's repeated suicidal ideation with a plan to walk in front of traffic, auditory hallucinations and vulnerability in the community.  Pt is provided a list of community resources for review and future orientation.    Mood Disorder vs Substance Induced Mood Disorder  Deferred  Diabetes Mellitus  No social support  1231

## 2016-04-06 NOTE — ED Provider Notes
Accucheck 260s. Does not know what insulin regimen he is supposed to be on.   ETU consulted.  Melvin Rakersaitlin S Borkowski, MD 6:36 AM 04/06/2016      ED Disposition   ED Disposition: No ED Disposition Set      ED Clinical Impression   ED Clinical Impression:   Ingestion of substance, undetermined intent, initial encounter      ED Patient Status   Patient Status:   {[phone]}        ED Medical Evaluation Initiated   Medical Evaluation Initiated:   Yes, filed at 04/06/16 0600  by Wende BushyBorkowski, Caitlin Suzanne, MD

## 2016-04-07 MED ORDER — INSULIN GLARGINE 100 UNIT/ML SC SOLN
15 [IU] | Freq: Every day | SUBCUTANEOUS | Status: DC
Start: 2016-04-07 — End: 2016-04-09

## 2016-04-07 MED ORDER — INSULIN ASPART 100 UNIT/ML SC SOLN
5 [IU] | Freq: Three times a day (TID) | SUBCUTANEOUS | Status: DC
Start: 2016-04-07 — End: 2016-04-11

## 2016-04-07 MED ORDER — SERTRALINE HCL 50 MG PO TABS
50 mg | Freq: Every day | ORAL | Status: DC
Start: 2016-04-07 — End: 2016-04-11

## 2016-04-07 NOTE — Consults
04/06/16 0532 70.3 kg (155 lb)   04/06/16 1525 64 kg (141 lb)       General; Not in acute distress    Eyes; No pallor, No Icterus    Skin; No Rash, No ulcers    Neck; No thyromegaly, No Lymphadenopathy    Lungs; No dullness, No wheeze/crackles    Cardiac; No cardiomegaly, no murmurs    Abdomen; Non tender, Normal bowel sounds    Extremities; No edema, No leg ulcers    CNS; Normal Cranial nerves, No weakness      LABS and other tests;   Recent Results (from the past 48 hour(s))   POCT Glucose    Collection Time: 04/06/16  5:44 AM   Result Value Ref Range    Glucose (Meter) 266 (H) 60.0 - 99.0 mg/dL   POCT Glucose    Collection Time: 04/06/16  7:54 AM   Result Value Ref Range    Glucose (Meter) 238 (H) 60.0 - 99.0 mg/dL   POCT Glucose    Collection Time: 04/06/16 10:41 AM   Result Value Ref Range    Glucose (Meter) 239 (H) 60.0 - 99.0 mg/dL   Comprehensive Metabolic Panel    Collection Time: 04/06/16  1:59 PM   Result Value Ref Range    Sodium 143 135 - 145 mmol/L    Potassium 3.9 3.3 - 4.6 mmol/L    Chloride 102 101 - 110 mmol/L    CO2 28 21 - 29 mmol/L    Urea Nitrogen 11 6 - 22 mg/dL    Creatinine 1.03 0.67 - 1.17 mg/dL    BUN/Creatinine Ratio 10.7 6.0 - 22.0 (calc)    Glucose 163 (H) 71 - 99 mg/dL    Calcium 10.1 (H) 8.6 - 10.0 mg/dL    Total Protein 7.0 6.5 - 8.3 g/dL    Albumin 3.7 (L) 3.8 - 4.9 g/dL    Calc Total Globuin 3.3 gm/dL    ALBUMIN/GLOBULIN RATIO 1.1 (calc)    Total Bilirubin 0.1 (L) 0.2 - 1.0 mg/dL    Alkaline Phosphatase 64 40 - 129 IU/L    AST 11 (L) 14 - 33 IU/L    ALT 21 10 - 42 IU/L    Osmolality Calc 288.0     Anion Gap 13 4 - 16 mmol/L    EGFR >59 mL/min/1.73M2   TSH    Collection Time: 04/06/16  1:59 PM   Result Value Ref Range    TSH 0.273 0.270 - 4.200 mIU/L   CBC with Differential panel result    Collection Time: 04/06/16  1:59 PM   Result Value Ref Range    WBC 7.58 4.5 - 11 x10E3/uL    RBC 4.09 (L) 4.50 - 6.30 x10E6/uL    Hemoglobin 11.0 (L) 14.0 - 18.0 g/dL

## 2016-04-07 NOTE — Plan of Care
Problem: Risk for Self Harm  Goal: Denies intent to harm self  Short term   Intervention: Assess risk for self harm  Pt is aox4, denies SI/HI or A/VH at this time. Withdrawn to room other than meals. Compliant with medications and asessment but is hypoverbal, guarded. Does not appear to respond to internal stimuli or act on delusions at this time. Showered this AM without prompting. Denies intent to self harm. Mood is sad per pt. Affect is constricted.

## 2016-04-07 NOTE — Psych H&P
Melvin Ellis***.  At this moment in time the patient is in need of this level of care due to imminent danger to self or others. The preceding assessment is based on the available information at the time of this evaluation.      Justification for admission:  suicide attempt/gesture/ideation.    Admission Diagnoses:  DSM-V Format  Present on Admission:  **None**      Prognosis: fair     Estimated Length of Stay:  4-6 days    Initial Treatment Plan:  - At this moment in time, we are going to keep Melvin Ellis for further treatment and stabilization.   Melvin Ellis has been deemed     {VS IP BAKER ACT COMPENENCE:[id number]}    If patient needs a health care proxy, proxy is :    __ Judicially appointed guardian authorized to consent to medical treatment   __ Person's spouse   __ Adult child of the person   __ Parent of the person   __ Adult relative of the person who has exhibited special care and concern for the person   __ Close friend of the person who has exhibited special care and concern for the person    A Petition for Adjudication of Incompetence to Consent to Treatment and Appointment of a Guardian Advocate will be filed with the court within the time period required by law.  Until the guardian advocate is appointed by the court, a health care surrogate or proxy {actions; will/will [phone]} be asked to make treatment decisions for the above-named person.      -To target the patient's *** symptoms, we will start ***. Risks and benefits of medication were discussed with the patient.     - Ativan {PCA dose:408040} PO/IM q4hours PRN agitation  Haldol {Loading Dose:408030} PO/IM q4hours PRN agitation  - benztropine 1mg  q6h PO/IM PRN acute dystonia or EPS  - Psychotropic medication reconciliation done. Reviewed with patient the indications, risks, benefits, alternatives, and potential consequences of no treatment with psychotropic medications. Patient was given ample time

## 2016-04-07 NOTE — Psych H&P
Melvin Ellis [place] home [place] in [place] and wants help in applying for SSDI. Irritable and guarded about extent of alcohol and cocaine use.  Has h/o learning disorder.  At this moment in time the patient is in need of this level of care due to imminent danger to self or others. The preceding assessment is based on the available information at the time of this evaluation.      Justification for admission:  suicide attempt/gesture/ideation.    Admission Diagnoses:  DSM-V Format  Depressive disorder unspecified.   Alcohol use disorder   Cocaine use disorder    Prognosis: fair     Estimated Length of Stay:  4-6 days    Initial Treatment Plan:  - At this moment in time, we are going to keep Melvin Ellis for further treatment and stabilization.   Melvin Ellis has been deemed competent.       -To target the patient's depressive  symptoms, we will start Zoloft.  Risks and benefits of medication were discussed with the patient.     - Psychotropic medication reconciliation done. Reviewed with patient the indications, risks, benefits, alternatives, and potential consequences of no treatment with psychotropic medications. Patient was given ample time to ask any questions and was in agreement with the treatment plan.     - Routine laboratory workup ordered and will be reviewed.      - Medical/substance/nutrition Consult will be placed for Dr. Betti Cruzeddy and his team.     -The previous medical record will be reviewed and collateral information   obtained from family, group home and case management if it becomes available.     -The patient will be encouraged to participate in the therapeutic milieu in order to evaluate behavioral and treatment response.     - We are going to refer the patient for individual counseling with our [facility].     - Will continue to monitor for suicidal or homicidal thoughts as well as monitor for evolution of psychiatric process including development of psychosis.

## 2016-04-07 NOTE — Consults
Methadone Screen, Urine Not Detected Not Detected    Opiate Screen, Urine Not Detected Not Detected    OXYCODONE SCREEN URINE Not Detected Not Detected    Cannabinoid Scrn, Ur Not Detected Not Detected             Assessment and Plan:    Linton FlemingsOscar Forman is a 50 y.o. male with no / following medical problems;    Uncontrolled DM; will resume lantus and novolog with meals.    Mild anemia; stable

## 2016-04-07 NOTE — Psych H&P
Depression:  Endorses depressed mood, decreased sleep, decreased concentration, decreased interests, low energy, change of appetite, feelings of guilt, helplessness, or hopelessness.     Mania:  Denies recent manic symptoms or negative consequences of past mania, including increased goal-directed or high risk activity, impulsivity, grandiosity, distractability, irritability, decreased need for sleep, elevated mood, increased rate of speech, and racing thoughts.    Anxiety:  Denies significant anxiety, including excess worrying, restlessness or feeling edgy, increased muscle tension, decreased sleep, and decreased concentration related to anxiety.     PTSD:  Denies major stress/trauma and symptoms related to trauma, including recurrent disturbing dreams, avoidance of troubling memories, amnesia for key events of trauma, unwanted images/flashbacks, markedly diminished interest, emotional numbing, hypervigilance, and exaggerated startle response.     OCD:   Denies obsessive thoughts and compulsions, including repetitive washing/cleaning, counting/checking, or organizing/praying behaviors and intrusive or persistent thoughts that are recognized as excessive or irrational.    Panic Disorder: Denies symptoms of panic attacks, including acute onset of fear and anxiety accompanied by trembling, palpitations, nausea/chills, choking/chest pain, sweating, the fear of dying or going crazy, anticipatory anxiety, avoidance, and agoraphobia.    Psychosis:  Endorses psychotic symptoms including auditory hallucinations, visual hallucinations, tactile hallucinations, illusions, and delusional beliefs.    Memory:  Denies short or long term memory problems, word-finding difficulties, problems remembering friends/family.      ADHD: Patient denied symptoms of inattention and hyperactivity.     Eating Patterns:  Patient denies purposeful restricting of energy intake

## 2016-04-07 NOTE — Plan of Care
Problem: Risk for Self Harm  Goal: Will not injure self  Short term   Outcome: Ongoing  Patient has not exhibited any self injurious behaviors this evening however reports severe depression and wanted to be left alone. Patient was irritable and redirected writer to his medical record for the answer to questions being asked.

## 2016-04-07 NOTE — Psych H&P
relative to requirements, and binging/purging to obtain desired appearance which results in clinically significant distress or impairment in functioning.       Past Psychiatric History:       The patient?s previous psychiatric diagnoses include  none    The patient has had no previous psychiatric hospitalizations.  OR  The patient has previously been hospitalized none    The patient is followed by none as an outpatient.      Prior psychiatric medications: No    Current psychiatric medication: No  Please see PTA list.      Suicide Risk Assessment:  The patient Denies a history of suicide attempts.  Prior aborted/interrupted suicide attempts: No  Prior intentional self-injury without suicide intent: No  Accessibility of suicide methods (ex firearms): No  Motivations for suicide (stressors): Yes, homeless, and continuous AH  Protective factors: No      Violence Assessment:  History of assault/violent behavior: No    Medical Records available :  Record Review: brief.    Psychosocial History:  Current living situation: homeless   [place] was [place] in [place] and raised in [place].  Parents are divorced.  Pt has 0 siblings.  Pt has been married 0 times and has 0 kids.    Pt worked as none and receives none for finances.  Pt has no legal issues.  Pt denies abuse: none  Social supports: none  Education: would not answer, but states he reads/writes on 3rd grade level.    Spiritual/religious affiliation: no.    Medical History:   Head trauma: no   Seizure history: no   Hypertension: No   Diabetes: Yes   Coronary artery disease: No   CVA: no   Past Medical History:   Diagnosis Date   ? Alcohol abuse    ? Diabetes mellitus    ? Schizophrenia      History reviewed. No pertinent surgical history.    Pain:  Complaints of Pain: none    Physical and psychiatric ROS:   Neurological:negative  Cardiovascular: negative  Respiratory: negative  GI: negative  Musculoskeletal: negative

## 2016-04-07 NOTE — Psych H&P
-   We will initiate safety planning and social services regarding aftercare and disposition options. Firearms and other dangerous weapons are encouraged to be removed from the living environment.     - Discharge to occur pending psychiatric stabilization of symptoms.     Monitor for increasing psychosis  Monitor for increasing depression  Monitor vital signs for clinical correlation  Maintain compliance  Check lab studies    Rosita FireAndrew Manhan    Scribe Attestation: I, Rosita FireAndrew Manhan, have acted as a Neurosurgeonscribe for Mardella LaymanArora, Pradeep MD 11:51 AM 04/07/2016     Physician Attestation: I have reviewed and confirmed the information stated by the scribe and made corrections and edits as appropriate.  I have personally provided the services documented by the scribe.

## 2016-04-07 NOTE — Psych H&P
Remainder of the pertinent ROS reviewed with Patient:  negative    BMI: Body mass index is 22.76 kg/(m^2).     Allergies: No Known Allergies    Prescriptions Prior to Admission   ? Order #: [id number] Class: Historical Med           Drug and Alcohol History:   The patient Denies alcohol use, drinking 0 per week.     The patient Endorses any current or past use of illicit drugs.     The patient Denies any history of legal problems related to drugs or alcohol and Denies any history of drug or alcohol detox or rehab treatment.   Previous treatment: none  The patient Denies any current or past use of tobacco.      Current Outpatient substance treatment: No         Family  History:   Psychiatric: none  Suicide: none  Substance Abuse: none  Physical Problems: none    Mental Status Examination:  Appearance:Melvin Ellis is a 50 y.o.  African-American male  hospital attire.    Behavior/Eye Contact. poor.  Grooming: unkempt  Hygiene: poor  Muscle strength and tone: normal, without apparent weakness or atrophy. No evidence of abnormal movements, extrapyramidal symptoms or tardive dyskinesia.  Gait: ambulates without difficulty  Speech: normal; appropriate.  Mood: dysphoric and irritable  Affect: mood-congruent.  Thought Process: linear.  Thought content: admits to suicidal ideation AH  Associations: appropriate.  Suicide Ideation: Yes, with plan to walk into traffic  Homicide Ideation: No  Perception: auditory, command-type "telling him negative things"  Insight: poor  Judgment: poor  Orientation: oriented to person, place, time/date, situation, day of week, month, year.  Insight/Judgement: impaired insight  Memory: remote and recent memory grossly intact  Attention/Concentration: alert  Fund of knowledge: appropriate for level of education  Language: no language difficulty reported.  Strengths: Ability to articulate needs verbally  Limitations: Suboptimal adaptive coping strategies    Interpretive Summary:

## 2016-04-07 NOTE — Psych Nurses Notes
Pt signed voluntary and med consents.

## 2016-04-07 NOTE — Consults
Department of Medicine  IM Consult H&P for Psychiatry     Admission Date and Time: 04/06/2016  5:56 AM   Primary Care Physician: Patient, None Per   CC and HPI:   Melvin Ellis is a 50 y.o. male who is being admitted for the evaluation and management of psychiatric illness.  Medicine consultation has been requested for the management of any medical problems.  H/o insulin requiring DM and alcoholism;  Patient has no complaints of SOB, dysuria, fever or productive cough.              Past Medical History:   Diagnosis Date   ? Alcohol abuse    ? Diabetes mellitus    ? Schizophrenia      History reviewed. No pertinent surgical history.  No family history on file.  Social History     Social History   ? Marital status: Single     Spouse name: N/A   ? Number of children: N/A   ? Years of education: N/A     Social History Main Topics   ? Smoking status: Current Every Day Smoker   ? Smokeless tobacco: Never Used   ? Alcohol use Yes   ? Drug use: Yes     Special: Cocaine   ? Sexual activity: Not Asked     Other Topics Concern   ? None     Social History Narrative       Home Medications:  Prescriptions Prior to Admission   Medication Sig   ? UNKNOWN TO PATIENT      No Known Allergies     Review of Systems:  Cardiovascular - No Chest pain, no palpitations  GI; No Melena  Gen; No unintentional weight loss  All other review of systems are negative.    Physical Exam:     Patient Vitals for the past 24 hrs:   BP Temp Temp src Pulse Resp SpO2 Height Weight   04/07/16 0705 110/67 36.9 ?C (98.5 ?F) Oral 89 15 - - -   04/06/16 1907 105/57 37 ?C (98.6 ?F) Oral 82 20 - - -   04/06/16 1525 116/71 37.3 ?C (99.1 ?F) Oral 80 16 - 1.676 m (5\' 6" ) 64 kg (141 lb)   04/06/16 1302 106/63 36.9 ?C (98.5 ?F) Oral 83 18 98 % - -   04/06/16 1132 114/74 - - 92 18 97 % - -   04/06/16 0938 111/66 37.7 ?C (99.8 ?F) Oral 101 18 99 % - -   04/06/16 0931 143/88 - - 86 17 97 % - -        Patient Vitals for the past 72 hrs:   Weight

## 2016-04-07 NOTE — Psych H&P
to ask any questions and was in agreement with the treatment plan.     - Routine laboratory workup ordered and will be reviewed.      - Medical/substance/nutrition Consult will be placed for Dr. Betti Cruzeddy and his team.     -The previous medical record will be reviewed and collateral information   obtained from family, group home and case management if it becomes available.     -The patient will be encouraged to participate in the therapeutic milieu in order to evaluate behavioral and treatment response.     - We are going to refer the patient for individual counseling with our [facility].     - Will continue to monitor for suicidal or homicidal thoughts as well as monitor for evolution of psychiatric process including development of psychosis.     - We will initiate safety planning and social services regarding aftercare and disposition options. Firearms and other dangerous weapons are encouraged to be removed from the living environment.     - Discharge to occur pending psychiatric stabilization of symptoms.     Monitor for increasing psychosis  Monitor for increasing depression  Monitor vital signs for clinical correlation  Maintain compliance  Check lab studies    [facility]    Scribe Attestation: Kaleen OdeaI, Andrew Manhan, have acted as a Neurosurgeonscribe for Mardella LaymanArora, Pradeep MD 11:51 AM 04/07/2016     Physician Attestation: Marland Kitchen***

## 2016-04-07 NOTE — Consults
Hematocrit 34.4 (L) 40.0 - 54.0 %    MCV 84.1 82.0 - 101.0 fl    MCH 26.9 (L) 27.0 - 34.0 pg    MCHC 32.0 31.0 - 36.0 g/dL    RDW 47.813.4 29.512.0 - 62.116.1 %    Platelet Count 351 140 - 440 thou/cu mm    MPV 10.5 9.5 - 11.5 fl    nRBC % 0.0 0.0 - 1.0 %    Absolute NRBC Count 0.00     Neutrophils % 58.3 34.0 - 73.0 %    Lymphocytes % 23.9 (L) 25.0 - 45.0 %    Monocytes % 15.0 (H) 2.0 - 6.0 %    Eosinophils % 1.8 1.0 - 4.0 %    Immature Granulocytes % 0.3 0.0 - 2.0 %    Neutrophils Absolute 4.42 1.80 - 8.70 x10E3/uL    Lymphocytes Absolute 1.81 x10E3/uL    Monocytes Absolute 1.14 x10E3/uL    Eosinophils Absolute 0.14 x10E3/uL    Basophil Absolute 0.05 x10E3/uL    Basophils % 0.7 0 - 1 %   POCT Glucose    Collection Time: 04/06/16  4:07 PM   Result Value Ref Range    Glucose (Meter) 143 (H) 60.0 - 99.0 mg/dL   Urinalysis W/Microscopy    Collection Time: 04/06/16  5:01 PM   Result Value Ref Range    Color -Ur Yellow Amber    Clarity, UA Clear Hazy    Specific Gravity, Urine 1.017 1.003 - 1.030    pH, Urine 6.0 4.5 - 8.0    Protein-UA Negative Negative mg/dL    Glucose -Ur 308.6150.0 (A) Negative mg/dL    Ketones UA Negative Negative mg/dL    Bilirubin -Ur Negative Negative    Blood -Ur Negative Negative    Nitrite -Ur Negative Negative    Urobilinogen -Ur Normal Normal    Leukocytes -Ur Negative Negative    RBC -Ur 2 0 - 5 /HPF    WBC -Ur <1 0 - 5 /HPF    Squam Epithel, UA <1 Not established /HPF    Bacteria -Ur None seen None seen /HPF    Mucus -Ur Rare 2+ /LPF    ASCORBIC ACID Negative 20 mg/dL   Urine Drugs of Abuse (DOA)    Collection Time: 04/06/16  5:01 PM   Result Value Ref Range    Amphetamine Screen, Urine Not Detected Not Detected    Barbiturate Screen, Urine Not Detected Not Detected    Benzodiazepine Screen, Urine Not Detected Not Detected    Cocaine Metabolites, Ur (A) Not Detected     Presumptive Postive. Positive results are unconfirmed and should be used for medical evaluation only.

## 2016-04-07 NOTE — Psych H&P
[facility]  Inpatient Psychiatry  [place]      HISTORY & PHYSICAL/INITIAL PSYCHIATRIC EVALUATION      MRN: [id number]  Patient Name: Melvin Ellis       D.O.B.: 02/03/1966  Date of admission: 04/06/2016  Date of Service: 04/07/2016    Identifying Data: Melvin Ellis is a 50 y.o. y.o. male who is Single [1] and is currentlyNot Employed [3]. The patient is here under a Engineer, productionBaker Act dated 04/06/2016 that says "pt endorsing suicidal ideations with plan to walk out into oncoming traffic." Primary care physician is Patient, None Per and his outpatient psychiatrist is none per pt.    Presenting Complaint: "I'm having suicidal thoughts and I want to get help."      History of Present Illness:   Melvin Ellis is a 50 y.o. male was seen while in his room and presents agitated. He is here under a Engineer, productionBaker Act dated 04/06/2016 that says "pt endorsing suicidal ideations with plan to walk out into oncoming traffic." When asked why he was here he states "I'm having suicidal thoughts and I want to get help." At this time pt admits to both SI with plan to walk into traffic and AH. He states that he has been hearing voices for years now. Pt also discusses that "he is tired of being in and out of shelters." He admits to cocaine use, but states "that's not important, I want these voices to go away." Pt exhibited irritability and was dysphoric at this time. Pt states that he has no family and hasn't been able to work. PMHx includes DM and states that "he needs insulin to help." Pt also discusses that he "has a learning disability and can only read/write on a 3rd grade level." Pt reports poor sleep, good appetite, and good energy at this time.        sleep: no  appetite changes: no  weight changes: no  energy/anergy: no  interest/pleasure/anhedonia: no  somatic symptoms: no  libido: no  anxiety/panic: no  guilty/hopeless: no  S.I.B.s/risky behavior: yes  any drugs: yes  alcohol: no

## 2016-04-08 MED ORDER — RISPERIDONE 1 MG PO TABS
1 mg | Freq: Every evening | ORAL | Status: DC
Start: 2016-04-08 — End: 2016-04-11

## 2016-04-08 NOTE — Progress Notes
[facility]  Inpatient Psychiatry        Admit Date: 04/06/2016     LOS: 2 days       MRN: [id number]   Patient : Melvin Ellis   D.O.B :  10/29/1965    Date of Service: 04/08/2016     Subjective/objective:  [name] 50 y.o. male was seen. Patient was agitated. [name]'s appetite has improved. Patient stated that their energy level  has improved. Patient stated that their sleep level has improved.  [name]'s speech was normal spontaneous; appropriate. Patient denies any psychotic process (including paranoia,  or delusions), SI or HI. Does report " hearing voices in his head" . Worried about housing.   The patient's attention was adequate, and their cognition was Within functional limits:  problem-solving/reasoning     Compliant with medication:yes  Behavioral disturbance: yes  Emergency treatment order required: no  Seclusion/restraints required: no  Attending therapy groups: yes    Pain rating: Pain Rating: 0 /10.   Current Medication List:  Scheduled:   ? insulin aspart  5 Units Subcutaneous TID AC   ? insulin glargine  15 Units Subcutaneous Daily before lunch   ? sertraline  50 mg Oral daily       PRN:   acetaminophen 650 mg Oral Q6H PRN   aluminum-magnesium-simethicone hydroxide 30 mL Oral Q4H PRN   benztropine 1 mg Oral Q6H PRN   Or      benztropine mesylate 1 mg Intramuscular Q6H PRN   dextrose 30 mL Intravenous PRN   diphenhydrAMINE 50 mg Oral Nightly PRN   glucose 16 g Oral PRN   haloperidol 2 mg Oral Q8H PRN   Or      haloperidol lactate 2 mg Intramuscular Q8H PRN   Or      haloperidol 2 mg Oral Q8H PRN   LORazepam 1 mg Oral Q4H PRN   Or      LORazepam 1 mg Intramuscular Q4H PRN   Or      LORazepam 1 mg Oral Q4H PRN        Vital Signs: Last Filed Vitals Signs: 24 Hour Range   BP: 115/60 (09/04 0703) BP: (104-115)/(58-60)    Temp: 36.9 ?C (98.4 ?F) (09/04 0703) Temp:  [36.9 ?C (98.4 ?F)-37.1 ?C (98.8 ?F)]    Pulse: 84 (09/04 0703) Pulse:  [84]    Resp: 16 (09/04 0703) Resp:  [16]

## 2016-04-08 NOTE — Plan of Care
Problem: Risk for Self Harm  Goal: Will not injure self  Short term   Outcome: Ongoing  Patient visible in dayroom. He reports feeling suicidal but does not have a plan. He refused antidepressant medications this morning and wouldn't elaborate on his refusal.

## 2016-04-08 NOTE — Progress Notes
Respiratory Device: Room Air / (None)               Results for orders placed or performed during the hospital encounter of 04/06/16 (from the past 24 hour(s))   POCT Glucose    Collection Time: 04/07/16  4:05 PM   Result Value    Glucose (Meter) 248 (H)   POCT Glucose    Collection Time: 04/07/16  9:07 PM   Result Value    Glucose (Meter) 244 (H)   POCT Glucose    Collection Time: 04/08/16  6:27 AM   Result Value    Glucose (Meter) 220 (H)   POCT Glucose    Collection Time: 04/08/16 11:23 AM   Result Value    Glucose (Meter) 139 (H)       Body mass index is 23.57 kg/(m^2).      Diagnoses:    Patient Active Problem List   Diagnosis   ? Ingestion of substance         Plan:    Will add Low dose of Risperdal at night for AH.   Patient is still fragile as evidenced by the assessment of mental status and support system. Patient's psychosis, depression, is uncontrolled and the patient lacks adequate judgement to maintain physical safety and inadequate problem-solving skills, posing risk of immediate decompensation if discharged.    I certify that this patient continues to need, on a daily basis, active treatment furnished directly by or requiring the supervision of inpatient psychiatric facility personnel.

## 2016-04-08 NOTE — Plan of Care
Problem: Risk for Self Harm  Goal: Will not injure self  Short term   Outcome: Ongoing  Pt sitting in dayroom. At this time pt denies any SI,HI or AVH. Pt complaint with scheduled medications. No scheduled medications at this time. Q15 monitoring continued.

## 2016-04-09 MED ORDER — INSULIN GLARGINE 100 UNIT/ML SC SOLN
18 [IU] | Freq: Every day | SUBCUTANEOUS | Status: DC
Start: 2016-04-09 — End: 2016-04-11

## 2016-04-09 NOTE — Plan of Care
Problem: Risk for Self Harm  Goal: Denies intent to harm self  Short term   Outcome: Ongoing  Patient visible in dayroom. He denies having any current suicidal or homicidal thoughts. He took scheduled medications as ordered this shift.

## 2016-04-09 NOTE — Plan of Care
Problem: Risk for Self Harm  Goal: Will not injure self  Short term   Outcome: Ongoing  Pt sitting in dayroom. At this time pt denies any SI or HI. [facility] but would not elaborate. Pt compliant with scheduled medications. Q15 monitoring continued.

## 2016-04-09 NOTE — Progress Notes
Department of Case Management  Discharge Planning     Patient ID:   NAME:  Loranzo Desha  MRN: [id number]    AGE: 50 y.o.   DOB: [birth year]  Date of Admission: 04/06/2016      Address: [address]  [place] [address]     Emergency Contact:   Payor: Payor: PENDING SSI / Plan: PENDING SSI INFORMATION ADREIMA / Product Type: Pending /     Medicare Important Message Provided:    Patient admitted with   Patient Active Problem List   Diagnosis   ? Ingestion of substance       Patient has Capacity for Decision-Making: yes   Prior Living Arrangements / Resides with: homeless   Patient Capacity for Self-Care / Level of Independence: independent   Caregiver / Support System: limited   Patient provided contact person:    Name: Contact Name: "no one"  Relationship:    Phone #:    Advance Directive: none   Health Care Proxy: no  Therapy Recommendations:  [place], Durable Medical Equipment:   Community Resources:   Outpatient Case Manager:  Primary Care Physician: Patient, None Per   Pharmacy: No Pharmacies Listed  Social History:   Social History     Social History   ? Marital status: Single     Spouse name: N/A   ? Number of children: N/A   ? Years of education: N/A     Occupational History   ? Not on file.     Social History Main Topics   ? Smoking status: Current Every Day Smoker   ? Smokeless tobacco: Never Used   ? Alcohol use Yes   ? Drug use: Yes     Special: Cocaine   ? Sexual activity: Not on file     Other Topics Concern   ? Not on file     Social History Narrative     Assessment and Discharge Plan:  Additional Information: Met with patient and. Introduced self and explained CM role in safe discharge planning. Discharge planning assessment completed.  Versie Fleener is a 50 y.o. male was seen while in his room and presents agitated. He is here under a Child psychotherapist Act dated 04/06/2016 that says "pt endorsing suicidal ideations with plan to walk out into oncoming traffic."

## 2016-04-09 NOTE — Progress Notes
When asked why he was here he states "I'm having suicidal thoughts and I want to get help." At this time pt admits to both SI with plan to walk into traffic and AH. He states that he has been hearing voices for years now. Pt also discusses that "he is tired of being in and out of shelters." He admits to cocaine use, but states "that's not important, I want these voices to go away." Pt exhibited irritability and was dysphoric at this time. Pt states that he has no family and hasn't been able to work. PMHx includes DM and states that "he needs insulin to help." Pt also discusses that he "has a learning disability and can only read/write on a 3rd grade level." Pt reports poor sleep, good appetite, and good energy at this time.    Writer met with patient at bedside. Patient reports that he is homeless and he is wanting to be admitted to [facility]. Patient states that he does not receive any income and he does not have a Teacher, music. Patient is familiar with the shelters in the area. Patient will require a bus pass when discharged. There are no barriers to discharge.      Discharge Plan: Shelter  Discharge Pending: stabilization   Expected Discharge Date:    Transportation/Contact Person at Discharge: bus   Novella Olive   04/09/2016 9:52 AM

## 2016-04-10 NOTE — Plan of Care
Problem: Impaired Social Skills  Goal: Demonstrates improved social interactions  Short term goal: Attend RT group  Long term goal: Actively participate while attending RT group   Outcome: Ongoing  Patient is visible in dayroom, isolative to self, constricted affect. Nods head when asked questions. Minimal eye contact. Denies current S/I. Medication compliant.

## 2016-04-10 NOTE — Psych Progress Note
Patient briefly attends RT group, but keeps to self while in the dayroom most of the morning.

## 2016-04-10 NOTE — Plan of Care
Problem: Risk for Self Harm  Goal: Will not injure self  Short term   Intervention: Assess risk for self harm  Currently, Aox4. Denies SI/HI or A/VH. Mood is irritable, aphathetic. Hypoverbal, gives one word answers. Guarded but cooperative. Isolative to room, does not interact with peers at all.

## 2016-04-10 NOTE — Psych Progress Note
Patient refused RT group.

## 2016-04-11 DIAGNOSIS — F1010 Alcohol abuse, uncomplicated: Secondary | ICD-10-CM

## 2016-04-11 DIAGNOSIS — F209 Schizophrenia, unspecified: Secondary | ICD-10-CM

## 2016-04-11 DIAGNOSIS — F29 Unspecified psychosis not due to a substance or known physiological condition: Secondary | ICD-10-CM

## 2016-04-11 DIAGNOSIS — F1420 Cocaine dependence, uncomplicated: Secondary | ICD-10-CM

## 2016-04-11 DIAGNOSIS — F1020 Alcohol dependence, uncomplicated: Secondary | ICD-10-CM

## 2016-04-11 DIAGNOSIS — D649 Anemia, unspecified: Secondary | ICD-10-CM

## 2016-04-11 DIAGNOSIS — E119 Type 2 diabetes mellitus without complications: Principal | ICD-10-CM

## 2016-04-11 DIAGNOSIS — F329 Major depressive disorder, single episode, unspecified: Secondary | ICD-10-CM

## 2016-04-11 MED ORDER — LEVEMIR FLEXTOUCH 100 UNIT/ML SC SOPN
0 refills | Status: SS
Start: 2016-04-11 — End: 2016-04-11

## 2016-04-11 MED ORDER — LEVEMIR FLEXTOUCH 100 UNIT/ML SC SOPN
18 [IU] | Freq: Every morning | SUBCUTANEOUS | 2 refills | Status: CP
Start: 2016-04-11 — End: ?

## 2016-04-11 MED ORDER — VITAMIN B-1 100 MG PO TABS
0 refills
Start: 2016-04-11 — End: ?

## 2016-04-11 MED ORDER — FOLIC ACID 1 MG PO TABS: Start: 2016-04-11 — End: ?

## 2016-04-11 MED ORDER — SERTRALINE HCL 50 MG PO TABS
50 mg | Freq: Every day | ORAL | 1 refills | Status: CP
Start: 2016-04-11 — End: ?

## 2016-04-11 MED ORDER — HUMALOG KWIKPEN 100 UNIT/ML SC SOPN
0 refills | Status: SS
Start: 2016-04-11 — End: 2016-04-11

## 2016-04-11 MED ORDER — ULTICARE SHORT PEN NEEDLES 31G X 8 MM MISC
0 refills
Start: 2016-04-11 — End: ?

## 2016-04-11 MED ORDER — RISPERIDONE 1 MG PO TABS
1 mg | Freq: Every evening | ORAL | 1 refills | Status: CP
Start: 2016-04-11 — End: ?

## 2016-04-11 MED ORDER — HUMALOG KWIKPEN 100 UNIT/ML SC SOPN
5 [IU] | Freq: Three times a day (TID) | SUBCUTANEOUS | 3 refills | Status: CP
Start: 2016-04-11 — End: ?

## 2016-04-11 MED ORDER — PERPHENAZINE 4 MG PO TABS
0 refills | Status: SS
Start: 2016-04-11 — End: 2016-04-11

## 2016-04-11 NOTE — Psych Nurses Notes
Discharge note: Melvin FavreOscar left unit @ 0945  Ambulatory escorted by staff. Pt given all belongings & valuables returned from security. Verbalized understanding of all discharge instructions. Given bus pass for transportation & paper prescriptions.

## 2016-04-11 NOTE — Progress Notes
Discharge: Patient has orders to be discharged today. Patient will discharge to shelter of choice. A bus pass will be provided. Patient is agreeable with the discharge plan.

## 2016-04-11 NOTE — Plan of Care
Problem: Risk for Self Harm  Goal: Will not injure self  Short term   Outcome: Ongoing  Pt sitting in dayroom. At this time pt denies any SI or HI. Pt compliant with scheduled medications. Q15 monitoring continued.

## 2016-05-02 NOTE — Psych DC Summary
[facility]  [facility]iat[facility]  [place]      DISCHARGE SUMMARY    MRN: [id number]  Patient Name: Melvin Ellis   Date of birth: 08/06/1965  Date of Admission: 04/06/2016  Date of Discharge:  04/10/2016  Admitting Physician: Sherlynn Carbonaul Soto-Acosta, MD   Discharge Physician: Sherlynn Carbonaul Soto-Acosta, MD      Admission Condition: poor    Discharged Condition: fair  Pt was discharged in a improved condition.    Indication for Admission: suicide attempt/gesture/ideation.    Reason for Admission: Melvin FlemingsOscar Ratchford is a 50 y.o. male was seen while in his room and presents agitated. He is here under a Engineer, productionBaker Act dated 04/06/2016 that says "pt endorsing suicidal ideations with plan to walk out into oncoming traffic." When asked why he was here he states "I'm having suicidal thoughts and I want to get help." At this time pt admits to both SI with plan to walk into traffic and AH. He states that he has been hearing voices for years now. Pt also discusses that "he is tired of being in and out of shelters." He admits to cocaine use, but states "that's not important, I want these voices to go away." Pt exhibited irritability and was dysphoric at this time. Pt states that he has no family and hasn't been able to work. PMHx includes DM and states that "he needs insulin to help." Pt also discusses that he "has a learning disability and can only read/write on a 3rd grade level." Pt reports poor sleep, good appetite, and good energy at this time.  ?      Admission Diagnoses: Schizophrenia [F20.9]  Diabetes mellitus type 2 in nonobese [E11.9]  Tobacco use [Z72.0]  Schizophrenia, unspecified type [F20.9]  Ingestion of substance, undetermined intent, initial encounter [T65.94XA]    No prescriptions prior to admission.           Inpatient Course of Stay:    Treatment Rendered: Medication Management  Psychotherapy  Recreational Therapy  Social Service/DC Planning

## 2016-05-02 NOTE — Psych DC Summary
Melvin Soto-Acosta, MD

## 2016-05-02 NOTE — Discharge Summary
Hospital Course:  Melvin Ellis is a 50 y.o. male who was admitted to [facility] involuntarily . Please see H&P for further information about the patient's initial presentation. he was started on medications. . Relevant labs were reviewed, and are below.. Pt tolerated all medication changes and showed adequate improvement. he began attending groups and interacted well with others in the ward milieu. Pt began showing improved insight into condition  and the patient was monitored without incident. Pt denies , in a very convincing manner, suicidal or homicidal ideas. Pt reached maximum therapeutic in-patient benefit and the decision was made for discharge.     Initial physical examination was done by Dr. Reece Levy or his associate    Consults:   Consults   Procedures   ? Inpatient consult to Int Med for Psych       Significant Diagnostic Studies:   Labs include:   Metabolic Lab Profile:  Lab Results   Component Value Date/Time    NA 143 04/06/2016 01:59 PM    K 3.9 04/06/2016 01:59 PM    CL 102 04/06/2016 01:59 PM    CO2 28 04/06/2016 01:59 PM    BUN 11 04/06/2016 01:59 PM    CREATININE 1.03 04/06/2016 01:59 PM    GLU 163 (H) 04/06/2016 01:59 PM    CALCIUM 10.1 (H) 04/06/2016 01:59 PM    TPROT 7.0 04/06/2016 01:59 PM    ALB 3.7 (L) 04/06/2016 01:59 PM    AST 11 (L) 04/06/2016 01:59 PM    ALT 21 04/06/2016 01:59 PM    EGFR >59 04/06/2016 01:59 PM    TBILI 0.1 (L) 04/06/2016 01:59 PM    ALKPHOS 64 04/06/2016 01:59 PM       CBC (with or without Differential): Lab Results   Component Value Date    WBC 7.58 04/06/2016    HGB 11.0 (L) 04/06/2016    HCT 34.4 (L) 04/06/2016    MCV 84.1 04/06/2016    MCH 26.9 (L) 04/06/2016    MCHC 32.0 04/06/2016    RDW 13.4 04/06/2016    PLATCOUNT 351 04/06/2016    MPV 10.5 04/06/2016    NEUTROPCT 58.3 04/06/2016    MONOPCT 15.0 (H) 04/06/2016    EOSPCT 1.8 04/06/2016    BASOPCT 0.7 04/06/2016       Fasting Lipid Profile:  No results found for: CHOL, HDL, LDL, TRIG, CHOLHDL

## 2016-05-02 NOTE — Psych DC Summary
[facility]  Inpatient [facility]iatry  [place]      DISCHARGE SUMMARY    MRN: [id number]  Patient Name: Melvin Ellis   Date of birth: 08/14/1965  Date of Admission: 04/06/2016  Date of Discharge:  04/10/2016  Admitting Physician: Sherlynn Carbonaul Soto-Acosta, MD   Discharge Physician: Sherlynn Carbonaul Soto-Acosta, MD      Admission Condition: poor    Discharged Condition: fair  Pt was discharged in a improved condition.    Indication for Admission: suicide attempt/gesture/ideation.    Reason for Admission: Melvin FlemingsOscar Gail is a 50 y.o. male was seen while in his room and presents agitated. He is here under a Engineer, productionBaker Act dated 04/06/2016 that says "pt endorsing suicidal ideations with plan to walk out into oncoming traffic." When asked why he was here he states "I'm having suicidal thoughts and I want to get help." At this time pt admits to both SI with plan to walk into traffic and AH. He states that he has been hearing voices for years now. Pt also discusses that "he is tired of being in and out of shelters." He admits to cocaine use, but states "that's not important, I want these voices to go away." Pt exhibited irritability and was dysphoric at this time. Pt states that he has no family and hasn't been able to work. PMHx includes DM and states that "he needs insulin to help." Pt also discusses that he "has a learning disability and can only read/write on a 3rd grade level." Pt reports poor sleep, good appetite, and good energy at this time.  ?      Admission Diagnoses: Schizophrenia [F20.9]  Diabetes mellitus type 2 in nonobese [E11.9]  Tobacco use [Z72.0]  Schizophrenia, unspecified type [F20.9]  Ingestion of substance, undetermined intent, initial encounter [T65.94XA]    Prescriptions Prior to Admission   ? Order #: [id number] Class: Historical Med           Inpatient Course of Stay:    Treatment Rendered: Medication Management  Psychotherapy  Recreational Therapy  Social Service/DC Planning

## 2016-05-02 NOTE — Psych DC Summary
Hemoglobin A1c:  No results found for: HGBA1C    Height: Height: 167.6 cm (5\' 6" )  Weight: Weight: 66.2 kg (146 lb)  BMI: BMI (Calculated): 22.81   PAIN :  0 /10    BP Readings from Last 3 Encounters:   04/11/16 104/55   04/03/16 124/74       Other Relevant Labs Include:    Lab Results   Component Value Date    TSH 0.273 04/06/2016           Restraints use: None    The patient during this hospitalization received a combination of individual, group, and milieu therapy, as well as psychopharmacological treatment. Response to treatment has been good.        Mental Status Examination upon Discharge:  -Appearance: Melvin Ellis is a 50 y.o. yo African-American male in hospital attire.    -Behavior/Eye Contact. cooperative.  -Muscle Strength & Tone/Motor:  normal, without apparent weakness or atrophy. No evidence of abnormal movements, extrapyramidal symptoms or tardive dyskinesia.  -Gait: ambulates without difficulty  -Speech: normal; talkative; appropriate.  -Thought Process: coherent.  -Thought Content: denies any psychotic process (including paranoia, hallucinations or delusions), SI or HI.  -Mood: "  Good.".  -Affect: normal.  -Cognition: oriented to person, place, date, and time.  -Attention/Concentration: alert.  -Memory: remote and recent memory grossly intact.  -Language: no language difficulty reported and speaks fluent English.  -Fund of knowledge: appropriate for level of education.  -Insight/Judgement: normal insight and judgment    At this moment in time Melvin Ellis is not exhibiting any behaviour suggestive of any acute danger to self or others, nor an acute psychotic process. The patient is deemed to be stable enough to be treated on an outpatient basis. The precedent assessment is based on the available information at the time of this discharge.    Final Diagnoses:    Depressive disorder       Diabetes mellitus type 2, insulin dependent       Cocaine use disorder, severe, dependence

## 2016-05-02 NOTE — Psych Progress Note
(  98.8 ?F)]    Pulse: 79 (09/05 0715) Pulse:  [79-97]    Resp: 17 (09/05 0715) Resp:  [16-17]          Respiratory Device: Room Air / (None)               Results for orders placed or performed during the hospital encounter of 04/06/16 (from the past 24 hour(s))   POCT Glucose    Collection Time: 04/08/16 11:23 AM   Result Value    Glucose (Meter) 139 (H)   POCT Glucose    Collection Time: 04/08/16  4:24 PM   Result Value    Glucose (Meter) 233 (H)   POCT Glucose    Collection Time: 04/08/16  8:21 PM   Result Value    Glucose (Meter) 133 (H)   POCT Glucose    Collection Time: 04/09/16  6:24 AM   Result Value    Glucose (Meter) 186 (H)       Body mass index is 23.57 kg/(m^2).      Diagnoses:    Patient Active Problem List   Diagnosis   ? Ingestion of substance         Plan:  Patient is still fragile as evidenced by the assessment of mental status and support system. Patient's ***psychosis, depression, is uncontrolled and the patient lacks adequate judgement to maintain physical safety and inadequate problem-solving skills, posing risk of immediate decompensation if discharged.     { :34992::"***","I certify that this patient continues to need, on a daily basis, active treatment furnished directly by or requiring the supervision of inpatient psychiatric facility personnel."}      Scribe Attestation: I, Nancee LiterJenna Pierce, have acted as a Neurosurgeonscribe for Dr. Denny PeonSoto-Acosta 10:36 AM 04/09/2016     Physician Attestation: Marland Kitchen***

## 2016-05-02 NOTE — Discharge Summary
Hemoglobin A1c:  No results found for: HGBA1C    Height: Height: 167.6 cm (5\' 6" )  Weight: Weight: 66.2 kg (146 lb)  BMI: BMI (Calculated): 22.81   PAIN :  0 /10    BP Readings from Last 3 Encounters:   04/11/16 104/55   04/03/16 124/74       Other Relevant Labs Include:    Lab Results   Component Value Date    TSH 0.273 04/06/2016           Restraints use: None    The patient during this hospitalization received a combination of individual, group, and milieu therapy, as well as psychopharmacological treatment. Response to treatment has been good.        Mental Status Examination upon Discharge:  -Appearance: Melvin Ellis is a 50 y.o. yo African-American male  hospital attire.    -Behavior/Eye Contact. cooperative.  -Muscle Strength & Tone/Motor:  normal, without apparent weakness or atrophy. No evidence of abnormal movements, extrapyramidal symptoms or tardive dyskinesia.  -Gait: ambulates without difficulty  -Speech: normal; talkative; appropriate.  -Thought Process: coherent.  -Thought Content: denies any psychotic process (including paranoia, hallucinations or delusions), SI or HI.  -Mood: "  Good.".  -Affect: normal.  -Cognition: oriented to person, place, date, and time.  -Attention/Concentration: alert.  -Memory: remote and recent memory grossly intact.  -Language: no language difficulty reported and speaks fluent English.  -Fund of knowledge: appropriate for level of education.  -Insight/Judgement: normal insight and judgment    At this moment in time Melvin FlemingsOscar Ellis is not exhibiting any behaviour suggestive of any acute danger to self or others, nor an acute psychotic process. The patient is deemed to be stable enough to be treated on an outpatient basis. The precedent assessment is based on the available information at the time of this discharge.    Final Diagnoses:  Cocaine use disorder, severe, dependence   Alcohol use disorder, moderate, dependence

## 2016-05-02 NOTE — Psych DC Summary
Alcohol use disorder, moderate, dependence       Ingestion of substance Resolved       Schizophrenia spectrum disorder with psychotic disorder type not yet determined         Prognosis: fair     Medications Upon Discharge:     Discharge Medications      Active Meds       Sig    risperiDONE 1 MG Tabs   Commonly known as:  RisperDAL    1 mg, Oral, Nightly   Quantity:  30 tablet       sertraline 50 MG Tabs   Commonly known as:  ZOLOFT    50 mg, Oral, daily   Quantity:  30 tablet         Modified Prior to Admission Meds       Sig    HUMALOG KWIKPEN 100 UNIT/ML Sopn   Generic drug:  insulin lispro   What changed:    - how much to take  - how to take this  - when to take this    5 Units, Subcutaneous, TID AC   Quantity:  1 pen       LEVEMIR FlexTouch 100 UNIT/ML Sopn   Generic drug:  insulin detemir   What changed:    - how much to take  - how to take this  - when to take this    18 Units, Subcutaneous, QAM   Quantity:  1 pen         Continued Prior to Admission Meds       Sig    folic acid 1 MG Tabs    No dose, route, or frequency recorded.       thiamine 100 MG Tabs   Commonly known as:  VITAMIN B-1    No dose, route, or frequency recorded.       ULTICARE SHORT PEN NEEDLES 31G X 8 MM   Generic drug:  Insulin Pen Needle    No dose, route, or frequency recorded.         Stopped Meds          perphenazine 4 MG Tabs   Commonly known as:  TRILAFON       UNKNOWN TO PATIENT               Discharge Orders  Diet carb consistent     For patients with heart or kidney problems: Measure weight daily   If I have heart or kidney problems, I will record my daily weight and call my physician to report a gain of 3 lbs or more in 5 days.       Activity as tolerated     Symptoms to monitor after discharge   For any questions or concerns and to report any of the following symptoms/problems: Worsening of current symptoms, Chest pain, Shortness of breath, Dizziness, Swelling in ankles or legs, Pain uncontrolled by

## 2016-05-02 NOTE — Psych DC Summary
medication, Drainage or foul odor from incision, Extensive bruising or discoloration, Nausea/vomiting, Elevated temperature. Notify your physician.     Smoking cessation   If you smoke, stop.  If you don't smoke, do not start.  Avoid second hand smoke.  For help call the toll-free QuitLine at (231)197-59501-(705) [phone].  Follow up with Primary care Physician within seven days    Contact National Alliance on Mental Illness (www. nami.org)  [place] Chapter is  www.[place].nami.org   Phone 3473096299(904) 501-191-4059    Call 911 if you have active thoughts of harming yourself or others, if you have made a suicide attempt or if you experience severe medical problems  Call your OUT patient doctor or therapist if you experience worsening of your symptoms.    SUICIDE Prevention Hotline is 1-800-273- TALK (8255)    Advise Total Sobriety, to call for help call 211 or [phone]) [phone](904) [phone] (FIRST CALL)  Advise Smoking Cessation     WARFARIN (Coumadin) Education is NOT Required     The orders/medications listed here were reviewed, ordered and electronically signed by:   Sherlynn Carbonaul Soto-Acosta, MD          Recommendations:  Melvin Ellis is going to be discharged to the community and he is [facility] be [facility] follow[facility]. The patient was given information and about the [employer] [facility] and he had been made aware of their availability of the emergency evaluation services 24 hours a day, 7 days a week , as well as 911. The patient has been advised to follow up with the Primary Care Physician for any physical concerns.Discontinue alcohol use. Total sobriety was also advised.          Scribe Attestation: I, [name], have acted as a Neurosurgeonscribe for Dr. Denny PeonSoto-Acosta 12:36 PM 04/10/2016     Physician Attestation: I have reviewed and confirmed the information stated by the scribe and made corrections and edits as appropriate.  I have personally provided the services documented by the scribe.

## 2016-05-02 NOTE — Discharge Summary
physician.     Smoking cessation   If you smoke, stop.  If you don't smoke, do not start.  Avoid second hand smoke.  For help call the toll-free QuitLine at (404)711-91711-707-403-5159.  Follow up with Primary care Physician within seven days    Contact National Alliance on Mental Illness (www. nami.org)  [place] Chapter is  www.[place].nami.org   Phone [phone](904) 7814462737    Call 911 if you have active thoughts of harming yourself or others, if you have made a suicide attempt or if you experience severe medical problems  Call your OUT patient doctor or therapist if you experience worsening of your symptoms.    SUICIDE Prevention Hotline is 1-800-273- TALK (8255)    Advise Total Sobriety, to call for help call 211 or [phone](904) [phone] (FIRST CALL)  Advise Smoking Cessation     WARFARIN (Coumadin) Education is NOT Required     The orders/medications listed here were reviewed, ordered and electronically signed by:   Sherlynn Carbonaul Soto-Acosta, MD          Recommendations:  Melvin Ellis is going to be discharged to *** and he is going to be referred to *** for outpatient psychiatric followup. The patient was given information and about the [facility] [employer] and he had been made aware of their availability of the emergency evaluation services 24 hours a day, 7 days a week , as well as 911. The patient has been advised to follow up with the Primary Care Physician for any physical concerns.Discontinue alcohol use and Discontinue tobacco use. Total sobriety was also advised.          Scribe Attestation: I, [name], have acted as a Neurosurgeonscribe for Dr. Denny PeonSoto-Acosta 10:55 AM 04/11/2016     Physician Attestation: Marland Kitchen***

## 2016-05-02 NOTE — Discharge Summary
Depressive disorder       Diabetes mellitus type 2, insulin dependent       Cocaine use disorder, severe, dependence       Alcohol use disorder, moderate, dependence       Ingestion of substance Resolved       Schizophrenia spectrum disorder with psychotic disorder type not yet determined       Prognosis: fair     Medications Upon Discharge:     Discharge Medications      Active Meds       Sig    risperiDONE 1 MG Tabs   Commonly known as:  RisperDAL    1 mg, Oral, Nightly   Quantity:  30 tablet       sertraline 50 MG Tabs   Commonly known as:  ZOLOFT    50 mg, Oral, daily   Quantity:  30 tablet         Modified Prior to Admission Meds       Sig    HUMALOG KWIKPEN 100 UNIT/ML Sopn   Generic drug:  insulin lispro   What changed:    - how much to take  - how to take this  - when to take this    5 Units, Subcutaneous, TID AC   Quantity:  1 pen       LEVEMIR FlexTouch 100 UNIT/ML Sopn   Generic drug:  insulin detemir   What changed:    - how much to take  - how to take this  - when to take this    18 Units, Subcutaneous, QAM   Quantity:  1 pen         Continued Prior to Admission Meds       Sig    folic acid 1 MG Tabs    No dose, route, or frequency recorded.       thiamine 100 MG Tabs   Commonly known as:  VITAMIN B-1    No dose, route, or frequency recorded.       ULTICARE SHORT PEN NEEDLES 31G X 8 MM   Generic drug:  Insulin Pen Needle    No dose, route, or frequency recorded.         Stopped Meds          perphenazine 4 MG Tabs   Commonly known as:  TRILAFON       UNKNOWN TO PATIENT               Discharge Orders  Diet carb consistent     For patients with heart or kidney problems: Measure weight daily   If I have heart or kidney problems, I will record my daily weight and call my physician to report a gain of 3 lbs or more in 5 days.       Activity as tolerated     Symptoms to monitor after discharge   For any questions or concerns and to report any of the following

## 2016-05-02 NOTE — Psych Progress Note
[facility]  Inpatient Psychiatry        Admit Date: 04/06/2016     LOS: 3 days       MRN: [id number]   Patient : Melvin Ellis   D.O.B :  01/19/1966    Date of Service: 04/09/2016     Subjective/objective:  Melvin Ellis 50 y.o. male was seen and stated that he would "like to be discharged to [facility] today if possible" to help with his ETOH addiction. Patient was cooperative and appropriate with good eye contact. [name]'s appetite has been stable. Patient stated that their energy level  has been stable. Patient stated that their sleep level has been stable.  [name]'s speech was normal talkative; appropriate. Patient denies any psychotic process (including paranoia, hallucinations or delusions), SI or HI. The patient's attention was adequate, and their cognition was within functional limits.    Compliant with medication:yes  Behavioral disturbance: no  Emergency treatment order required: no  Seclusion/restraints required: no  Attending therapy groups: yes    Pain rating: Pain Rating: 0 /10.     Current Medication List:  Scheduled:   ? insulin aspart  5 Units Subcutaneous TID AC   ? insulin glargine  18 Units Subcutaneous Daily before lunch   ? risperiDONE  1 mg Oral Nightly   ? sertraline  50 mg Oral daily       PRN:   aluminum-magnesium-simethicone hydroxide 30 mL Oral Q4H PRN   benztropine 1 mg Oral Q6H PRN   Or      benztropine mesylate 1 mg Intramuscular Q6H PRN   dextrose 30 mL Intravenous PRN   diphenhydrAMINE 50 mg Oral Nightly PRN   glucose 16 g Oral PRN   haloperidol 2 mg Oral Q8H PRN   Or      haloperidol lactate 2 mg Intramuscular Q8H PRN   Or      haloperidol 2 mg Oral Q8H PRN   LORazepam 1 mg Oral Q4H PRN   Or      LORazepam 1 mg Intramuscular Q4H PRN   Or      LORazepam 1 mg Oral Q4H PRN        Vital Signs: Last Filed Vitals Signs: 24 Hour Range   BP: 102/59 (09/05 0715) BP: (102-111)/(59-67)    Temp: 37.1 ?C (98.8 ?F) (09/05 0715) Temp:  [36.8 ?C (98.2 ?F)-37.1 ?C

## 2016-05-02 NOTE — Psych Progress Note
(  98.8 ?F)]    Pulse: 79 (09/05 0715) Pulse:  [79-97]    Resp: 17 (09/05 0715) Resp:  [16-17]          Respiratory Device: Room Air / (None)               Results for orders placed or performed during the hospital encounter of 04/06/16 (from the past 24 hour(s))   POCT Glucose    Collection Time: 04/08/16 11:23 AM   Result Value    Glucose (Meter) 139 (H)   POCT Glucose    Collection Time: 04/08/16  4:24 PM   Result Value    Glucose (Meter) 233 (H)   POCT Glucose    Collection Time: 04/08/16  8:21 PM   Result Value    Glucose (Meter) 133 (H)   POCT Glucose    Collection Time: 04/09/16  6:24 AM   Result Value    Glucose (Meter) 186 (H)       Body mass index is 23.57 kg/(m^2).      Diagnoses:    Patient Active Problem List   Diagnosis   ? Ingestion of substance         Plan:  Patient is still fragile as evidenced by the assessment of mental status and support system. Patient's, depression, is uncontrolled and the patient lacks adequate judgement to maintain physical safety and inadequate problem-solving skills, posing risk of immediate decompensation if discharged.     debilitating depression,  and I certify that this patient continues to need, on a daily basis, active treatment furnished directly by or requiring the supervision of inpatient psychiatric facility personnel. He is better.      Scribe Attestation: I, Nancee LiterJenna Pierce, have acted as a Neurosurgeonscribe for Dr. Denny PeonSoto-Acosta 10:36 AM 04/09/2016     Physician Attestation: I have reviewed and confirmed the information stated by the scribe and made corrections and edits as appropriate.  I have personally provided the services documented by the scribe.      Sherlynn Carbonaul Soto-Acosta, MD

## 2016-05-02 NOTE — Psych DC Summary
Hemoglobin A1c:  No results found for: HGBA1C    Height: Height: 167.6 cm (5\' 6" )  Weight: Weight: 66.2 kg (146 lb)  BMI: BMI (Calculated): 22.81   PAIN :  0 /10    BP Readings from Last 3 Encounters:   04/10/16 113/55   04/03/16 124/74       Other Relevant Labs Include:    Lab Results   Component Value Date    TSH 0.273 04/06/2016           Restraints use: None    The patient during this hospitalization received a combination of individual, group, and milieu therapy, as well as psychopharmacological treatment. Response to treatment has been good.        Mental Status Examination upon Discharge:  -Appearance: Melvin Ellis is a 50 y.o. yo African-American male in hospital attire.    -Behavior/Eye Contact. cooperative.  -Muscle Strength & Tone/Motor:  normal, without apparent weakness or atrophy. No evidence of abnormal movements, extrapyramidal symptoms or tardive dyskinesia.  -Gait: ambulates without difficulty  -Speech: normal; talkative; appropriate.  -Thought Process: coherent.  -Thought Content: denies any psychotic process (including paranoia, hallucinations or delusions), SI or HI.  -Mood: "  Good.".  -Affect: normal.  -Cognition: oriented to person, place, date, and time.  -Attention/Concentration: alert.  -Memory: remote and recent memory grossly intact.  -Language: no language difficulty reported and speaks fluent English.  -Fund of knowledge: appropriate for level of education.  -Insight/Judgement: normal insight and judgment    At this moment in time Melvin Ellis is not exhibiting any behaviour suggestive of any acute danger to self or others, nor an acute psychotic process. The patient is deemed to be stable enough to be treated on an outpatient basis. The precedent assessment is based on the available information at the time of this discharge.    Final Diagnoses:  ***      Prognosis: fair     Medications Upon Discharge:     Discharge Medications      Notice

## 2016-05-02 NOTE — Psych DC Summary
Hospital Course:  Melvin Ellis is a 50 y.o. male who was admitted to [facility] involuntarily . Please see H&P for further information about the patient's initial presentation. he was started on medications. . Relevant labs were reviewed, and are below.. Pt tolerated all medication changes and showed adequate improvement. he began attending groups and interacted well with others in the ward milieu. Pt began showing improved insight into condition  and the patient was monitored without incident. Pt denies , in a very convincing manner, suicidal or homicidal ideas. Pt reached maximum therapeutic in-patient benefit and the decision was made for discharge.     Initial physical examination was done by Dr. Reece Levy or his associate    Consults:   Consults   Procedures   ? Inpatient consult to Int Med for Psych       Significant Diagnostic Studies:   Labs include:   Metabolic Lab Profile:  Lab Results   Component Value Date/Time    NA 143 04/06/2016 01:59 PM    K 3.9 04/06/2016 01:59 PM    CL 102 04/06/2016 01:59 PM    CO2 28 04/06/2016 01:59 PM    BUN 11 04/06/2016 01:59 PM    CREATININE 1.03 04/06/2016 01:59 PM    GLU 163 (H) 04/06/2016 01:59 PM    CALCIUM 10.1 (H) 04/06/2016 01:59 PM    TPROT 7.0 04/06/2016 01:59 PM    ALB 3.7 (L) 04/06/2016 01:59 PM    AST 11 (L) 04/06/2016 01:59 PM    ALT 21 04/06/2016 01:59 PM    EGFR >59 04/06/2016 01:59 PM    TBILI 0.1 (L) 04/06/2016 01:59 PM    ALKPHOS 64 04/06/2016 01:59 PM       CBC (with or without Differential):   Lab Results   Component Value Date    WBC 7.58 04/06/2016    HGB 11.0 (L) 04/06/2016    HCT 34.4 (L) 04/06/2016    MCV 84.1 04/06/2016    MCH 26.9 (L) 04/06/2016    MCHC 32.0 04/06/2016    RDW 13.4 04/06/2016    PLATCOUNT 351 04/06/2016    MPV 10.5 04/06/2016    NEUTROPCT 58.3 04/06/2016    MONOPCT 15.0 (H) 04/06/2016    EOSPCT 1.8 04/06/2016    BASOPCT 0.7 04/06/2016       Fasting Lipid Profile:  No results found for: CHOL, HDL, LDL, TRIG, CHOLHDL

## 2016-05-02 NOTE — Discharge Summary
Hospital Course:  Melvin Ellis is a 50 y.o. male who was admitted to [facility] involuntarily . Please see H&P for further information about the patient's initial presentation. he was started on medications. . Relevant labs were reviewed, and are below.. Pt tolerated all medication changes and showed adequate improvement. he began attending groups and interacted well with others in the ward milieu. Pt began showing improved insight into condition  and the patient was monitored without incident. Pt denies , in a very convincing manner, suicidal or homicidal ideas. Pt reached maximum therapeutic in-patient benefit and the decision was made for discharge.     Patient was going to be discharged home yesterday, but things did not work out for living arrangements until today      Initial physical examination was done by Dr. Reece Levy or his associate    Consults:   Consults   Procedures   ? Inpatient consult to Int Med for Psych       Significant Diagnostic Studies:   Labs include:   Metabolic Lab Profile:  Lab Results   Component Value Date/Time    NA 143 04/06/2016 01:59 PM    K 3.9 04/06/2016 01:59 PM    CL 102 04/06/2016 01:59 PM    CO2 28 04/06/2016 01:59 PM    BUN 11 04/06/2016 01:59 PM    CREATININE 1.03 04/06/2016 01:59 PM    GLU 163 (H) 04/06/2016 01:59 PM    CALCIUM 10.1 (H) 04/06/2016 01:59 PM    TPROT 7.0 04/06/2016 01:59 PM    ALB 3.7 (L) 04/06/2016 01:59 PM    AST 11 (L) 04/06/2016 01:59 PM    ALT 21 04/06/2016 01:59 PM    EGFR >59 04/06/2016 01:59 PM    TBILI 0.1 (L) 04/06/2016 01:59 PM    ALKPHOS 64 04/06/2016 01:59 PM       CBC (with or without Differential):   Lab Results   Component Value Date    WBC 7.58 04/06/2016    HGB 11.0 (L) 04/06/2016    HCT 34.4 (L) 04/06/2016    MCV 84.1 04/06/2016    MCH 26.9 (L) 04/06/2016    MCHC 32.0 04/06/2016    RDW 13.4 04/06/2016    PLATCOUNT 351 04/06/2016    MPV 10.5 04/06/2016    NEUTROPCT 58.3 04/06/2016    MONOPCT 15.0 (H) 04/06/2016

## 2016-05-02 NOTE — Discharge Summary
Physician Attestation: I have reviewed and confirmed the information stated by the scribe and made corrections and edits as appropriate.  I have personally provided the services documented by the scribe.      Raul Soto-Acosta, MD

## 2016-05-02 NOTE — Discharge Summary
[facility]  Inpatient Psychiatry  [place], [place]      DISCHARGE SUMMARY    MRN: [id number]  Patient Name: Melvin Ellis   Date of birth: 09/20/1965  Date of Admission: 04/06/2016  Date of Discharge:  04/11/2016  Admitting Physician: Sherlynn Carbonaul Soto-Acosta, MD   Discharge Physician: Sherlynn Carbonaul Soto-Acosta, MD      Admission Condition: poor    Discharged Condition: fair  Pt was discharged in a improved condition.    Indication for Admission: suicide attempt/gesture/ideation.    Reason for Admission: Melvin FlemingsOscar Noller is a 50 y.o. male was seen while in his room and presents agitated. He is here under a Engineer, production[employer]aker Act dated 04/06/2016 that says "pt endorsing suicidal ideations with plan to walk out into oncoming traffic." When asked why he was here he states "I'm having suicidal thoughts and I want to get help." At this time pt admits to both SI with plan to walk into traffic and AH. He states that he has been hearing voices for years now. Pt also discusses that "he is tired of being in and out of shelters." He admits to cocaine use, but states "that's not important, I want these voices to go away." Pt exhibited irritability and was dysphoric at this time. Pt states that he has no family and hasn't been able to work. PMHx includes DM and states that "he needs insulin to help." Pt also discusses that he "has a learning disability and can only read/write on a 3rd grade level." Pt reports poor sleep, good appetite, and good energy at this time.  ?    Admission Diagnoses: Schizophrenia [F20.9]  Diabetes mellitus type 2 in nonobese [E11.9]  Tobacco use [Z72.0]  Schizophrenia, unspecified type [F20.9]  Ingestion of substance, undetermined intent, initial encounter [T65.94XA]    No prescriptions prior to admission.           Inpatient Course of Stay:    Treatment Rendered: Medication Management  Psychotherapy  Recreational Therapy  Social Service/DC Planning

## 2016-05-02 NOTE — Discharge Summary
Schizophrenia spectrum disorder with psychotic disorder       Prognosis: fair     Medications Upon Discharge:     Discharge Medications      Active Meds       Sig    risperiDONE 1 MG Tabs   Commonly known as:  RisperDAL    1 mg, Oral, Nightly   Quantity:  30 tablet       sertraline 50 MG Tabs   Commonly known as:  ZOLOFT    50 mg, Oral, daily   Quantity:  30 tablet         Modified Prior to Admission Meds       Sig    HUMALOG KWIKPEN 100 UNIT/ML Sopn   Generic drug:  insulin lispro   What changed:    - how much to take  - how to take this  - when to take this    5 Units, Subcutaneous, TID AC   Quantity:  1 pen       LEVEMIR FlexTouch 100 UNIT/ML Sopn   Generic drug:  insulin detemir   What changed:    - how much to take  - how to take this  - when to take this    18 Units, Subcutaneous, QAM   Quantity:  1 pen         Continued Prior to Admission Meds       Sig    folic acid 1 MG Tabs    No dose, route, or frequency recorded.       thiamine 100 MG Tabs   Commonly known as:  VITAMIN B-1    No dose, route, or frequency recorded.       ULTICARE SHORT PEN NEEDLES 31G X 8 MM   Generic drug:  Insulin Pen Needle    No dose, route, or frequency recorded.         Stopped Meds          perphenazine 4 MG Tabs   Commonly known as:  TRILAFON       UNKNOWN TO PATIENT               Discharge Orders  Diet carb consistent     For patients with heart or kidney problems: Measure weight daily   If I have heart or kidney problems, I will record my daily weight and call my physician to report a gain of 3 lbs or more in 5 days.       Activity as tolerated     Symptoms to monitor after discharge   For any questions or concerns and to report any of the following symptoms/problems: Worsening of current symptoms, Chest pain, Shortness of breath, Dizziness, Swelling in ankles or legs, Pain uncontrolled by medication, Drainage or foul odor from incision, Extensive bruising or discoloration, Nausea/vomiting, Elevated temperature. Notify your

## 2016-05-02 NOTE — Discharge Summary
symptoms/problems: Worsening of current symptoms, Chest pain, Shortness of breath, Dizziness, Swelling in ankles or legs, Pain uncontrolled by medication, Drainage or foul odor from incision, Extensive bruising or discoloration, Nausea/vomiting, Elevated temperature. Notify your physician.     Smoking cessation   If you smoke, stop.  If you don't smoke, do not start.  Avoid second hand smoke.  For help call the toll-free QuitLine at 607-546-64331-920-811-4051.  Follow up with Primary care Physician within seven days    Contact National Alliance on Mental Illness (www. nami.org)  [place] [facility] Chapter is  www.[place].nami.org   Phone 613 412 2744(904) 601-078-6292    Call 911 if you have active thoughts of harming yourself or others, if you have made a suicide attempt or if you experience severe medical problems  Call your OUT patient doctor or therapist if you experience worsening of your symptoms.    SUICIDE Prevention Hotline is 1-800-273- TALK (8255)    Advise Total Sobriety, to call for help call 211 or [phone](904) [phone] (FIRST CALL)  Advise Smoking Cessation     WARFARIN (Coumadin) Education is NOT Required     The orders/medications listed here were reviewed, ordered and electronically signed by:   Sherlynn Carbonaul Soto-Acosta, MD          Recommendations:  Linton FlemingsOscar Heath is going to be discharged to the community and he is going to be [facility] to [facility] for outpatient psychiatric followup. The patient was given information and about the [facility] [employer] and he had been made aware of their availability of the emergency evaluation services 24 hours a day, 7 days a week , as well as 911. The patient has been advised to follow up with the Primary Care Physician for any physical concerns.Discontinue alcohol use and Discontinue tobacco use. Total sobriety was also advised.          Scribe Attestation: I, [name], have acted as a Neurosurgeonscribe for Dr. Denny PeonSoto-Acosta 10:55 AM 04/11/2016

## 2016-05-02 NOTE — Discharge Summary
EOSPCT 1.8 04/06/2016    BASOPCT 0.7 04/06/2016       Fasting Lipid Profile:  No results found for: CHOL, HDL, LDL, TRIG, CHOLHDL    Hemoglobin A1c:  No results found for: HGBA1C    Height: Height: 167.6 cm (5\' 6" )  Weight: Weight: 66.2 kg (146 lb)  BMI: BMI (Calculated): 22.81   PAIN :  0 /10    BP Readings from Last 3 Encounters:   04/11/16 104/55   04/03/16 124/74       Other Relevant Labs Include:    Lab Results   Component Value Date    TSH 0.273 04/06/2016           Restraints use: None    The patient during this hospitalization received a combination of individual, group, and milieu therapy, as well as psychopharmacological treatment. Response to treatment has been good.        Mental Status Examination upon Discharge:  -Appearance: Melvin Ellis is a 50 y.o. yo African-American male  hospital attire.    -Behavior/Eye Contact. cooperative.  -Muscle Strength & Tone/Motor:  normal, without apparent weakness or atrophy. No evidence of abnormal movements, extrapyramidal symptoms or tardive dyskinesia.  -Gait: ambulates without difficulty  -Speech: normal; talkative; appropriate.  -Thought Process: coherent.  -Thought Content: denies any psychotic process (including paranoia, hallucinations or delusions), SI or HI.  -Mood: "  Good.".  -Affect: normal.  -Cognition: oriented to person, place, date, and time.  -Attention/Concentration: alert.  -Memory: remote and recent memory grossly intact.  -Language: no language difficulty reported and speaks fluent English.  -Fund of knowledge: appropriate for level of education.  -Insight/Judgement: normal insight and judgment    At this moment in time Melvin Ellis is not exhibiting any behaviour suggestive of any acute danger to self or others, nor an acute psychotic process. The patient is deemed to be stable enough to be treated on an outpatient basis. The precedent assessment is based on the available information at the time of this discharge.    Final Diagnoses:

## 2016-05-02 NOTE — Psych DC Summary
You have not been prescribed any medications.          No discharge procedures on file.     Recommendations:  Melvin Ellis is going to be discharged to *** and he is going to be referred to *** for outpatient psychiatric followup. The patient was given information and about the [facility] [employer] and he had been made aware of their availability of the emergency evaluation services 24 hours a day, 7 days a week , as well as 911. The patient has been advised to follow up with the Primary Care Physician for any physical concerns.Discontinue alcohol use. Total sobriety was also advised.          Scribe Attestation: I, [name], have acted as a Neurosurgeonscribe for Dr. Denny PeonSoto-Acosta 12:36 PM 04/10/2016     Physician Attestation: Marland Kitchen***

## 2016-11-13 IMAGING — CR DG CHEST 2V
2 series · 2 of 2 positions shown · non-contrast
Comparison: None.

CLINICAL DATA: Cough and cold like symptoms for 1 week. No chest
pain. History of diabetes. Smoker.

EXAM:
CHEST  2 VIEW

[w chest pa]
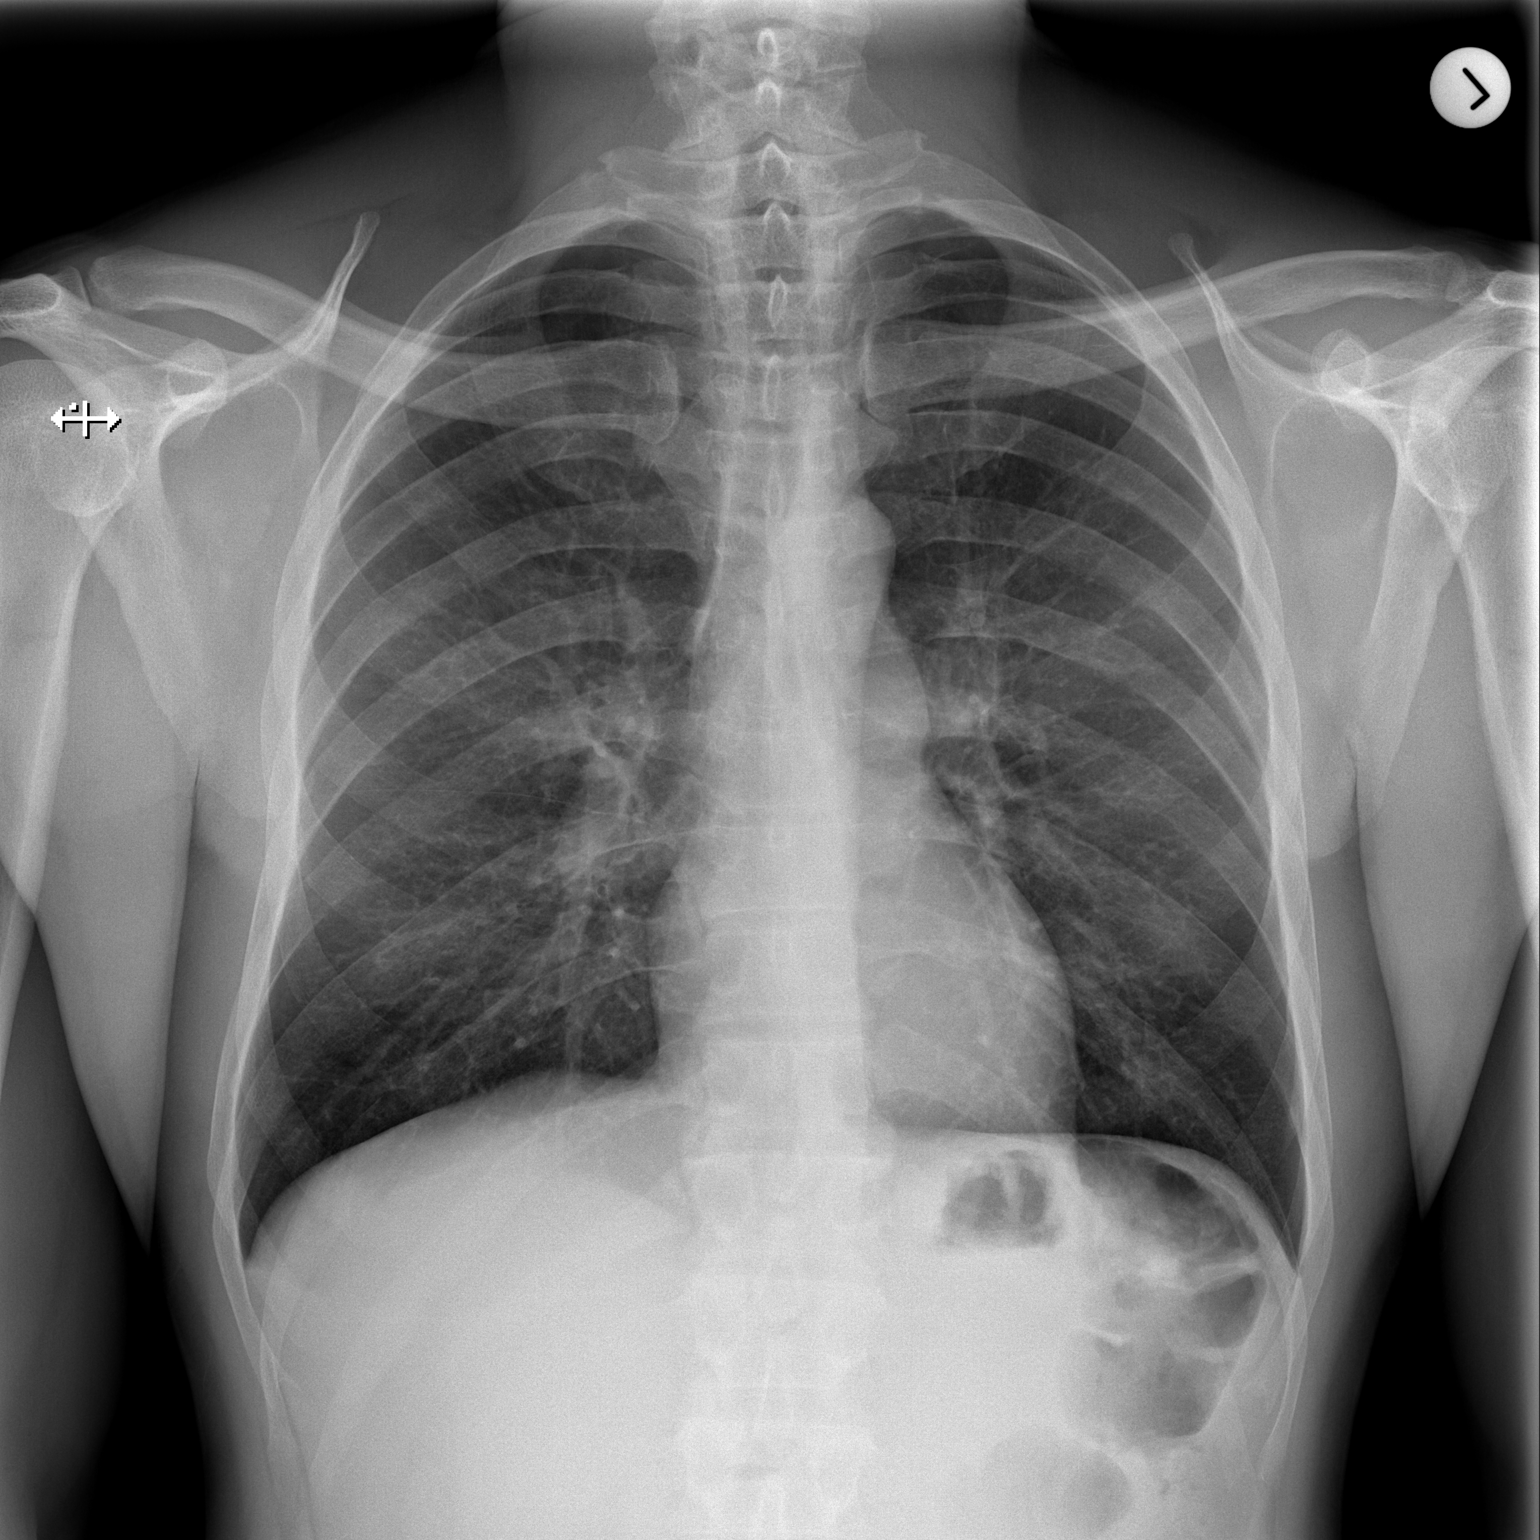

[w chest lat]
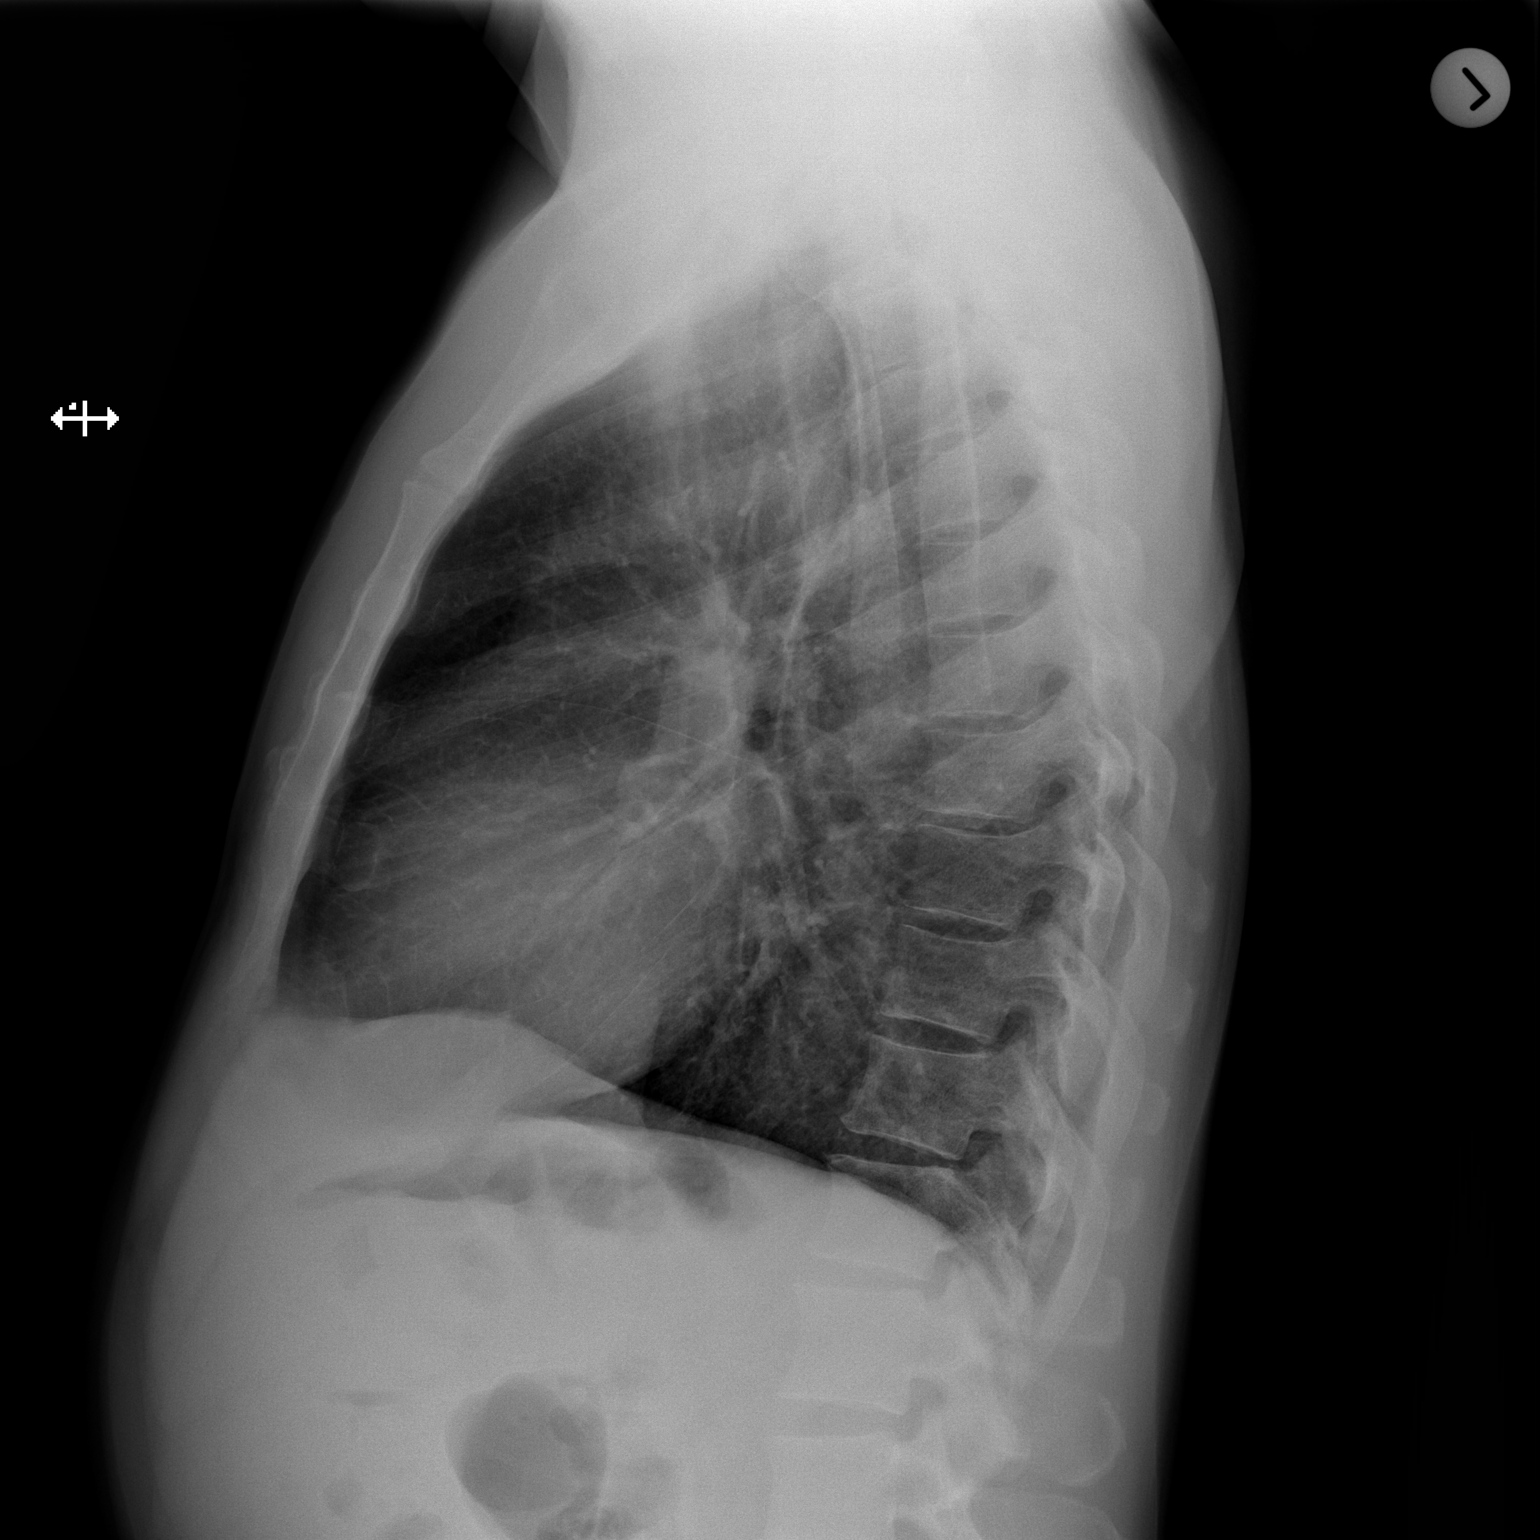

[2 of 2 positions shown; findings below may reference images not displayed]

FINDINGS: The heart size and mediastinal contours are within normal limits.
Both lungs are clear. Increased perihilar markings consistent with
chronic bronchitis. No hyperinflation. No osseous findings.
IMPRESSION: Chronic bronchitic change.  No active infiltrates are noted.

## 2019-06-06 DEATH — deceased
